# Patient Record
Sex: Female | Born: 1960 | Race: Black or African American | Hispanic: No | Marital: Married | Smoking: Current every day smoker
Health system: Southern US, Community
[De-identification: ages and names within clinical notes are randomized; demographics above are authoritative.]

## PROBLEM LIST (undated history)

## (undated) DIAGNOSIS — Z8489 Family history of other specified conditions: Secondary | ICD-10-CM

## (undated) DIAGNOSIS — K760 Fatty (change of) liver, not elsewhere classified: Secondary | ICD-10-CM

## (undated) DIAGNOSIS — D649 Anemia, unspecified: Secondary | ICD-10-CM

## (undated) DIAGNOSIS — K219 Gastro-esophageal reflux disease without esophagitis: Secondary | ICD-10-CM

## (undated) DIAGNOSIS — G4730 Sleep apnea, unspecified: Secondary | ICD-10-CM

## (undated) DIAGNOSIS — M1990 Unspecified osteoarthritis, unspecified site: Secondary | ICD-10-CM

## (undated) DIAGNOSIS — I10 Essential (primary) hypertension: Secondary | ICD-10-CM

## (undated) DIAGNOSIS — T8859XA Other complications of anesthesia, initial encounter: Secondary | ICD-10-CM

---

## 2005-10-30 ENCOUNTER — Ambulatory Visit: Payer: Self-pay | Admitting: Family Medicine

## 2011-03-03 ENCOUNTER — Emergency Department: Payer: Self-pay | Admitting: Emergency Medicine

## 2013-01-24 ENCOUNTER — Emergency Department: Payer: Self-pay | Admitting: Emergency Medicine

## 2014-07-07 ENCOUNTER — Emergency Department: Payer: Self-pay | Admitting: Emergency Medicine

## 2014-09-09 ENCOUNTER — Emergency Department: Payer: Self-pay | Admitting: Emergency Medicine

## 2014-09-09 LAB — URINALYSIS, COMPLETE
BLOOD: NEGATIVE
Bacteria: NONE SEEN
Bilirubin,UR: NEGATIVE
Glucose,UR: NEGATIVE mg/dL (ref 0–75)
Leukocyte Esterase: NEGATIVE
NITRITE: NEGATIVE
PH: 5 (ref 4.5–8.0)
PROTEIN: NEGATIVE
RBC,UR: 1 /HPF (ref 0–5)
Specific Gravity: 1.016 (ref 1.003–1.030)
Squamous Epithelial: 1

## 2014-09-09 LAB — COMPREHENSIVE METABOLIC PANEL
ALBUMIN: 3.8 g/dL (ref 3.4–5.0)
Alkaline Phosphatase: 101 U/L
Anion Gap: 6 — ABNORMAL LOW (ref 7–16)
BUN: 25 mg/dL — AB (ref 7–18)
Bilirubin,Total: 0.6 mg/dL (ref 0.2–1.0)
CALCIUM: 9.6 mg/dL (ref 8.5–10.1)
Chloride: 99 mmol/L (ref 98–107)
Co2: 30 mmol/L (ref 21–32)
Creatinine: 0.96 mg/dL (ref 0.60–1.30)
GLUCOSE: 141 mg/dL — AB (ref 65–99)
OSMOLALITY: 277 (ref 275–301)
POTASSIUM: 3.6 mmol/L (ref 3.5–5.1)
SGOT(AST): 40 U/L — ABNORMAL HIGH (ref 15–37)
SGPT (ALT): 28 U/L
Sodium: 135 mmol/L — ABNORMAL LOW (ref 136–145)
TOTAL PROTEIN: 8 g/dL (ref 6.4–8.2)

## 2014-09-09 LAB — CBC
HCT: 33.7 % — ABNORMAL LOW (ref 35.0–47.0)
HGB: 11.4 g/dL — AB (ref 12.0–16.0)
MCH: 36.7 pg — AB (ref 26.0–34.0)
MCHC: 33.9 g/dL (ref 32.0–36.0)
MCV: 108 fL — AB (ref 80–100)
Platelet: 213 10*3/uL (ref 150–440)
RBC: 3.11 10*6/uL — AB (ref 3.80–5.20)
RDW: 12.7 % (ref 11.5–14.5)
WBC: 7.6 10*3/uL (ref 3.6–11.0)

## 2014-09-09 LAB — HEMOGLOBIN: HGB: 11.2 g/dL — ABNORMAL LOW (ref 12.0–16.0)

## 2016-03-07 ENCOUNTER — Other Ambulatory Visit: Payer: Self-pay | Admitting: Family Medicine

## 2016-03-07 DIAGNOSIS — R319 Hematuria, unspecified: Secondary | ICD-10-CM

## 2016-03-19 ENCOUNTER — Ambulatory Visit: Admission: RE | Admit: 2016-03-19 | Payer: Federal, State, Local not specified - PPO | Source: Ambulatory Visit

## 2016-03-27 ENCOUNTER — Ambulatory Visit
Admission: RE | Admit: 2016-03-27 | Discharge: 2016-03-27 | Disposition: A | Discharge status: Home / Self Care | Payer: Federal, State, Local not specified - PPO | Source: Ambulatory Visit | Attending: Family Medicine | Admitting: Family Medicine

## 2016-03-27 DIAGNOSIS — R160 Hepatomegaly, not elsewhere classified: Secondary | ICD-10-CM | POA: Insufficient documentation

## 2016-03-27 DIAGNOSIS — I700 Atherosclerosis of aorta: Secondary | ICD-10-CM | POA: Diagnosis not present

## 2016-03-27 DIAGNOSIS — R319 Hematuria, unspecified: Secondary | ICD-10-CM | POA: Diagnosis not present

## 2016-03-27 DIAGNOSIS — I2510 Atherosclerotic heart disease of native coronary artery without angina pectoris: Secondary | ICD-10-CM | POA: Diagnosis not present

## 2016-03-27 DIAGNOSIS — K760 Fatty (change of) liver, not elsewhere classified: Secondary | ICD-10-CM | POA: Diagnosis not present

## 2016-03-27 DIAGNOSIS — K8020 Calculus of gallbladder without cholecystitis without obstruction: Secondary | ICD-10-CM | POA: Diagnosis not present

## 2016-03-27 DIAGNOSIS — D3502 Benign neoplasm of left adrenal gland: Secondary | ICD-10-CM | POA: Insufficient documentation

## 2016-03-27 HISTORY — DX: Essential (primary) hypertension: I10

## 2016-03-27 MED ORDER — IOPAMIDOL (ISOVUE-300) INJECTION 61%
125.0000 mL | Freq: Once | INTRAVENOUS | Status: AC | PRN
  Administered 2016-03-27: 125 mL via INTRAVENOUS

## 2020-06-27 ENCOUNTER — Emergency Department: Payer: Federal, State, Local not specified - PPO

## 2020-06-27 ENCOUNTER — Encounter: Payer: Self-pay | Admitting: Emergency Medicine

## 2020-06-27 ENCOUNTER — Inpatient Hospital Stay
Admission: EM | Admit: 2020-06-27 | Discharge: 2020-07-07 | DRG: 871 | Disposition: A | Discharge status: Home Health | Payer: Federal, State, Local not specified - PPO | Attending: Internal Medicine | Admitting: Internal Medicine

## 2020-06-27 ENCOUNTER — Other Ambulatory Visit: Payer: Self-pay

## 2020-06-27 DIAGNOSIS — J9600 Acute respiratory failure, unspecified whether with hypoxia or hypercapnia: Secondary | ICD-10-CM

## 2020-06-27 DIAGNOSIS — E876 Hypokalemia: Secondary | ICD-10-CM | POA: Diagnosis not present

## 2020-06-27 DIAGNOSIS — K831 Obstruction of bile duct: Secondary | ICD-10-CM

## 2020-06-27 DIAGNOSIS — A4151 Sepsis due to Escherichia coli [E. coli]: Secondary | ICD-10-CM | POA: Diagnosis present

## 2020-06-27 DIAGNOSIS — K819 Cholecystitis, unspecified: Secondary | ICD-10-CM

## 2020-06-27 DIAGNOSIS — D6959 Other secondary thrombocytopenia: Secondary | ICD-10-CM | POA: Diagnosis present

## 2020-06-27 DIAGNOSIS — I10 Essential (primary) hypertension: Secondary | ICD-10-CM | POA: Diagnosis present

## 2020-06-27 DIAGNOSIS — E669 Obesity, unspecified: Secondary | ICD-10-CM | POA: Diagnosis present

## 2020-06-27 DIAGNOSIS — Z6839 Body mass index (BMI) 39.0-39.9, adult: Secondary | ICD-10-CM

## 2020-06-27 DIAGNOSIS — N2581 Secondary hyperparathyroidism of renal origin: Secondary | ICD-10-CM | POA: Diagnosis present

## 2020-06-27 DIAGNOSIS — R5381 Other malaise: Secondary | ICD-10-CM | POA: Diagnosis present

## 2020-06-27 DIAGNOSIS — N170 Acute kidney failure with tubular necrosis: Secondary | ICD-10-CM

## 2020-06-27 DIAGNOSIS — K760 Fatty (change of) liver, not elsewhere classified: Secondary | ICD-10-CM | POA: Diagnosis present

## 2020-06-27 DIAGNOSIS — E871 Hypo-osmolality and hyponatremia: Secondary | ICD-10-CM | POA: Diagnosis not present

## 2020-06-27 DIAGNOSIS — E785 Hyperlipidemia, unspecified: Secondary | ICD-10-CM | POA: Diagnosis present

## 2020-06-27 DIAGNOSIS — E872 Acidosis: Secondary | ICD-10-CM | POA: Diagnosis present

## 2020-06-27 DIAGNOSIS — R34 Anuria and oliguria: Secondary | ICD-10-CM | POA: Diagnosis not present

## 2020-06-27 DIAGNOSIS — I361 Nonrheumatic tricuspid (valve) insufficiency: Secondary | ICD-10-CM | POA: Diagnosis not present

## 2020-06-27 DIAGNOSIS — R627 Adult failure to thrive: Secondary | ICD-10-CM | POA: Diagnosis present

## 2020-06-27 DIAGNOSIS — D6489 Other specified anemias: Secondary | ICD-10-CM | POA: Diagnosis not present

## 2020-06-27 DIAGNOSIS — A4159 Other Gram-negative sepsis: Secondary | ICD-10-CM | POA: Diagnosis present

## 2020-06-27 DIAGNOSIS — Z20822 Contact with and (suspected) exposure to covid-19: Secondary | ICD-10-CM | POA: Diagnosis present

## 2020-06-27 DIAGNOSIS — R6521 Severe sepsis with septic shock: Secondary | ICD-10-CM | POA: Diagnosis present

## 2020-06-27 DIAGNOSIS — E2740 Unspecified adrenocortical insufficiency: Secondary | ICD-10-CM | POA: Diagnosis present

## 2020-06-27 DIAGNOSIS — R739 Hyperglycemia, unspecified: Secondary | ICD-10-CM | POA: Diagnosis not present

## 2020-06-27 DIAGNOSIS — R17 Unspecified jaundice: Secondary | ICD-10-CM

## 2020-06-27 DIAGNOSIS — T380X5A Adverse effect of glucocorticoids and synthetic analogues, initial encounter: Secondary | ICD-10-CM | POA: Diagnosis not present

## 2020-06-27 DIAGNOSIS — I351 Nonrheumatic aortic (valve) insufficiency: Secondary | ICD-10-CM | POA: Diagnosis not present

## 2020-06-27 DIAGNOSIS — R6520 Severe sepsis without septic shock: Secondary | ICD-10-CM | POA: Diagnosis not present

## 2020-06-27 DIAGNOSIS — Z9884 Bariatric surgery status: Secondary | ICD-10-CM | POA: Diagnosis not present

## 2020-06-27 DIAGNOSIS — K8012 Calculus of gallbladder with acute and chronic cholecystitis without obstruction: Secondary | ICD-10-CM | POA: Diagnosis present

## 2020-06-27 DIAGNOSIS — I340 Nonrheumatic mitral (valve) insufficiency: Secondary | ICD-10-CM | POA: Diagnosis not present

## 2020-06-27 DIAGNOSIS — Z7189 Other specified counseling: Secondary | ICD-10-CM | POA: Diagnosis not present

## 2020-06-27 DIAGNOSIS — Z515 Encounter for palliative care: Secondary | ICD-10-CM | POA: Diagnosis not present

## 2020-06-27 DIAGNOSIS — R7881 Bacteremia: Secondary | ICD-10-CM | POA: Diagnosis not present

## 2020-06-27 DIAGNOSIS — N179 Acute kidney failure, unspecified: Secondary | ICD-10-CM

## 2020-06-27 DIAGNOSIS — Z23 Encounter for immunization: Secondary | ICD-10-CM | POA: Diagnosis not present

## 2020-06-27 DIAGNOSIS — A419 Sepsis, unspecified organism: Secondary | ICD-10-CM | POA: Diagnosis not present

## 2020-06-27 DIAGNOSIS — E782 Mixed hyperlipidemia: Secondary | ICD-10-CM | POA: Diagnosis not present

## 2020-06-27 DIAGNOSIS — K810 Acute cholecystitis: Secondary | ICD-10-CM

## 2020-06-27 LAB — APTT: aPTT: 23 seconds — ABNORMAL LOW (ref 24–36)

## 2020-06-27 LAB — COMPREHENSIVE METABOLIC PANEL
ALT: 731 U/L — ABNORMAL HIGH (ref 0–44)
AST: 6003 U/L — ABNORMAL HIGH (ref 15–41)
Albumin: 3.3 g/dL — ABNORMAL LOW (ref 3.5–5.0)
Alkaline Phosphatase: 329 U/L — ABNORMAL HIGH (ref 38–126)
Anion gap: 16 — ABNORMAL HIGH (ref 5–15)
BUN: 16 mg/dL (ref 8–23)
CO2: 19 mmol/L — ABNORMAL LOW (ref 22–32)
Calcium: 8.2 mg/dL — ABNORMAL LOW (ref 8.9–10.3)
Chloride: 103 mmol/L (ref 98–111)
Creatinine, Ser: 1.88 mg/dL — ABNORMAL HIGH (ref 0.44–1.00)
GFR, Estimated: 28 mL/min — ABNORMAL LOW (ref >60)
Glucose, Bld: 82 mg/dL (ref 70–99)
Potassium: 4.6 mmol/L (ref 3.5–5.1)
Sodium: 138 mmol/L (ref 135–145)
Total Bilirubin: 1.9 mg/dL — ABNORMAL HIGH (ref 0.3–1.2)
Total Protein: 6.5 g/dL (ref 6.5–8.1)

## 2020-06-27 LAB — FIBRIN DERIVATIVES D-DIMER (ARMC ONLY): Fibrin derivatives D-dimer (ARMC): 3979.26 ng/mL (FEU) — ABNORMAL HIGH (ref 0.00–499.00)

## 2020-06-27 LAB — LACTIC ACID, PLASMA
Lactic Acid, Venous: 5.2 mmol/L (ref 0.5–1.9)
Lactic Acid, Venous: 6.6 mmol/L (ref 0.5–1.9)

## 2020-06-27 LAB — DIFFERENTIAL
Abs Immature Granulocytes: 0.2 10*3/uL — ABNORMAL HIGH (ref 0.00–0.07)
Basophils Absolute: 0 10*3/uL (ref 0.0–0.1)
Basophils Relative: 1 %
Eosinophils Absolute: 0 10*3/uL (ref 0.0–0.5)
Eosinophils Relative: 0 %
Immature Granulocytes: 19 %
Lymphocytes Relative: 11 %
Lymphs Abs: 0.1 10*3/uL — ABNORMAL LOW (ref 0.7–4.0)
Monocytes Absolute: 0.1 10*3/uL (ref 0.1–1.0)
Monocytes Relative: 5 %
Neutro Abs: 0.7 10*3/uL — ABNORMAL LOW (ref 1.7–7.7)
Neutrophils Relative %: 64 %

## 2020-06-27 LAB — LIPASE, BLOOD: Lipase: 30 U/L (ref 11–51)

## 2020-06-27 LAB — RESPIRATORY PANEL BY RT PCR (FLU A&B, COVID)
Influenza A by PCR: NEGATIVE
Influenza B by PCR: NEGATIVE
SARS Coronavirus 2 by RT PCR: NEGATIVE

## 2020-06-27 LAB — CBC
HCT: 31.7 % — ABNORMAL LOW (ref 36.0–46.0)
Hemoglobin: 10.9 g/dL — ABNORMAL LOW (ref 12.0–15.0)
MCH: 36.3 pg — ABNORMAL HIGH (ref 26.0–34.0)
MCHC: 34.4 g/dL (ref 30.0–36.0)
MCV: 105.7 fL — ABNORMAL HIGH (ref 80.0–100.0)
Platelets: 191 10*3/uL (ref 150–400)
RBC: 3 MIL/uL — ABNORMAL LOW (ref 3.87–5.11)
RDW: 14.8 % (ref 11.5–15.5)
WBC: 1.1 10*3/uL — CL (ref 4.0–10.5)
nRBC: 1.9 % — ABNORMAL HIGH (ref 0.0–0.2)

## 2020-06-27 LAB — PROTIME-INR
INR: 1.1 (ref 0.8–1.2)
Prothrombin Time: 14.1 seconds (ref 11.4–15.2)

## 2020-06-27 MED ORDER — SODIUM CHLORIDE 0.9 % IV SOLN
250.0000 mL | INTRAVENOUS | Status: DC
  Administered 2020-06-27 – 2020-07-05 (×4): 250 mL via INTRAVENOUS

## 2020-06-27 MED ORDER — POLYETHYLENE GLYCOL 3350 17 G PO PACK: 17.0000 g | PACK | Freq: Every day | ORAL | Status: DC | PRN

## 2020-06-27 MED ORDER — LACTATED RINGERS IV BOLUS
1000.0000 mL | Freq: Once | INTRAVENOUS | Status: AC
  Administered 2020-06-27: 1000 mL via INTRAVENOUS

## 2020-06-27 MED ORDER — ONDANSETRON HCL 4 MG/2ML IJ SOLN
4.0000 mg | Freq: Four times a day (QID) | INTRAMUSCULAR | Status: DC | PRN
  Administered 2020-06-28: 4 mg via INTRAVENOUS
  Filled 2020-06-27: qty 2

## 2020-06-27 MED ORDER — PIPERACILLIN-TAZOBACTAM 3.375 G IVPB
3.3750 g | Freq: Three times a day (TID) | INTRAVENOUS | Status: DC
  Administered 2020-06-28 – 2020-06-30 (×8): 3.375 g via INTRAVENOUS
  Filled 2020-06-27 (×8): qty 50

## 2020-06-27 MED ORDER — PANTOPRAZOLE SODIUM 40 MG IV SOLR
40.0000 mg | Freq: Every day | INTRAVENOUS | Status: DC
  Administered 2020-06-28 – 2020-07-03 (×7): 40 mg via INTRAVENOUS
  Filled 2020-06-27 (×7): qty 40

## 2020-06-27 MED ORDER — LACTATED RINGERS IV BOLUS
1500.0000 mL | Freq: Once | INTRAVENOUS | Status: AC
  Administered 2020-06-27: 1500 mL via INTRAVENOUS

## 2020-06-27 MED ORDER — ALBUMIN HUMAN 25 % IV SOLN
25.0000 g | Freq: Once | INTRAVENOUS | Status: AC
  Administered 2020-06-28: 25 g via INTRAVENOUS
  Filled 2020-06-27: qty 100

## 2020-06-27 MED ORDER — NOREPINEPHRINE 4 MG/250ML-% IV SOLN
2.0000 ug/min | INTRAVENOUS | Status: DC
  Administered 2020-06-27: 2 ug/min via INTRAVENOUS
  Filled 2020-06-27 (×2): qty 250

## 2020-06-27 MED ORDER — DOCUSATE SODIUM 100 MG PO CAPS: 100.0000 mg | ORAL_CAPSULE | Freq: Two times a day (BID) | ORAL | Status: DC | PRN

## 2020-06-27 MED ORDER — SODIUM CHLORIDE 0.9 % IV SOLN
2.0000 g | Freq: Once | INTRAVENOUS | Status: AC
  Administered 2020-06-27: 2 g via INTRAVENOUS
  Filled 2020-06-27: qty 20

## 2020-06-27 MED ORDER — METRONIDAZOLE IN NACL 5-0.79 MG/ML-% IV SOLN
500.0000 mg | Freq: Once | INTRAVENOUS | Status: AC
  Administered 2020-06-27: 500 mg via INTRAVENOUS
  Filled 2020-06-27: qty 100

## 2020-06-27 MED ORDER — LACTATED RINGERS IV SOLN: INTRAVENOUS | Status: DC

## 2020-06-27 MED ORDER — LACTATED RINGERS IV BOLUS
1000.0000 mL | Freq: Once | INTRAVENOUS | Status: AC
  Administered 2020-06-28: 1000 mL via INTRAVENOUS

## 2020-06-27 MED ORDER — IOHEXOL 300 MG/ML  SOLN
75.0000 mL | Freq: Once | INTRAMUSCULAR | Status: AC | PRN
  Administered 2020-06-27: 75 mL via INTRAVENOUS

## 2020-06-27 NOTE — ED Notes (Signed)
US at bedside

## 2020-06-27 NOTE — ED Provider Notes (Signed)
[facility] Emergency Department Provider Note  ____________________________________________  Time seen: Approximately 7:08 PM  I have reviewed the triage vital signs and the nursing notes.   HISTORY  Chief Complaint Dizziness    Level 5 Caveat: Portions of the History and Physical including HPI and review of systems are unable to be completely obtained due to patient being a poor historian   HPI Carrie Hardy is a 59 y.o. female with a history of hypertension who reports being in her usual state of health last night at bedtime and then waking up this morning feeling lightheaded.  Found to have a low blood pressure of about 70/50.  She denies any recent pain, denies vomiting or diarrhea, reports eating and drinking normally.  Lightheadedness has caused her to feel like she was going to pass out and fall, but denies any trauma.  This morning she was also having slurred speech which has now resolved.  Denies any vision changes headache paresthesias or lateralizing weakness.      Past Medical History:  Diagnosis Date  . Hypertension      There are no problems to display for this patient.    History reviewed. No pertinent surgical history.   Prior to Admission medications   Not on File     Allergies Tramadol   No family history on file.  Social History Social History   Tobacco Use  . Smoking status: Never Smoker  . Smokeless tobacco: Never Used  Substance Use Topics  . Alcohol use: Not Currently  . Drug use: Not Currently    Review of Systems Level 5 Caveat: Portions of the History and Physical including HPI and review of systems are unable to be completely obtained due to patient being a poor historian   Constitutional:   No known fever.  ENT:   No rhinorrhea. Cardiovascular:   No chest pain or syncope. Respiratory:   No dyspnea or cough. Gastrointestinal:   Negative for abdominal pain, vomiting and diarrhea.   Musculoskeletal:   Negative for focal pain or swelling ____________________________________________   PHYSICAL EXAM:  VITAL SIGNS: ED Triage Vitals  Enc Vitals Group     BP 06/27/20 1752 (!) 71/47     Pulse Rate 06/27/20 1752 (!) 120     Resp 06/27/20 1752 16     Temp 06/27/20 1752 98.4 F (36.9 C)     Temp Source 06/27/20 1752 Oral     SpO2 06/27/20 1752 99 %     Weight 06/27/20 1733 180 lb (81.6 kg)     Height 06/27/20 1733 5\' 5"  (1.651 m)     Head Circumference --      Peak Flow --      Pain Score 06/27/20 1733 0     Pain Loc --      Pain Edu? --      Excl. in [place]? --     Vital signs reviewed, nursing assessments reviewed.   Constitutional:   Alert and oriented. Non-toxic appearance. Eyes:   Conjunctivae are normal. EOMI. PERRL. ENT      Head:   Normocephalic and atraumatic.      Nose:   No congestion/rhinnorhea.       Mouth/Throat:   Dry mucous membranes, no pharyngeal erythema. No peritonsillar mass.       Neck:   No meningismus. Full ROM. Hematological/Lymphatic/Immunilogical:   No cervical lymphadenopathy. Cardiovascular:   Tachycardia rate 120. Symmetric bilateral radial and DP pulses.  No murmurs. Cap refill  less than 2 seconds. Respiratory: Tachypnea, respiratory rate of 24.  No wheezes or crackles, symmetric air movement.. Gastrointestinal:   Soft and nontender. Non distended. There is no CVA tenderness.  No rebound, rigidity, or guarding.  Musculoskeletal:   Normal range of motion in all extremities. No joint effusions.  No lower extremity tenderness.  No edema. Neurologic:   Normal speech and language.  Motor grossly intact. No acute focal neurologic deficits are appreciated.  Skin:    Skin is warm, dry and intact. No rash noted.  No petechiae, purpura, or bullae.  ____________________________________________    LABS (pertinent positives/negatives) (all labs ordered are listed, but only abnormal results are displayed) Labs Reviewed  CBC - Abnormal;  Notable for the following components:      Result Value   WBC 1.1 (*)    RBC 3.00 (*)    Hemoglobin 10.9 (*)    HCT 31.7 (*)    MCV 105.7 (*)    MCH 36.3 (*)    nRBC 1.9 (*)    All other components within normal limits  DIFFERENTIAL - Abnormal; Notable for the following components:   Neutro Abs 0.7 (*)    Lymphs Abs 0.1 (*)    Abs Immature Granulocytes 0.20 (*)    All other components within normal limits  COMPREHENSIVE METABOLIC PANEL - Abnormal; Notable for the following components:   CO2 19 (*)    Creatinine, Ser 1.88 (*)    Calcium 8.2 (*)    Albumin 3.3 (*)    AST 6,003 (*)    ALT 731 (*)    Alkaline Phosphatase 329 (*)    Total Bilirubin 1.9 (*)    GFR, Estimated 28 (*)    Anion gap 16 (*)    All other components within normal limits  APTT - Abnormal; Notable for the following components:   aPTT 23 (*)    All other components within normal limits  LACTIC ACID, PLASMA - Abnormal; Notable for the following components:   Lactic Acid, Venous 5.2 (*)    All other components within normal limits  LACTIC ACID, PLASMA - Abnormal; Notable for the following components:   Lactic Acid, Venous 6.6 (*)    All other components within normal limits  FIBRIN DERIVATIVES D-DIMER ([facility] ONLY) - Abnormal; Notable for the following components:   Fibrin derivatives D-dimer [facility]) [phone] (*)    All other components within normal limits  RESPIRATORY PANEL BY RT PCR (FLU A&B, COVID)  CULTURE, BLOOD (SINGLE)  URINE CULTURE  PROTIME-INR  LIPASE, BLOOD  URINALYSIS, COMPLETE (UACMP) WITH MICROSCOPIC  PATHOLOGIST SMEAR REVIEW  HIV ANTIBODY (ROUTINE TESTING W REFLEX)  ACETAMINOPHEN LEVEL  TECHNOLOGIST SMEAR REVIEW  CBG MONITORING, ED   ____________________________________________   EKG  Interpreted by me Sinus tachycardia rate 146.  Normal axis and intervals.  Normal QRS ST segments and T waves.  ____________________________________________    RADIOLOGY  CT HEAD WO  CONTRAST  Result Date: 06/27/2020 CLINICAL DATA:  Lightheaded. EXAM: CT HEAD WITHOUT CONTRAST TECHNIQUE: Contiguous axial images were obtained from the base of the skull through the vertex without intravenous contrast. COMPARISON:  None. FINDINGS: Brain: There is mild cerebral atrophy with widening of the extra-axial spaces and ventricular dilatation. There are areas of decreased attenuation within the white matter tracts of the supratentorial brain, consistent with microvascular disease changes. Vascular: No hyperdense vessel or unexpected calcification. Skull: Normal. Negative for fracture or focal lesion. Sinuses/Orbits: No acute finding. Other: None. IMPRESSION: No acute intracranial pathology. Electronically  Signed   By: Virgina Norfolk M.D.   On: 06/27/2020 18:10   DG Chest Port 1 View  Result Date: 06/27/2020 CLINICAL DATA:  Possible sepsis EXAM: PORTABLE CHEST 1 VIEW COMPARISON:  None. FINDINGS: Minimal atelectasis left base. No consolidation or effusion. Normal heart size. Aortic atherosclerosis. No pneumothorax. IMPRESSION: No active disease. Electronically Signed   By: Donavan Foil M.D.   On: 06/27/2020 19:00    ____________________________________________   PROCEDURES .Critical Care Performed by: Carrie Mew, MD Authorized by: Carrie Mew, MD   Critical care provider statement:    Critical care time (minutes):  50   Critical care time was exclusive of:  Separately billable procedures and treating other patients   Critical care was necessary to treat or prevent imminent or life-threatening deterioration of the following conditions:  Sepsis and shock   Critical care was time spent personally by me on the following activities:  Development of treatment plan with patient or surrogate, discussions with consultants, evaluation of patient's response to treatment, examination of patient, obtaining history from patient or surrogate, ordering and performing treatments and  interventions, ordering and review of laboratory studies, ordering and review of radiographic studies, pulse oximetry, re-evaluation of patient's condition and review of old charts    ____________________________________________  DIFFERENTIAL DIAGNOSIS   Dehydration, Covid, pneumonia, UTI, sepsis, AKI, electrolyte abnormality  CLINICAL IMPRESSION / ASSESSMENT AND PLAN / ED COURSE  Medications ordered in the ED: Medications  0.9 %  sodium chloride infusion (250 mLs Intravenous New Bag/Given 06/27/20 2111)  norepinephrine (LEVOPHED) 4mg  in 277mL premix infusion (8 mcg/min Intravenous Rate/Dose Change 06/27/20 2243)  iohexol (OMNIPAQUE) 300 MG/ML solution 75 mL (has no administration in time range)  lactated ringers bolus 1,500 mL (0 mLs Intravenous Stopped 06/27/20 2053)  cefTRIAXone (ROCEPHIN) 2 g in sodium chloride 0.9 % 100 mL IVPB (0 g Intravenous Stopped 06/27/20 2053)  metroNIDAZOLE (FLAGYL) IVPB 500 mg (0 mg Intravenous Stopped 06/27/20 2225)  lactated ringers bolus 1,000 mL (0 mLs Intravenous Stopped 06/27/20 2225)    Pertinent labs & imaging results that were available during my care of the patient were reviewed by me and considered in my medical decision making (see chart for details).   Carrie Hardy was evaluated in Emergency Department on 06/27/2020 for the symptoms described in the history of present illness. She was evaluated in the context of the global COVID-19 pandemic, which necessitated consideration that the patient might be at risk for infection with the SARS-CoV-2 virus that causes COVID-19. Institutional protocols and algorithms that pertain to the evaluation of patients at risk for COVID-19 are in a state of rapid change based on information released by regulatory bodies including the CDC and federal and state organizations. These policies and algorithms were followed during the patient's care in the ED.   Patient presents with tachycardia tachypnea hypotension  with only symptom of lightheadedness and some facial droop earlier today which resolved.  Concerning for shock, will start 30 cc/kg fluid bolus.  Patient already received 1 L IV fluids by EMS, will give additional 1500 mL. obtain labs chest x-ray urinalysis Covid test.  ----------------------------------------- 7:25 PM on 06/27/2020 -----------------------------------------  Blood pressure improved with IV fluids.  Still persistently tachycardic.  Awaiting remainder of work-up.  Clinical Course as of Jun 28 2247  Tue Jun 27, 2020  1925 marked leukopenia.  Will order empiric ceftriaxone.  WBC(!!): 1.1 [PS]  2032 Still awaiting lab results for chemistry, lactate, d dimer   [PS]  2055 Sepsis  reassessment completed. Still hypotensive despite 26ml/kg IVF bolus (2.5L). will start levophed and give additional 1 L LR bolus.    [PS]  2056 Lactate 5.2. LFTs show pronounced transaminitis. Will obtain US ruq   [PS]  2227 Ultrasound images viewed by me contemporaneously while being obtained, shows clear cholecystitis with gallstones and emphysematous gallbladder wall with pericholecystic fluid.  CBD is not dilated.  Lipase is normal.  No evidence of choledocholithiasis, will page surgery.   [PS]  2244 Case discussed with Dr. Dahlia Byes who recommends contrast-enhanced CT scan to help plan surgical intervention versus percutaneous drain.  Discussed with radiology Dr. Gerilyn Nestle and nephrology Dr. Candiss Norse who both agree that risk-benefit analysis favors performing the scan despite the risk of contrast-induced nephropathy due to patient's critical illness and need for expeditious intervention.   [PS]    Clinical Course User Index [PS] Carrie Mew, MD     ____________________________________________   FINAL CLINICAL IMPRESSION(S) / ED DIAGNOSES    Final diagnoses:  Acute cholecystitis  Septic shock [facility])     ED Discharge Orders    None      Portions of this note were generated with dragon  dictation software. Dictation errors may occur despite best attempts at proofreading.   Carrie Mew, MD 06/27/20 2248

## 2020-06-27 NOTE — H&P (Signed)
NAME:  Carrie Hardy, MRN:  [id number], DOB:  October 21, 1960, LOS: 0 ADMISSION DATE:  06/27/2020, CONSULTATION DATE:  06/27/2020 REFERRING MD:  Dr. Joni Fears, CHIEF COMPLAINT: Dizziness  Brief History   59 yo female presenting to the ED with severe sepsis due to cholecystitis requiring vasopressors.  History of present illness   59 yo female presenting to the ED after waking up on 06/27/20 and feeling lightheaded with some slurred speech and one episode of nausea.  She reports being in her normal state of health up to this morning, denies fever/ chills/ vomiting/ diarrhea/ shortness of breath/ fatigue/weight loss or gain.  Upon arrival to the ED patient was hypotensive: 70/50, tachycardic, maintaining SpO2 on room air. ED completed sepsis protocol and workup included labs significant for: serum CO2 19, Cr. 1.88, AG 16, Alk Phos 329, AST 6,003, ALT 731, Total bili 1.9, WBC 1.1, Lactic 5.2 ~ 6.6.  Abdominal US showed cholelithiasis and concerns for cholecystitis. Dr. Joni Fears discussed case with Dr. Dahlia Byes in general surgery with recommendations for CT abdomen, results showing gallbladder distention with multiple gallstones.  CT results relayed to Dr. Dahlia Byes with further recommendations for MRCP and GI involvement with biliary drain placement.  Patient requiring low dose levophed drip.  PCCM consulted for further management, admitted to ICU.  Past Medical History  HTN HLD NAFLD  Significant Hospital Events   10/19 Admit to ICU  Consults:  GI   Procedures:  10/20 CVC 3L >>  Significant Diagnostic Tests:  10/19 US abdomen > Cholelithiasis with gallbladder wall thickening/edema and a small amount of pericholecystic fluid. Diffuse echogenic liver with steatosis/hepatocellular disease and a small amount of RUQ ascites.  10/19 CT abdomen > Dilated gallbladder with wall thickening and pericholecystic inflammatory change in cholelithiasis with multiple dependent gallstones, no biliary ductal  dilatation seen.  Micro Data:  10/19 COVID-19 >> negative 10/19 Influenza A/B >> negative 10/19 UA >> negative 10/19 BC >> 10/19 UC >>  Antimicrobials:  10/19 Ceftriaxone x 1 10/19 Metronidazole x 1 10/19 Zosyn >>  Interim history/subjective:  Patient states she is tired of all the tests, and being a "[place] pig".  We discussed the importance of being thorough so we can treat her infection and get her better. Husband bedside  Labs/ Imaging personally reviewed Na+/ K+: 138/ 4.6 BUN/Cr.: 16/ 1.88 Hgb: 10.9 WBC/ TMAX: 1.1/ 36.9 Serum CO2: 19 Lactic: 5.2 ~ 6.6 AlkPhos: 329 AST: 6,003 ALT: 731 T. Bili: 1.9  VBG: 7.41/ 29/ 40/ 18.4, acid base deficit 5.2 CXR 10/19: No active disease, minimal left basilar atelectasis  Objective   Blood pressure (!) 84/64, pulse (!) 124, temperature 98.4 F (36.9 C), temperature source Oral, resp. rate (!) 26, height 5' 5"  (1.651 m), weight 81.6 kg, SpO2 97 %.       No intake or output data in the 24 hours ending 06/27/20 2256 Filed Weights   06/27/20 1733  Weight: 81.6 kg    Examination: General: Adult female, critically ill, lying in bed, NAD HEENT: MM pink/moist, mildly icteric, atraumatic, neck supple Neuro: alert & oriented, follows commands, PERRL +3, MAE, slight slurring of words (pt c/o dry mouth), facial symmetry intact CV: s1s2 RRR, ST on monitor, no r/m/g Pulm: Regular, non labored on room air, breath sounds clear throughout GI: soft, rounded, mild tenderness with palpation in RUQ, bs x 4 GU: deferred Skin: scattered ecchymosis, no rashes/lesions noted Extremities: warm/dry, pulses + 2 R/P, no edema noted  Resolved Hospital Problem list  Assessment & Plan:  Severe Sepsis Suspected Cholecystitis Lactic Acidosis Patient's main complaint was lightheadedness and fatigue with minimal RUQ tenderness to palpation.  Patient hypotensive, tachycardic with initial lactate 5.2 requiring pressors.  Abd. CT showing cholecystitis,  general surgery recommended biliary drain & MRCP with GI f/u. - Aggressive hydration with additional LR bolus ordered (3.5 L total) - continuous IVF started - Albumin ordered by general surgery - continue Zosyn per pharmacy for intraabdominal coverage - trend lactic, PCT, monitor WBC/fever curve - continue levophed to maintain MAP > 65, consider central line placement PRN - continuous cardiac monitoring - GI consulted appreciate input  Anion Gap Metabolic Acidosis Acute Renal Failure- Cr 1.88 On admission serum CO2 19, AG 16, bicarb on VBG 18.4 - sodium bicarbonate amp ordered, consider drip PRN - Strict I/O's: alert provider if UOP < 0.5 mL/kg/hr - Daily BMP, replace electrolytes PRN  Transaminitis secondary to suspected cholecystitis complicated by chronic hepatic steatosis On admission AST 6003, ALT 731, Alk Phos 329. Abdominal CT showed gallbladder with multiple gallstones and fatty liver infiltration noted. - trend hepatic function - IR consult for biliary drain placement  HTN HLD home medications: amlodipine and metoprolol succinate on hold in the setting of hypotension Will restart home lipitor once patient is tolerating PO meds  Best practice:  Diet: NPO Pain/Anxiety/Delirium protocol (if indicated): N/A VAP protocol (if indicated): N/A DVT prophylaxis: SCD's GI prophylaxis: protonix Glucose control: monitor, Q 4 CBG, target range 140-180 Mobility: bedrest while on pressors, mobilize as tolerated Code Status: FULL Family Communication: patient & husband updated bedside- 10/19 Disposition: ICU  Labs   CBC: Recent Labs  Lab 06/27/20 1735  WBC 1.1*  NEUTROABS 0.7*  HGB 10.9*  HCT 31.7*  MCV 105.7*  PLT 263    Basic Metabolic Panel: Recent Labs  Lab 06/27/20 1735  NA 138  K 4.6  CL 103  CO2 19*  GLUCOSE 82  BUN 16  CREATININE 1.88*  CALCIUM 8.2*   GFR: Estimated Creatinine Clearance: 33.1 mL/min (A) (by C-G formula based on SCr of 1.88 mg/dL  (H)). Recent Labs  Lab 06/27/20 1735 06/27/20 2000 06/27/20 2107  WBC 1.1*  --   --   LATICACIDVEN  --  5.2* 6.6*    Liver Function Tests: Recent Labs  Lab 06/27/20 1735  AST 6,003*  ALT 731*  ALKPHOS 329*  BILITOT 1.9*  PROT 6.5  ALBUMIN 3.3*   Recent Labs  Lab 06/27/20 1753  LIPASE 30   No results for input(s): AMMONIA in the last 168 hours.  ABG No results found for: PHART, PCO2ART, PO2ART, HCO3, TCO2, ACIDBASEDEF, O2SAT   Coagulation Profile: Recent Labs  Lab 06/27/20 1811  INR 1.1    Cardiac Enzymes: No results for input(s): CKTOTAL, CKMB, CKMBINDEX, TROPONINI in the last 168 hours.  HbA1C: No results found for: HGBA1C  CBG: No results for input(s): GLUCAP in the last 168 hours.  Review of Systems: positives in BOLD  Gen: Denies fever, chills, weight change, fatigue, night sweats, lightheadness HEENT: Denies blurred vision, double vision, hearing loss, tinnitus, sinus congestion, rhinorrhea, sore throat, neck stiffness, dysphagia PULM: Denies shortness of breath, cough, sputum production, hemoptysis, wheezing CV: Denies chest pain, edema, orthopnea, paroxysmal nocturnal dyspnea, palpitations GI: Denies abdominal pain, nausea, vomiting, diarrhea, hematochezia, melena, constipation, change in bowel habits GU: Denies dysuria, hematuria, polyuria, oliguria, urethral discharge Endocrine: Denies hot or cold intolerance, polyuria, polyphagia or appetite change Derm: Denies rash, dry skin, scaling or peeling skin change Heme: Denies  easy bruising, bleeding, bleeding gums Neuro: Denies headache, numbness, weakness, slurred speech, loss of memory or consciousness  Past Medical History  She,  has a past medical history of Hypertension.   Surgical History   History reviewed. No pertinent surgical history.   Social History   reports that she has never smoked. She has never used smokeless tobacco. She reports previous alcohol use. She reports previous drug  use.   Family History   Her family history is not on file.   Allergies Allergies  Allergen Reactions  . Tramadol Itching     Home Medications  Prior to Admission medications   Not on File     Critical care time: 45 minutes     [name] [place], AGACNP-BC [facility]    Please see Amion for pager details.

## 2020-06-27 NOTE — Progress Notes (Signed)
Pharmacy Antibiotic Note  Carrie Hardy is a 59 y.o. female admitted on 06/27/2020 with gangrenous cholecystitis.  Pharmacy has been consulted for Zosyn  dosing.  Plan: Zosyn 3.375g IV q8h (4 hour infusion).  Height: 5\' 5"  (165.1 cm) Weight: 81.6 kg (180 lb) IBW/kg (Calculated) : 57  Temp (24hrs), Avg:98.4 F (36.9 C), Min:98.4 F (36.9 C), Max:98.4 F (36.9 C)  Recent Labs  Lab 06/27/20 1735 06/27/20 2000 06/27/20 2107  WBC 1.1*  --   --   CREATININE 1.88*  --   --   LATICACIDVEN  --  5.2* 6.6*    Estimated Creatinine Clearance: 33.1 mL/min (A) (by C-G formula based on SCr of 1.88 mg/dL (H)).    Allergies  Allergen Reactions  . Tramadol Itching    Antimicrobials this admission:   >>    >>   Dose adjustments this admission:   Microbiology results:  BCx:   UCx:    Sputum:    MRSA PCR:   Thank you for allowing pharmacy to be a part of this patient's care.  Zhamir Pirro D 06/27/2020 11:13 PM

## 2020-06-27 NOTE — ED Triage Notes (Signed)
Pt states awoke this morning feeling light headed, states awoke with symptoms, states last time she felt normal was last night at approx midnight. Pt also c/o slurred speech that she noticed this morning upon awakening. Pt states felt fine when she went to bed last night. Pt with mild R sided facial droop noted on assessment, pt with some slurred speech, pt's son endorses slurred speech. Pt with equal grip strengths bilaterally, no drift noted at this time, denies any numbness. Pt A&O x4.    While attempting to obtain VS, pt noted to be hypotensive, initial BP 62 systolic, repeat 32 systolic, EKG HR 379, pt also noted to be diaphoretic and clammy to the touch. Pt taken back to room 10 by this RN.

## 2020-06-27 NOTE — ED Notes (Signed)
Patient transported to and from CT.

## 2020-06-28 ENCOUNTER — Inpatient Hospital Stay: Payer: Federal, State, Local not specified - PPO

## 2020-06-28 DIAGNOSIS — A419 Sepsis, unspecified organism: Secondary | ICD-10-CM | POA: Insufficient documentation

## 2020-06-28 LAB — CBC
HCT: 26.7 % — ABNORMAL LOW (ref 36.0–46.0)
Hemoglobin: 9.3 g/dL — ABNORMAL LOW (ref 12.0–15.0)
MCH: 37.5 pg — ABNORMAL HIGH (ref 26.0–34.0)
MCHC: 34.8 g/dL (ref 30.0–36.0)
MCV: 107.7 fL — ABNORMAL HIGH (ref 80.0–100.0)
Platelets: 142 10*3/uL — ABNORMAL LOW (ref 150–400)
RBC: 2.48 MIL/uL — ABNORMAL LOW (ref 3.87–5.11)
RDW: 15 % (ref 11.5–15.5)
WBC: 6 10*3/uL (ref 4.0–10.5)
nRBC: 0.8 % — ABNORMAL HIGH (ref 0.0–0.2)

## 2020-06-28 LAB — BLOOD GAS, VENOUS
Acid-base deficit: 5.2 mmol/L — ABNORMAL HIGH (ref 0.0–2.0)
Bicarbonate: 18.4 mmol/L — ABNORMAL LOW (ref 20.0–28.0)
O2 Saturation: 75.5 %
Patient temperature: 37
pCO2, Ven: 29 mmHg — ABNORMAL LOW (ref 44.0–60.0)
pH, Ven: 7.41 (ref 7.250–7.430)
pO2, Ven: 40 mmHg (ref 32.0–45.0)

## 2020-06-28 LAB — BLOOD CULTURE ID PANEL (REFLEXED) - BCID2

## 2020-06-28 LAB — COMPREHENSIVE METABOLIC PANEL
ALT: 536 U/L — ABNORMAL HIGH (ref 0–44)
AST: 3414 U/L — ABNORMAL HIGH (ref 15–41)
Albumin: 2.8 g/dL — ABNORMAL LOW (ref 3.5–5.0)
Alkaline Phosphatase: 218 U/L — ABNORMAL HIGH (ref 38–126)
Anion gap: 10 (ref 5–15)
BUN: 20 mg/dL (ref 8–23)
CO2: 23 mmol/L (ref 22–32)
Calcium: 7.4 mg/dL — ABNORMAL LOW (ref 8.9–10.3)
Chloride: 104 mmol/L (ref 98–111)
Creatinine, Ser: 2.44 mg/dL — ABNORMAL HIGH (ref 0.44–1.00)
GFR, Estimated: 21 mL/min — ABNORMAL LOW (ref >60)
Glucose, Bld: 91 mg/dL (ref 70–99)
Potassium: 3.6 mmol/L (ref 3.5–5.1)
Sodium: 137 mmol/L (ref 135–145)
Total Bilirubin: 2 mg/dL — ABNORMAL HIGH (ref 0.3–1.2)
Total Protein: 5.9 g/dL — ABNORMAL LOW (ref 6.5–8.1)

## 2020-06-28 LAB — TECHNOLOGIST SMEAR REVIEW: Plt Morphology: NORMAL

## 2020-06-28 LAB — ACETAMINOPHEN LEVEL: Acetaminophen (Tylenol), Serum: <10 ug/mL — ABNORMAL LOW (ref 10–30)

## 2020-06-28 LAB — LACTIC ACID, PLASMA: Lactic Acid, Venous: 3.9 mmol/L (ref 0.5–1.9)

## 2020-06-28 LAB — MAGNESIUM: Magnesium: 1.2 mg/dL — ABNORMAL LOW (ref 1.7–2.4)

## 2020-06-28 LAB — PROCALCITONIN: Procalcitonin: 55.6 ng/mL

## 2020-06-28 LAB — GLUCOSE, CAPILLARY
Glucose-Capillary: 103 mg/dL — ABNORMAL HIGH (ref 70–99)
Glucose-Capillary: 111 mg/dL — ABNORMAL HIGH (ref 70–99)
Glucose-Capillary: 146 mg/dL — ABNORMAL HIGH (ref 70–99)
Glucose-Capillary: 64 mg/dL — ABNORMAL LOW (ref 70–99)
Glucose-Capillary: 73 mg/dL (ref 70–99)
Glucose-Capillary: 77 mg/dL (ref 70–99)
Glucose-Capillary: 90 mg/dL (ref 70–99)

## 2020-06-28 LAB — MRSA PCR SCREENING: MRSA by PCR: NEGATIVE

## 2020-06-28 LAB — PATHOLOGIST SMEAR REVIEW

## 2020-06-28 LAB — HIV ANTIBODY (ROUTINE TESTING W REFLEX): HIV Screen 4th Generation wRfx: NONREACTIVE

## 2020-06-28 LAB — PHOSPHORUS: Phosphorus: 3.3 mg/dL (ref 2.5–4.6)

## 2020-06-28 MED ORDER — MAGNESIUM SULFATE 2 GM/50ML IV SOLN
2.0000 g | Freq: Once | INTRAVENOUS | Status: AC
  Administered 2020-06-28: 2 g via INTRAVENOUS
  Filled 2020-06-28: qty 50

## 2020-06-28 MED ORDER — GADOBUTROL 1 MMOL/ML IV SOLN
8.0000 mL | Freq: Once | INTRAVENOUS | Status: AC | PRN
  Administered 2020-06-28: 8 mL via INTRAVENOUS

## 2020-06-28 MED ORDER — MIDAZOLAM HCL 5 MG/5ML IJ SOLN
INTRAMUSCULAR | Status: AC | PRN
  Administered 2020-06-28: 0.5 mg via INTRAVENOUS

## 2020-06-28 MED ORDER — CHLORHEXIDINE GLUCONATE CLOTH 2 % EX PADS
6.0000 | MEDICATED_PAD | Freq: Every day | CUTANEOUS | Status: DC
  Administered 2020-06-28 – 2020-07-02 (×3): 6 via TOPICAL

## 2020-06-28 MED ORDER — SODIUM BICARBONATE 8.4 % IV SOLN
50.0000 meq | Freq: Once | INTRAVENOUS | Status: AC
  Administered 2020-06-28: 50 meq via INTRAVENOUS
  Filled 2020-06-28: qty 50

## 2020-06-28 MED ORDER — HYDROCORTISONE NA SUCCINATE PF 100 MG IJ SOLR
50.0000 mg | Freq: Four times a day (QID) | INTRAMUSCULAR | Status: DC
  Administered 2020-06-28 – 2020-07-04 (×24): 50 mg via INTRAVENOUS
  Filled 2020-06-28 (×6): qty 2
  Filled 2020-06-28: qty 1
  Filled 2020-06-28 (×2): qty 2
  Filled 2020-06-28: qty 1
  Filled 2020-06-28 (×10): qty 2
  Filled 2020-06-28: qty 1
  Filled 2020-06-28 (×4): qty 2

## 2020-06-28 MED ORDER — DEXTROSE 5 % IV SOLN
INTRAVENOUS | Status: DC
  Administered 2020-06-28: 1000 mL via INTRAVENOUS

## 2020-06-28 MED ORDER — VASOPRESSIN 20 UNITS/100 ML INFUSION FOR SHOCK
0.0000 [IU]/min | INTRAVENOUS | Status: DC
  Administered 2020-06-28 (×3): 0.03 [IU]/min via INTRAVENOUS
  Administered 2020-06-29: 0.04 [IU]/min via INTRAVENOUS
  Administered 2020-06-30 (×2): 0.03 [IU]/min via INTRAVENOUS
  Administered 2020-07-01: 0.04 [IU]/min via INTRAVENOUS
  Filled 2020-06-28 (×8): qty 100

## 2020-06-28 MED ORDER — CALCIUM GLUCONATE-NACL 1-0.675 GM/50ML-% IV SOLN
1.0000 g | Freq: Once | INTRAVENOUS | Status: AC
  Administered 2020-06-28: 1000 mg via INTRAVENOUS
  Filled 2020-06-28: qty 50

## 2020-06-28 MED ORDER — MIDAZOLAM HCL 5 MG/5ML IJ SOLN
INTRAMUSCULAR | Status: AC
  Filled 2020-06-28: qty 5

## 2020-06-28 MED ORDER — LACTATED RINGERS IV BOLUS
1000.0000 mL | Freq: Once | INTRAVENOUS | Status: AC
  Administered 2020-06-28: 1000 mL via INTRAVENOUS

## 2020-06-28 MED ORDER — GADOBUTROL 1 MMOL/ML IV SOLN: 10.0000 mL | Freq: Once | INTRAVENOUS | Status: DC | PRN

## 2020-06-28 MED ORDER — DEXTROSE 50 % IV SOLN
12.5000 g | Freq: Once | INTRAVENOUS | Status: AC
  Administered 2020-06-28: 12.5 g via INTRAVENOUS
  Filled 2020-06-28: qty 50

## 2020-06-28 MED ORDER — FENTANYL CITRATE (PF) 100 MCG/2ML IJ SOLN
INTRAMUSCULAR | Status: AC | PRN
Start: 2020-06-28 — End: 2020-06-28
  Administered 2020-06-28: 25 ug via INTRAVENOUS

## 2020-06-28 MED ORDER — MORPHINE SULFATE (PF) 2 MG/ML IV SOLN
1.0000 mg | INTRAVENOUS | Status: DC | PRN
  Administered 2020-06-28 (×2): 2 mg via INTRAVENOUS
  Administered 2020-06-30 – 2020-07-02 (×4): 1 mg via INTRAVENOUS
  Filled 2020-06-28 (×6): qty 1

## 2020-06-28 MED ORDER — DEXTROSE 50 % IV SOLN
12.5000 g | INTRAVENOUS | Status: AC
  Administered 2020-06-28: 12.5 g via INTRAVENOUS
  Filled 2020-06-28: qty 50

## 2020-06-28 MED ORDER — LACTATED RINGERS IV BOLUS
2000.0000 mL | Freq: Once | INTRAVENOUS | Status: AC
  Administered 2020-06-28: 2000 mL via INTRAVENOUS

## 2020-06-28 MED ORDER — FENTANYL CITRATE (PF) 100 MCG/2ML IJ SOLN
INTRAMUSCULAR | Status: AC
  Filled 2020-06-28: qty 2

## 2020-06-28 MED ORDER — NOREPINEPHRINE 16 MG/250ML-% IV SOLN
0.0000 ug/min | INTRAVENOUS | Status: DC
  Administered 2020-06-28 (×3): 24 ug/min via INTRAVENOUS
  Filled 2020-06-28 (×7): qty 250

## 2020-06-28 NOTE — Progress Notes (Signed)
[place] visited pt. while rounding on ICU; pt. sitting up in bed appearing initially to be asleep but greeting [place] after he endtered the room to talk w/husband at bedside.  Pt. says she had a procedure yesterday that has prevented medical team from allowing her to have anything to eat and drink --> pt. is very thirsty and complaining of sore throat; [place] inquired w/RN if ice chips would be possible; RN suggested MD be consulted; [place] left secure chat and will await answer.  [place] remains available.

## 2020-06-28 NOTE — Progress Notes (Signed)
PHARMACY - PHYSICIAN COMMUNICATION CRITICAL VALUE ALERT - BLOOD CULTURE IDENTIFICATION (BCID)  Carrie Hardy is an 59 y.o. female who presented to [facility] on 06/27/2020 with a chief complaint of fatigue, RUQ tenderness  Assessment:  In ED, patient hypotensive, tachycardic, and elevated lactic acid.  She is on norepinephrine gtt.  Blood culture with GNR, BCID = E. Coli.  Source is likely cholecystitis.   Name of physician (or Provider) Contacted: Dr Mortimer Fries  Current antibiotics: piperacillin/tazobactam  Changes to prescribed antibiotics recommended:  Patient is on recommended antibiotics - No changes needed  Results for orders placed or performed during the hospital encounter of 06/27/20  Blood Culture ID Panel (Reflexed) (Collected: 06/27/2020  7:51 PM)  Result Value Ref Range   Enterococcus faecalis NOT DETECTED NOT DETECTED   Enterococcus Faecium NOT DETECTED NOT DETECTED   Listeria monocytogenes NOT DETECTED NOT DETECTED   Staphylococcus species NOT DETECTED NOT DETECTED   Staphylococcus aureus (BCID) NOT DETECTED NOT DETECTED   Staphylococcus epidermidis NOT DETECTED NOT DETECTED   Staphylococcus lugdunensis NOT DETECTED NOT DETECTED   Streptococcus species NOT DETECTED NOT DETECTED   Streptococcus agalactiae NOT DETECTED NOT DETECTED   Streptococcus pneumoniae NOT DETECTED NOT DETECTED   Streptococcus pyogenes NOT DETECTED NOT DETECTED   A.calcoaceticus-baumannii NOT DETECTED NOT DETECTED   Bacteroides fragilis NOT DETECTED NOT DETECTED   Enterobacterales DETECTED (A) NOT DETECTED   Enterobacter cloacae complex NOT DETECTED NOT DETECTED   Escherichia coli DETECTED (A) NOT DETECTED   Klebsiella aerogenes NOT DETECTED NOT DETECTED   Klebsiella oxytoca NOT DETECTED NOT DETECTED   Klebsiella pneumoniae NOT DETECTED NOT DETECTED   Proteus species NOT DETECTED NOT DETECTED   Salmonella species NOT DETECTED NOT DETECTED   Serratia marcescens NOT DETECTED NOT DETECTED    Haemophilus influenzae NOT DETECTED NOT DETECTED   Neisseria meningitidis NOT DETECTED NOT DETECTED   Pseudomonas aeruginosa NOT DETECTED NOT DETECTED   Stenotrophomonas maltophilia NOT DETECTED NOT DETECTED   Candida albicans NOT DETECTED NOT DETECTED   Candida auris NOT DETECTED NOT DETECTED   Candida glabrata NOT DETECTED NOT DETECTED   Candida krusei NOT DETECTED NOT DETECTED   Candida parapsilosis NOT DETECTED NOT DETECTED   Candida tropicalis NOT DETECTED NOT DETECTED   Cryptococcus neoformans/gattii NOT DETECTED NOT DETECTED   CTX-M ESBL NOT DETECTED NOT DETECTED   Carbapenem resistance IMP NOT DETECTED NOT DETECTED   Carbapenem resistance KPC NOT DETECTED NOT DETECTED   Carbapenem resistance NDM NOT DETECTED NOT DETECTED   Carbapenem resist OXA 48 LIKE NOT DETECTED NOT DETECTED   Carbapenem resistance VIM NOT DETECTED NOT DETECTED   Doreene Eland, PharmD, BCPS.   Work Cell: [phone] 06/28/2020 9:11 AM

## 2020-06-28 NOTE — Procedures (Signed)
Pre procedural Dx: Acute Cholelithiasis, poor operative candidate. Post procedural Dx: Same  Technically successful [place] and CT guided placed of a 10 Fr drainage catheter placement into the gallbladder for acute cholecystitis. Chole tube connected to gravity bag.   Sample of aspirated bile sent to lab for analysis.   EBL: Minimal  Complications: None immediate  Ronny Bacon, MD Pager #: [phone]

## 2020-06-28 NOTE — Procedures (Signed)
Central Venous Catheter Insertion Procedure Note  Carrie Hardy  [id number]  05-23-1961  Date:06/28/20  Time:3:41 AM   Provider Performing:Johnpaul Gillentine L Rust-Chester   Procedure: Insertion of Non-tunneled Central Venous [phone]) with US guidance ([id number])   Indication(s) Medication administration  Consent Risks of the procedure as well as the alternatives and risks of each were explained to the patient and/or caregiver.  Consent for the procedure was obtained and is signed in the bedside chart  Anesthesia Topical only with 1% lidocaine   Timeout Verified patient identification, verified procedure, site/side was marked, verified correct patient position, special equipment/implants available, medications/allergies/relevant history reviewed, required imaging and test results available.  Sterile Technique Maximal sterile technique including full sterile barrier drape, hand hygiene, sterile gown, sterile gloves, mask, hair covering, sterile ultrasound probe cover (if used).  Procedure Description Area of catheter insertion was cleaned with chlorhexidine and draped in sterile fashion.  With real-time ultrasound guidance a central venous catheter was placed into the right femoral vein. Nonpulsatile blood flow and easy flushing noted in all ports.  The catheter was sutured in place and sterile dressing applied.  Complications/Tolerance None; patient tolerated the procedure well. Chest X-ray is ordered to verify placement for internal jugular or subclavian cannulation.   Chest x-ray is not ordered for femoral cannulation.  EBL Minimal  Specimen(s) None  [name] Rust-Chester, AGACNP-BC [facility]    Please see Amion for pager details.

## 2020-06-28 NOTE — ED Notes (Signed)
Admitting @ bedside

## 2020-06-28 NOTE — Progress Notes (Signed)
PHARMACY CONSULT NOTE - FOLLOW UP  Pharmacy Consult for Electrolyte Monitoring and Replacement   Recent Labs: Potassium (mmol/L)  Date Value  06/27/2020 4.6  09/09/2014 3.6   Calcium (mg/dL)  Date Value  06/27/2020 8.2 (L)   Calcium, Total (mg/dL)  Date Value  09/09/2014 9.6   Albumin (g/dL)  Date Value  06/27/2020 3.3 (L)  09/09/2014 3.8   Sodium (mmol/L)  Date Value  06/27/2020 138  09/09/2014 135 (L)     Assessment: 10/19:  Ca = 8.2, Alb = 3.3, Corrected Ca = 8.76  Goal of Therapy:  Electrolytes WNL   Plan:  Calcium gluconate 1 gm IV X 1 ordered. Will recheck electrolytes on 10/20 with AM labs.   Orene Desanctis ,PharmD Clinical Pharmacist 06/28/2020 12:19 AM

## 2020-06-28 NOTE — Consult Note (Signed)
Chief Complaint: Acute cholecysitis  Referring Physician(s): Pabon (Surgery)  Patient Status: [facility] - In-pt  History of Present Illness: Carrie Hardy is a 59 y.o. female with past medical history significant for hypertension who is admitted to the emergency department and is currently in the ICU with sepsis secondary to acute cholecystitis.  Patient has been evaluated by the surgical service and deemed a poor operative candidate given current instability.  As such, request made for image guided placement of a cholecystostomy tube for infection source control purposes.     Past Medical History:  Diagnosis Date  . Hypertension     History reviewed. No pertinent surgical history.  Allergies: Tramadol  Medications: Prior to Admission medications   Medication Sig Start Date End Date Taking? Authorizing Provider  amLODipine (NORVASC) 10 MG tablet Take 10 mg by mouth daily. 05/09/20  Yes [provider]  atorvastatin (LIPITOR) 40 MG tablet Take 40 mg by mouth at bedtime. 05/09/20  Yes [provider]  metoprolol succinate (TOPROL-XL) 50 MG 24 hr tablet Take 50 mg by mouth daily. 05/09/20  Yes [provider]     No family history on file.  Social History   Socioeconomic History  . Marital status: Married    Spouse name: Not on file  . Number of children: Not on file  . Years of education: Not on file  . Highest education level: Not on file  Occupational History  . Not on file  Tobacco Use  . Smoking status: Never Smoker  . Smokeless tobacco: Never Used  Substance and Sexual Activity  . Alcohol use: Not Currently  . Drug use: Not Currently  . Sexual activity: Not on file  Other Topics Concern  . Not on file  Social History Narrative  . Not on file   Social Determinants of Health   Financial Resource Strain:   . Difficulty of Paying Living Expenses: Not on file  Food Insecurity:   . Worried About Charity fundraiser in the Last  Year: Not on file  . Ran Out of Food in the Last Year: Not on file  Transportation Needs:   . Lack of Transportation (Medical): Not on file  . Lack of Transportation (Non-Medical): Not on file  Physical Activity:   . Days of Exercise per Week: Not on file  . Minutes of Exercise per Session: Not on file  Stress:   . Feeling of Stress : Not on file  Social Connections:   . Frequency of Communication with Friends and Family: Not on file  . Frequency of Social Gatherings with Friends and Family: Not on file  . Attends Religious Services: Not on file  . Active Member of Clubs or Organizations: Not on file  . Attends Archivist Meetings: Not on file  . Marital Status: Not on file    ECOG Status: 2 - Symptomatic, <50% confined to bed  Review of Systems: A 12 point ROS discussed and pertinent positives are indicated in the HPI above.  All other systems are negative.  Review of Systems  Vital Signs: BP 102/81 (BP Location: Left Arm)   Pulse (!) 104   Temp 99.1 F (37.3 C) (Oral)   Resp 18   Ht 5\' 5"  (1.651 m)   Wt 80.7 kg   SpO2 94%   BMI 29.61 kg/m   Physical Exam  Imaging: CT HEAD WO CONTRAST  Result Date: 06/27/2020 CLINICAL DATA:  Lightheaded. EXAM: CT HEAD WITHOUT CONTRAST TECHNIQUE: Contiguous  axial images were obtained from the base of the skull through the vertex without intravenous contrast. COMPARISON:  None. FINDINGS: Brain: There is mild cerebral atrophy with widening of the extra-axial spaces and ventricular dilatation. There are areas of decreased attenuation within the white matter tracts of the supratentorial brain, consistent with microvascular disease changes. Vascular: No hyperdense vessel or unexpected calcification. Skull: Normal. Negative for fracture or focal lesion. Sinuses/Orbits: No acute finding. Other: None. IMPRESSION: No acute intracranial pathology. Electronically Signed   By: Virgina Norfolk M.D.   On: 06/27/2020 18:10   [place]  Intraoperative  Result Date: 06/28/2020 CLINICAL DATA:  Ultrasound was provided for use by the ordering physician.  No provider Interpretation or professional fees incurred.    CT ABDOMEN PELVIS W CONTRAST  Result Date: 06/27/2020 CLINICAL DATA:  Elevated LFTs EXAM: CT ABDOMEN AND PELVIS WITH CONTRAST TECHNIQUE: Multidetector CT imaging of the abdomen and pelvis was performed using the standard protocol following bolus administration of intravenous contrast. CONTRAST:  94mL OMNIPAQUE IOHEXOL 300 MG/ML  SOLN COMPARISON:  Ultrasound from earlier in the same day, CT from 03/27/2016. FINDINGS: Lower chest: No acute abnormality. Hepatobiliary: Fatty infiltration of the liver is noted. The gallbladder is well distended with multiple dependent gallstones. Mild pericholecystic inflammatory changes are noted similar to that seen on prior ultrasound. No biliary ductal dilatation is seen. Pancreas: Unremarkable. No pancreatic ductal dilatation or surrounding inflammatory changes. Spleen: Normal in size without focal abnormality. Adrenals/Urinary Tract: Adrenal glands are within normal limits. Kidneys demonstrate a normal enhancement pattern bilaterally. No renal calculi or obstructive changes are seen. Delayed images demonstrate no significant excretion although this is expected given the patient's poor renal function. Bladder is partially distended. Stomach/Bowel: No obstructive or inflammatory changes of the colon are seen. The appendix is within normal limits. Small bowel shows some postsurgical changes. No obstructive changes are noted. Postsurgical changes are noted in normal stomach as well consistent with prior bariatric surgery. Vascular/Lymphatic: Aortic atherosclerosis. No enlarged abdominal or pelvic lymph nodes. Reproductive: Uterus is well visualized and demonstrates a central hypodense mass lesion. This likely represents a degenerating fibroid given the more diffuse enhancement on prior CT examination.  Other: No abdominal wall hernia or abnormality. No abdominopelvic ascites. Musculoskeletal: Degenerative changes of lumbar spine are noted. IMPRESSION: Dilated gallbladder with wall thickening and pericholecystic inflammatory change in cholelithiasis similar to that seen on prior ultrasound examination. Again HIDA scan may be helpful to assess for gallbladder function/cystic duct patency. Hypodense lesion within the uterus which measures approximately 3.3 cm. This likely represents a degenerating fibroid given the findings on prior CT. Chronic changes as described above. Electronically Signed   By: Inez Catalina M.D.   On: 06/27/2020 23:22   DG Chest Port 1 View  Result Date: 06/28/2020 CLINICAL DATA:  Respiratory failure. EXAM: PORTABLE CHEST 1 VIEW COMPARISON:  Chest x-ray 06/27/2020. FINDINGS: Heart size stable. Low lung volumes. Very mild bilateral interstitial prominence noted. Mild pneumonitis can not be excluded. Persistent mild bibasilar subsegmental atelectasis. No pleural effusion or pneumothorax. IMPRESSION: Low lung volumes. Very mild bilateral interstitial prominence noted. Mild pneumonitis can not be excluded. Persistent mild bibasilar subsegmental atelectasis. Electronically Signed   By: Marcello Moores  Register   On: 06/28/2020 07:56   DG Chest Port 1 View  Result Date: 06/27/2020 CLINICAL DATA:  Possible sepsis EXAM: PORTABLE CHEST 1 VIEW COMPARISON:  None. FINDINGS: Minimal atelectasis left base. No consolidation or effusion. Normal heart size. Aortic atherosclerosis. No pneumothorax. IMPRESSION: No active disease. Electronically Signed  By: Donavan Foil M.D.   On: 06/27/2020 19:00   US ABDOMEN LIMITED RUQ (LIVER/GB)  Result Date: 06/27/2020 CLINICAL DATA:  Elevated LFT EXAM: ULTRASOUND ABDOMEN LIMITED RIGHT UPPER QUADRANT COMPARISON:  CT 03/27/2016 FINDINGS: Gallbladder: Multiple shadowing stones in the gallbladder. Increased gallbladder wall thickness measuring 7.3 mm. Negative  sonographic Murphy. Edematous appearing gallbladder wall with small amount of pericholecystic fluid. Common bile duct: Diameter: 3.6 mm Liver: Liver is echogenic. Previously described liver mass is not seen sonographically. Portal vein is patent on color Doppler imaging with normal direction of blood flow towards the liver. Other: Small amount of free fluid in the right upper quadrant IMPRESSION: 1. Cholelithiasis with gallbladder wall thickening/edema and small amount of pericholecystic fluid but negative sonographic Percell Miller, concern is raised for cholecystitis despite negative sonographic Murphy. Correlation with nuclear medicine hepatobiliary imaging could be obtained for further evaluation 2. Diffusely echogenic liver consistent with steatosis and or hepatocellular disease 3. Small amount of right upper quadrant ascites Electronically Signed   By: Donavan Foil M.D.   On: 06/27/2020 22:51    Labs:  CBC: Recent Labs    06/27/20 1735 06/28/20 0336  WBC 1.1* 6.0  HGB 10.9* 9.3*  HCT 31.7* 26.7*  PLT 191 142*    COAGS: Recent Labs    06/27/20 1811  INR 1.1  APTT 23*    BMP: Recent Labs    06/27/20 1735 06/28/20 0336  NA 138 137  K 4.6 3.6  CL 103 104  CO2 19* 23  GLUCOSE 82 91  BUN 16 20  CALCIUM 8.2* 7.4*  CREATININE 1.88* 2.44*  GFRNONAA 28* 21*    LIVER FUNCTION TESTS: Recent Labs    06/27/20 1735 06/28/20 0336  BILITOT 1.9* 2.0*  AST 6,003* 3,414*  ALT 731* 536*  ALKPHOS 329* 218*  PROT 6.5 5.9*  ALBUMIN 3.3* 2.8*    TUMOR MARKERS: No results for input(s): AFPTM, CEA, CA199, CHROMGRNA in the last 8760 hours.  Assessment and Plan:  Carrie Hardy is a 59 y.o. female with past medical history significant for hypertension who is admitted to the emergency department and is currently in the ICU with sepsis secondary to acute cholecystitis.  Patient has been evaluated by the surgical service and deemed a poor operative candidate given current instability.   As such, request made for image guided placement of a cholecystostomy tube for infection source control purposes.  Risks and benefits of image guided cholecystostomy tube placement was discussed with the patient including, but not limited to bleeding, infection, gallbladder perforation, bile leak, sepsis or even death.  All of the patient's questions were answered, patient is agreeable to proceed. Consent signed and in chart.    Thank you for this interesting consult.  I greatly enjoyed meeting KAILEEN BRONKEMA and look forward to participating in their care.  A copy of this report was sent to the requesting provider on this date.  Electronically Signed: Sandi Mariscal, MD 06/28/2020, 10:53 AM   I spent a total of 20 Minutes in face to face in clinical consultation, greater than 50% of which was counseling/coordinating care for image guided cholecystostomy tube placement.

## 2020-06-28 NOTE — ED Notes (Signed)
Pt BP decreased to 74/54 on 80mcg levo. DMelene Muller made aware, new orders placed.

## 2020-06-28 NOTE — Progress Notes (Signed)
PHARMACY CONSULT NOTE - FOLLOW UP  Pharmacy Consult for Electrolyte Monitoring and Replacement   Recent Labs: Potassium (mmol/L)  Date Value  06/28/2020 3.6  09/09/2014 3.6   Magnesium (mg/dL)  Date Value  06/28/2020 1.2 (L)   Calcium (mg/dL)  Date Value  06/28/2020 7.4 (L)   Calcium, Total (mg/dL)  Date Value  09/09/2014 9.6   Albumin (g/dL)  Date Value  06/28/2020 2.8 (L)  09/09/2014 3.8   Phosphorus (mg/dL)  Date Value  06/28/2020 3.3   Sodium (mmol/L)  Date Value  06/28/2020 137  09/09/2014 135 (L)     Assessment: 59 year old female with shock from acute cholecystitis. Elevation of LFTs. Patient s/p chole tube with culture sent. Patient requiring vasopressors and started on stress dose steroids. Blood culture with E.coli. Currently on Zosyn, pending de-escalation based on culture from drainage from chole tube and clinical improvement.  MIVF: LR at 75 ml/hr  Goal of Therapy:  Electrolytes WNL   Plan:  Magnesium 2 g IV given. Will follow all electrolytes with morning labs.   Tawnya Crook ,PharmD Clinical Pharmacist 06/28/2020 11:14 AM

## 2020-06-28 NOTE — ED Notes (Signed)
D. Blakeney notified of pt's continued decrease in BP despite increase in levo.

## 2020-06-28 NOTE — Consult Note (Signed)
Patient ID: Carrie Hardy, female   DOB: 1961/09/08, 59 y.o.   MRN: [id number]  HPI Carrie Hardy is a 59 y.o. female seen in consultation at the request of Dr. Joni Fears.  Case discussed with him in detail.  Came in with failure to thrive and some near syncope.  She did not have any complaints of abdominal pain fevers or chills.  The patient is not a great historian.  On arrival to the emergency room she was found hypotensive and responded partially to aggressive fluid resuscitation.  She also had some slurred speech resolved.  Of the work-up including LFTs as well as a CMP evidence of acute kidney injury with a creatinine of 1.8 and significant elevation of the AST as well as a total bilirubin.  Of note looking at her records it seemed that she had a history of cholecystitis at some point in time and also had a history of cirrhosis.  Surgical history significant for laparoscopic gastric bypass several years ago. Also had an ultrasound and CT scan that have personally reviewed showing evidence of distended gallbladder and thickening of the wall consistent with cholecystitis.  There is no evidence of common bile duct dilation.  She continues to remain hypotensive and currently is on pressors.  Her mentation is much improved.  HPI  Past Medical History:  Diagnosis Date  . Hypertension     History reviewed. No pertinent surgical history.  No pertinent fam hx  Social History Social History   Tobacco Use  . Smoking status: Never Smoker  . Smokeless tobacco: Never Used  Substance Use Topics  . Alcohol use: Not Currently  . Drug use: Not Currently    Allergies  Allergen Reactions  . Tramadol Itching    Current Facility-Administered Medications  Medication Dose Route Frequency Provider Last Rate Last Admin  . 0.9 %  sodium chloride infusion  250 mL Intravenous Continuous Carrie Mew, MD 10 mL/hr at 06/27/20 2111 250 mL at 06/27/20 2111  . albumin human 25 % solution 25 g  25 g  Intravenous Once Hussam Muniz, [place] F, MD      . docusate sodium (COLACE) capsule 100 mg  100 mg Oral BID PRN Rust-Chester, Toribio Harbour L, NP      . lactated ringers infusion   Intravenous Continuous Rust-Chester, Huel Cote, NP 75 mL/hr at 06/28/20 0140 New Bag at 06/28/20 0140  . norepinephrine (LEVOPHED) 4mg  in 260mL premix infusion  2-15 mcg/min Intravenous Titrated Awilda Bill, NP 67.5 mL/hr at 06/28/20 0144 18 mcg/min at 06/28/20 0144  . ondansetron (ZOFRAN) injection 4 mg  4 mg Intravenous Q6H PRN Rust-Chester, Britton L, NP      . pantoprazole (PROTONIX) injection 40 mg  40 mg Intravenous QHS Rust-Chester, Britton L, NP   40 mg at 06/28/20 0136  . piperacillin-tazobactam (ZOSYN) IVPB 3.375 g  3.375 g Intravenous Vida Roller, MD 12.5 mL/hr at 06/28/20 0009 3.375 g at 06/28/20 0009  . polyethylene glycol (MIRALAX / GLYCOLAX) packet 17 g  17 g Oral Daily PRN Rust-Chester, Britton L, NP      . vasopressin (PITRESSIN) 20 Units in sodium chloride 0.9 % 100 mL infusion-*FOR SHOCK*  0-0.04 Units/min Intravenous Continuous Awilda Bill, NP       Current Outpatient Medications  Medication Sig Dispense Refill  . amLODipine (NORVASC) 10 MG tablet Take 10 mg by mouth daily.    Marland Kitchen atorvastatin (LIPITOR) 40 MG tablet Take 40 mg by mouth at bedtime.    Marland Kitchen  metoprolol succinate (TOPROL-XL) 50 MG 24 hr tablet Take 50 mg by mouth daily.       Review of Systems Full ROS  was asked and was negative except for the information on the HPI  Physical Exam Blood pressure (!) 78/56, pulse (!) 117, temperature 98.4 F (36.9 C), temperature source Oral, resp. rate 20, height 5\' 5"  (1.651 m), weight 81.6 kg, SpO2 98 %. CONSTITUTIONAL: She is laying comfortable in bed EYES: Pupils are equal, round, Sclera are non-icteric. EARS, NOSE, MOUTH AND THROAT: She is wearing a mask hearing is intact to voice. LYMPH NODES:  Lymph nodes in the neck are normal. RESPIRATORY:  Lungs are clear. There is normal respiratory  effort, with equal breath sounds bilaterally, and without pathologic use of accessory muscles. CARDIOVASCULAR: Heart is regular without murmurs, gallops, or rubs. GI: The abdomen is soft, there is palpation to the right upper quadrant with positive Murphy sign without peritonitis there is no hepatosplenomegaly. There are normal bowel sounds in all quadrants. GU: Rectal deferred.   MUSCULOSKELETAL: Normal muscle strength and tone. No cyanosis or edema.   SKIN: Turgor is good and there are no pathologic skin lesions or ulcers. NEUROLOGIC: Motor and sensation is grossly normal. Cranial nerves are grossly intact. PSYCH:  Oriented to person, place and time. Affect is normal.  Data Reviewed  I have personally reviewed the patient's imaging, laboratory findings and medical records.    Assessment/Plan 59 year old female with sepsis and septic shock because likely from severe acute cholecystitis.  There is significant elevation of the LFTs and given this concern I do think that we need to make sure that she does not have a common bile duct stone.  First order of business is to continue aggressive fluid resuscitation and antibiotic management.  Given her hemodynamic state I do not think that he will be prudent to perform a laparoscopic procedure.  He will be in her best interest to perform a cholecystostomy tube and get her optimized from medical perspective.  Obviously if she does not respond to cholecystostomy tube may entertain cholecystectomy.  Ideally cholecystectomy will be done in a couple months once her medical status improves and inflammatory reaction subsides.  Extensive discussion with the patient and with the critical care team.  No need for emergent surgical intervention at this time We will continue to follow her    Caroleen Hamman, MD Cutter Surgeon 06/28/2020, 3:12 AM

## 2020-06-28 NOTE — Sedation Documentation (Signed)
Total conscious sedation: Versed 0.5 mg IV. Fentanyl 25 mcg IV. Pt. Tolerated procedure well. VSS. 65 ml dark brown drainage removed by MD  Drainage bag attached. Report given to ICU RN. ICU RN's x2 to transport pt. Stable for transfer.

## 2020-06-29 ENCOUNTER — Encounter
Admission: EM | Disposition: A | Discharge status: Home Health | Payer: Self-pay | Source: Home / Self Care | Attending: Internal Medicine

## 2020-06-29 DIAGNOSIS — R6521 Severe sepsis with septic shock: Secondary | ICD-10-CM | POA: Diagnosis not present

## 2020-06-29 DIAGNOSIS — A419 Sepsis, unspecified organism: Secondary | ICD-10-CM

## 2020-06-29 LAB — GLUCOSE, CAPILLARY
Glucose-Capillary: 119 mg/dL — ABNORMAL HIGH (ref 70–99)
Glucose-Capillary: 119 mg/dL — ABNORMAL HIGH (ref 70–99)
Glucose-Capillary: 126 mg/dL — ABNORMAL HIGH (ref 70–99)
Glucose-Capillary: 89 mg/dL (ref 70–99)
Glucose-Capillary: 96 mg/dL (ref 70–99)
Glucose-Capillary: 99 mg/dL (ref 70–99)

## 2020-06-29 LAB — CBC WITH DIFFERENTIAL/PLATELET
Abs Immature Granulocytes: 0.07 10*3/uL (ref 0.00–0.07)
Basophils Absolute: 0.1 10*3/uL (ref 0.0–0.1)
Basophils Relative: 1 %
Eosinophils Absolute: 0 10*3/uL (ref 0.0–0.5)
Eosinophils Relative: 0 %
HCT: 29.5 % — ABNORMAL LOW (ref 36.0–46.0)
Hemoglobin: 10.1 g/dL — ABNORMAL LOW (ref 12.0–15.0)
Immature Granulocytes: 1 %
Lymphocytes Relative: 3 %
Lymphs Abs: 0.4 10*3/uL — ABNORMAL LOW (ref 0.7–4.0)
MCH: 36.6 pg — ABNORMAL HIGH (ref 26.0–34.0)
MCHC: 34.2 g/dL (ref 30.0–36.0)
MCV: 106.9 fL — ABNORMAL HIGH (ref 80.0–100.0)
Monocytes Absolute: 0.2 10*3/uL (ref 0.1–1.0)
Monocytes Relative: 1 %
Neutro Abs: 13.9 10*3/uL — ABNORMAL HIGH (ref 1.7–7.7)
Neutrophils Relative %: 94 %
Platelets: 119 10*3/uL — ABNORMAL LOW (ref 150–400)
RBC: 2.76 MIL/uL — ABNORMAL LOW (ref 3.87–5.11)
RDW: 14.8 % (ref 11.5–15.5)
Smear Review: NORMAL
WBC: 14.7 10*3/uL — ABNORMAL HIGH (ref 4.0–10.5)
nRBC: 0.5 % — ABNORMAL HIGH (ref 0.0–0.2)

## 2020-06-29 LAB — BASIC METABOLIC PANEL
Anion gap: 18 — ABNORMAL HIGH (ref 5–15)
BUN: 29 mg/dL — ABNORMAL HIGH (ref 8–23)
CO2: 17 mmol/L — ABNORMAL LOW (ref 22–32)
Calcium: 6.9 mg/dL — ABNORMAL LOW (ref 8.9–10.3)
Chloride: 101 mmol/L (ref 98–111)
Creatinine, Ser: 2.65 mg/dL — ABNORMAL HIGH (ref 0.44–1.00)
GFR, Estimated: 19 mL/min — ABNORMAL LOW (ref >60)
Glucose, Bld: 117 mg/dL — ABNORMAL HIGH (ref 70–99)
Potassium: 4 mmol/L (ref 3.5–5.1)
Sodium: 136 mmol/L (ref 135–145)

## 2020-06-29 LAB — LACTIC ACID, PLASMA
Lactic Acid, Venous: 5.1 mmol/L (ref 0.5–1.9)
Lactic Acid, Venous: 5.3 mmol/L (ref 0.5–1.9)
Lactic Acid, Venous: 4 mmol/L (ref 0.5–1.9)

## 2020-06-29 LAB — HEPATIC FUNCTION PANEL
ALT: 315 U/L — ABNORMAL HIGH (ref 0–44)
AST: 902 U/L — ABNORMAL HIGH (ref 15–41)
Albumin: 2.8 g/dL — ABNORMAL LOW (ref 3.5–5.0)
Alkaline Phosphatase: 134 U/L — ABNORMAL HIGH (ref 38–126)
Bilirubin, Direct: 1.9 mg/dL — ABNORMAL HIGH (ref 0.0–0.2)
Indirect Bilirubin: 0.9 mg/dL (ref 0.3–0.9)
Total Bilirubin: 2.8 mg/dL — ABNORMAL HIGH (ref 0.3–1.2)
Total Protein: 5.8 g/dL — ABNORMAL LOW (ref 6.5–8.1)

## 2020-06-29 LAB — PROCALCITONIN: Procalcitonin: 77.77 ng/mL

## 2020-06-29 LAB — MAGNESIUM: Magnesium: 2.1 mg/dL (ref 1.7–2.4)

## 2020-06-29 LAB — PHOSPHORUS: Phosphorus: 6.6 mg/dL — ABNORMAL HIGH (ref 2.5–4.6)

## 2020-06-29 SURGERY — CHOLECYSTECTOMY, ROBOT-ASSISTED, LAPAROSCOPIC
Anesthesia: General

## 2020-06-29 MED ORDER — HEPARIN SODIUM (PORCINE) 5000 UNIT/ML IJ SOLN
5000.0000 [IU] | Freq: Three times a day (TID) | INTRAMUSCULAR | Status: DC
  Administered 2020-06-29 – 2020-07-01 (×6): 5000 [IU] via SUBCUTANEOUS
  Filled 2020-06-29 (×6): qty 1

## 2020-06-29 MED ORDER — SODIUM CHLORIDE 0.9 % IV BOLUS
1000.0000 mL | Freq: Once | INTRAVENOUS | Status: AC
  Administered 2020-06-29: 1000 mL via INTRAVENOUS

## 2020-06-29 MED ORDER — LOPERAMIDE HCL 2 MG PO CAPS
2.0000 mg | ORAL_CAPSULE | ORAL | Status: DC | PRN
  Administered 2020-06-29 – 2020-07-07 (×17): 2 mg via ORAL
  Filled 2020-06-29 (×17): qty 1

## 2020-06-29 MED ORDER — LACTATED RINGERS IV BOLUS
1000.0000 mL | Freq: Once | INTRAVENOUS | Status: AC
  Administered 2020-06-29: 1000 mL via INTRAVENOUS

## 2020-06-29 MED ORDER — ALBUMIN HUMAN 25 % IV SOLN
25.0000 g | Freq: Once | INTRAVENOUS | Status: AC
  Administered 2020-06-29: 25 g via INTRAVENOUS
  Filled 2020-06-29: qty 100

## 2020-06-29 MED ORDER — DEXTROSE-NACL 5-0.9 % IV SOLN: INTRAVENOUS | Status: DC

## 2020-06-29 NOTE — Progress Notes (Signed)
Dr. Mortimer Fries Notified patient has had no urine output. Per Dr Mortimer Fries will order another fluid bolus. No additional orders at this time.

## 2020-06-29 NOTE — Progress Notes (Signed)
PHARMACY CONSULT NOTE - FOLLOW UP  Pharmacy Consult for Electrolyte Monitoring and Replacement   Recent Labs: Potassium (mmol/L)  Date Value  06/29/2020 4.0  09/09/2014 3.6   Magnesium (mg/dL)  Date Value  06/29/2020 2.1   Calcium (mg/dL)  Date Value  06/29/2020 6.9 (L)   Calcium, Total (mg/dL)  Date Value  09/09/2014 9.6   Albumin (g/dL)  Date Value  06/28/2020 2.8 (L)  09/09/2014 3.8   Phosphorus (mg/dL)  Date Value  06/29/2020 6.6 (H)   Sodium (mmol/L)  Date Value  06/29/2020 136  09/09/2014 135 (L)     Assessment: 59 year old female with shock from acute cholecystitis. Elevation of LFTs. Patient s/p chole tube with culture sent. Patient requiring vasopressors and started on stress dose steroids. Blood culture with E.coli. Currently on Zosyn, pending de-escalation based on culture from drainage from chole tube and clinical improvement.  MIVF: D5 at 75 ml/hr  Goal of Therapy:  Electrolytes WNL   Plan:  Electrolytes stable. Creatinine trending up. Will follow all electrolytes with morning labs.   Tawnya Crook ,PharmD Clinical Pharmacist 06/29/2020 8:28 AM

## 2020-06-29 NOTE — Progress Notes (Signed)
Lactic critical at 5.1, increased from 3.9 on 10/20.  UOP marginal, BP still soft on vasopressors.  Additional LR bolus ordered, will continue IVF. Trend lactic at 8 am.  [name] [place], AGACNP-BC [facility]    Please see Amion for pager details.

## 2020-06-29 NOTE — Progress Notes (Signed)
CRITICAL CARE NOTE   10/19 admitted septic shock 10/20 GB drain placed on pressors, E COLI bacteramia   CC  follow up septic shock  SUBJECTIVE Patient remains critically ill Prognosis is guarded On pressors Alert and awake   BP 101/83   Pulse 94   Temp 97.8 F (36.6 C)   Resp (!) 27   Ht 5\' 5"  (1.651 m)   Wt 86.2 kg   SpO2 94%   BMI 31.62 kg/m    I/O last 3 completed shifts: In: 4594.5 [I.V.:3356; IV Piggyback:1238.5] Out: 80 [Urine:50; Drains:30] No intake/output data recorded.  SpO2: 94 % O2 Flow Rate (L/min): 2 L/min  Estimated body mass index is 31.62 kg/m as calculated from the following:   Height as of this encounter: 5\' 5"  (1.651 m).   Weight as of this encounter: 86.2 kg.   Review of Systems:  Gen:  Denies  fever, sweats, chills weight loss  HEENT: Denies blurred vision, double vision, ear pain, eye pain, hearing loss, nose bleeds, sore throat Cardiac:  No dizziness, chest pain or heaviness, chest tightness,edema, No JVD Resp:   No cough, -sputum production, -shortness of breath,-wheezing, -hemoptysis,  Gi: Denies swallowing difficulty, stomach pain, nausea or vomiting, diarrhea, constipation, bowel incontinence Other:  All other systems negative        PHYSICAL EXAMINATION:  GENERAL:critically ill appearing,  HEAD: Normocephalic, atraumatic.  EYES: Pupils equal, round, reactive to light.  No scleral icterus.  MOUTH: Moist mucosal membrane. NECK: Supple.  PULMONARY: +rhonchi,  CARDIOVASCULAR: S1 and S2. Regular rate and rhythm. No murmurs, rubs, or gallops.  GASTROINTESTINAL: Soft, nontender, -distended.  Positive bowel sounds.   MUSCULOSKELETAL: No swelling, clubbing, or edema.  NEUROLOGIC: alert and awake SKIN:intact,warm,dry  MEDICATIONS: I have reviewed all medications and confirmed regimen as documented   CULTURE RESULTS   Recent Results (from the past 240 hour(s))  Blood culture (routine single)     Status: Abnormal  (Preliminary result)   Collection Time: 06/27/20  7:51 PM   Specimen: BLOOD  Result Value Ref Range Status   Specimen Description   Final    BLOOD LEFT ANTECUBITAL Performed at [facility], [address], [place], [place] [address]    Special Requests   Final    BOTTLES DRAWN AEROBIC AND ANAEROBIC Blood Culture adequate volume Performed at [facility], [address] [place], [place] [address]    Culture  Setup Time   Final    Organism ID to follow [place] AEROBIC AND ANAEROBIC BOTTLES CRITICAL RESULT CALLED TO, READ BACK BY AND VERIFIED WITHLloyd Huger AT [id number] 06/28/20 SDR Performed at [facility], [address], [place], [place] [address]    Culture (A)  Final    ESCHERICHIA COLI SUSCEPTIBILITIES TO FOLLOW Performed at [facility], [place] [address] [place], [place] [address]    Report Status PENDING  Incomplete  Respiratory Panel by RT PCR (Flu A&B, Covid) - Nasopharyngeal Swab     Status: None   Collection Time: 06/27/20  7:51 PM   Specimen: Nasopharyngeal Swab  Result Value Ref Range Status   SARS Coronavirus 2 by RT PCR NEGATIVE NEGATIVE Final    Comment: (NOTE) SARS-CoV-2 target nucleic acids are NOT DETECTED.  The SARS-CoV-2 RNA is generally detectable in upper respiratoy specimens during the acute phase of infection. The lowest concentration of SARS-CoV-2 viral copies this assay can detect is 131 copies/mL. A negative result does not preclude SARS-Cov-2 infection and  should not be used as the sole basis for treatment or other patient management decisions. A negative result may occur with  improper specimen collection/handling, submission of specimen other than nasopharyngeal swab, presence of viral mutation(s) within the areas targeted by this assay, and inadequate number of viral copies (<131 copies/mL). A negative result must be combined with clinical observations, patient history, and  epidemiological information. The expected result is Negative.  Fact Sheet for Patients:  PinkCheek.be  Fact Sheet for Healthcare Providers:  GravelBags.it  This test is no t yet approved or cleared by the [place] FDA and  has been authorized for detection and/or diagnosis of SARS-CoV-2 by FDA under an Emergency Use Authorization (EUA). This EUA will remain  in effect (meaning this test can be used) for the duration of the COVID-19 declaration under Section 564(b)(1) of the Act, 21 U.S.C. section 360bbb-3(b)(1), unless the authorization is terminated or revoked sooner.     Influenza A by PCR NEGATIVE NEGATIVE Final   Influenza B by PCR NEGATIVE NEGATIVE Final    Comment: (NOTE) The Xpert Xpress SARS-CoV-2/FLU/RSV assay is intended as an aid in  the diagnosis of influenza from Nasopharyngeal swab specimens and  should not be used as a sole basis for treatment. Nasal washings and  aspirates are unacceptable for Xpert Xpress SARS-CoV-2/FLU/RSV  testing.  Fact Sheet for Patients: PinkCheek.be  Fact Sheet for Healthcare Providers: GravelBags.it  This test is not yet approved or cleared by the [place] FDA and  has been authorized for detection and/or diagnosis of SARS-CoV-2 by  FDA under an Emergency Use Authorization (EUA). This EUA will remain  in effect (meaning this test can be used) for the duration of the  Covid-19 declaration under Section 564(b)(1) of the Act, 21  U.S.C. section 360bbb-3(b)(1), unless the authorization is  terminated or revoked. Performed at [facility], [address] [place], [place] [address]   Blood Culture ID Panel (Reflexed)     Status: Abnormal   Collection Time: 06/27/20  7:51 PM  Result Value Ref Range Status   Enterococcus faecalis NOT DETECTED NOT DETECTED Final   Enterococcus Faecium NOT DETECTED NOT  DETECTED Final   Listeria monocytogenes NOT DETECTED NOT DETECTED Final   Staphylococcus species NOT DETECTED NOT DETECTED Final   Staphylococcus aureus (BCID) NOT DETECTED NOT DETECTED Final   Staphylococcus epidermidis NOT DETECTED NOT DETECTED Final   Staphylococcus lugdunensis NOT DETECTED NOT DETECTED Final   Streptococcus species NOT DETECTED NOT DETECTED Final   Streptococcus agalactiae NOT DETECTED NOT DETECTED Final   Streptococcus pneumoniae NOT DETECTED NOT DETECTED Final   Streptococcus pyogenes NOT DETECTED NOT DETECTED Final   A.calcoaceticus-baumannii NOT DETECTED NOT DETECTED Final   Bacteroides fragilis NOT DETECTED NOT DETECTED Final   Enterobacterales DETECTED (A) NOT DETECTED Final    Comment: Enterobacterales represent a large order of gram negative bacteria, not a single organism. CRITICAL RESULT CALLED TO, READ BACK BY AND VERIFIED WITH:  NATHAN BELUE AT 4765 06/28/20 SDR    Enterobacter cloacae complex NOT DETECTED NOT DETECTED Final   Escherichia coli DETECTED (A) NOT DETECTED Final    Comment: CRITICAL RESULT CALLED TO, READ BACK BY AND VERIFIED WITH:  NATHAN BELUE AT 4650 06/28/20 SDR    Klebsiella aerogenes NOT DETECTED NOT DETECTED Final   Klebsiella oxytoca NOT DETECTED NOT DETECTED Final   Klebsiella pneumoniae NOT DETECTED NOT DETECTED Final   Proteus species NOT DETECTED NOT DETECTED Final   Salmonella species NOT DETECTED NOT DETECTED  Final   Serratia marcescens NOT DETECTED NOT DETECTED Final   Haemophilus influenzae NOT DETECTED NOT DETECTED Final   Neisseria meningitidis NOT DETECTED NOT DETECTED Final   Pseudomonas aeruginosa NOT DETECTED NOT DETECTED Final   Stenotrophomonas maltophilia NOT DETECTED NOT DETECTED Final   Candida albicans NOT DETECTED NOT DETECTED Final   Candida auris NOT DETECTED NOT DETECTED Final   Candida glabrata NOT DETECTED NOT DETECTED Final   Candida krusei NOT DETECTED NOT DETECTED Final   Candida parapsilosis NOT  DETECTED NOT DETECTED Final   Candida tropicalis NOT DETECTED NOT DETECTED Final   Cryptococcus neoformans/gattii NOT DETECTED NOT DETECTED Final   CTX-M ESBL NOT DETECTED NOT DETECTED Final   Carbapenem resistance IMP NOT DETECTED NOT DETECTED Final   Carbapenem resistance KPC NOT DETECTED NOT DETECTED Final   Carbapenem resistance NDM NOT DETECTED NOT DETECTED Final   Carbapenem resist OXA 48 LIKE NOT DETECTED NOT DETECTED Final   Carbapenem resistance VIM NOT DETECTED NOT DETECTED Final    Comment: Performed at [facility], [address] [place], [place] [address]  MRSA PCR Screening     Status: None   Collection Time: 06/28/20  5:21 AM   Specimen: Nasopharyngeal  Result Value Ref Range Status   MRSA by PCR NEGATIVE NEGATIVE Final    Comment:        The GeneXpert MRSA Assay (FDA approved for NASAL specimens only), is one component of a comprehensive MRSA colonization surveillance program. It is not intended to diagnose MRSA infection nor to guide or monitor treatment for MRSA infections. Performed at [facility], [address] [place], [place] [address]   Aerobic/Anaerobic Culture (surgical/deep wound)     Status: None (Preliminary result)   Collection Time: 06/28/20 11:02 AM   Specimen: Fluid; Abscess  Result Value Ref Range Status   Specimen Description   Final    FLUID Performed at [facility], [address], [place], [place] [address]    Special Requests   Final    gallbladder fluid Performed at [facility], [place], [place] [address]    Gram Stain   Final    FEW WBC PRESENT,BOTH PMN AND MONONUCLEAR MODERATE GRAM POSITIVE RODS Performed at [facility], [place] [address], [place],  [address]    Culture PENDING  Incomplete   Report Status PENDING  Incomplete          IMAGING    [place] Intraoperative  Result Date: 06/28/2020 CLINICAL DATA:  Ultrasound was provided for use by the  ordering physician.  No provider Interpretation or professional fees incurred.    MR 3D Recon At Scanner  Result Date: 06/29/2020 CLINICAL DATA:  59 year old female with history of jaundice. Cholelithiasis. EXAM: MRI ABDOMEN WITHOUT AND WITH CONTRAST (INCLUDING MRCP) TECHNIQUE: Multiplanar multisequence MR imaging of the abdomen was performed both before and after the administration of intravenous contrast. Heavily T2-weighted images of the biliary and pancreatic ducts were obtained, and three-dimensional MRCP images were rendered by post processing. CONTRAST:  11mL GADAVIST GADOBUTROL 1 MMOL/ML IV SOLN COMPARISON:  No prior abdominal MRI. CT the abdomen and pelvis 06/27/2020. FINDINGS: Lower chest: Small right pleural effusion lying dependently with areas of probable passive atelectasis in the right lower lobe. Mild cardiomegaly. Hepatobiliary: In the periphery of segment 8 of the liver (axial image 25 of series 28) there is a 1.4 x 0.8 cm hypovascular region which is mildly T1 hypointense, mildly T2 hyperintense, and associated with  small filling defects in the proximal aspect of the middle hepatic vein (axial image 21 of series 28). No other hepatic lesions are noted. No intra or extrahepatic biliary ductal dilatation noted on MRCP images. However, there is mild diffuse periportal edema. Interval placement of percutaneous cholecystostomy tube which resides within a decompressed gallbladder. Gallbladder wall appears mildly thickened but severely edematous. Common bile duct measures 3 mm in the porta hepatis. No filling defect in the common bile duct to suggest choledocholithiasis. Pancreas: No pancreatic mass. No pancreatic ductal dilatation. No pancreatic or peripancreatic fluid collections. Trace amount of peripancreatic fluid. Spleen:  Unremarkable. Adrenals/Urinary Tract: Subcentimeter T1 hypointense, T2 hyperintense, nonenhancing lesions in both kidneys, compatible with simple cysts. Small amount of  perinephric fluid bilaterally. No hydroureteronephrosis in the visualized portions of the abdomen. Bilateral adrenal glands are normal in appearance. Stomach/Bowel: Unremarkable. Vascular/Lymphatic: Aortic atherosclerosis, without evidence of aneurysm in the abdominal vasculature. No lymphadenopathy noted in the abdomen. Other: Small volume of ascites, most evident in the right upper quadrant, particularly adjacent to the gallbladder. Perinephric fluid bilaterally, as well as a small amount of peripancreatic fluid in the retroperitoneum. Musculoskeletal: No aggressive appearing osseous lesions are noted in the visualized portions of the skeleton. IMPRESSION: 1. Interval percutaneous cholecystostomy with decompression of the gallbladder. Previously noted gallstones are not confidently identified on today's examination. Gallbladder wall appears thickened and severely edematous with persistent pericholecystic fluid. 2. No choledocholithiasis or signs of biliary tract obstruction. 3. Periportal edema in the liver. 4. Edema in the retroperitoneum including both peripancreatic and perinephric fluid bilaterally. 5. Small right pleural effusion lying dependently with some associated passive subsegmental atelectasis in the right lower lobe. Electronically Signed   By: Vinnie Langton M.D.   On: 06/29/2020 06:15   MR ABDOMEN MRCP W WO CONTAST  Result Date: 06/29/2020 CLINICAL DATA:  59 year old female with history of jaundice. Cholelithiasis. EXAM: MRI ABDOMEN WITHOUT AND WITH CONTRAST (INCLUDING MRCP) TECHNIQUE: Multiplanar multisequence MR imaging of the abdomen was performed both before and after the administration of intravenous contrast. Heavily T2-weighted images of the biliary and pancreatic ducts were obtained, and three-dimensional MRCP images were rendered by post processing. CONTRAST:  25mL GADAVIST GADOBUTROL 1 MMOL/ML IV SOLN COMPARISON:  No prior abdominal MRI. CT the abdomen and pelvis 06/27/2020.  FINDINGS: Lower chest: Small right pleural effusion lying dependently with areas of probable passive atelectasis in the right lower lobe. Mild cardiomegaly. Hepatobiliary: In the periphery of segment 8 of the liver (axial image 25 of series 28) there is a 1.4 x 0.8 cm hypovascular region which is mildly T1 hypointense, mildly T2 hyperintense, and associated with small filling defects in the proximal aspect of the middle hepatic vein (axial image 21 of series 28). No other hepatic lesions are noted. No intra or extrahepatic biliary ductal dilatation noted on MRCP images. However, there is mild diffuse periportal edema. Interval placement of percutaneous cholecystostomy tube which resides within a decompressed gallbladder. Gallbladder wall appears mildly thickened but severely edematous. Common bile duct measures 3 mm in the porta hepatis. No filling defect in the common bile duct to suggest choledocholithiasis. Pancreas: No pancreatic mass. No pancreatic ductal dilatation. No pancreatic or peripancreatic fluid collections. Trace amount of peripancreatic fluid. Spleen:  Unremarkable. Adrenals/Urinary Tract: Subcentimeter T1 hypointense, T2 hyperintense, nonenhancing lesions in both kidneys, compatible with simple cysts. Small amount of perinephric fluid bilaterally. No hydroureteronephrosis in the visualized portions of the abdomen. Bilateral adrenal glands are normal in appearance. Stomach/Bowel: Unremarkable. Vascular/Lymphatic: Aortic atherosclerosis, without evidence  of aneurysm in the abdominal vasculature. No lymphadenopathy noted in the abdomen. Other: Small volume of ascites, most evident in the right upper quadrant, particularly adjacent to the gallbladder. Perinephric fluid bilaterally, as well as a small amount of peripancreatic fluid in the retroperitoneum. Musculoskeletal: No aggressive appearing osseous lesions are noted in the visualized portions of the skeleton. IMPRESSION: 1. Interval percutaneous  cholecystostomy with decompression of the gallbladder. Previously noted gallstones are not confidently identified on today's examination. Gallbladder wall appears thickened and severely edematous with persistent pericholecystic fluid. 2. No choledocholithiasis or signs of biliary tract obstruction. 3. Periportal edema in the liver. 4. Edema in the retroperitoneum including both peripancreatic and perinephric fluid bilaterally. 5. Small right pleural effusion lying dependently with some associated passive subsegmental atelectasis in the right lower lobe. Electronically Signed   By: Vinnie Langton M.D.   On: 06/29/2020 06:15   CT IMAGE GUIDED DRAINAGE BY PERCUTANEOUS CATHETER  Result Date: 06/28/2020 INDICATION: Acute cholecystitis. Poor operative candidate. Please perform image guided cholecystostomy tube placement for infection source control purposes. EXAM: ULTRASOUND AND FLUOROSCOPIC-GUIDED CHOLECYSTOSTOMY TUBE PLACEMENT COMPARISON:  CT abdomen and pelvis-06/27/2020 MEDICATIONS: The patient is currently admitted to the hospital and on intravenous antibiotics. Antibiotics were administered within an appropriate time frame prior to skin puncture. ANESTHESIA/SEDATION: Moderate (conscious) sedation was employed during this procedure. A total of Versed 0.5 mg and Fentanyl 25 mcg was administered intravenously. Moderate Sedation Time: 15 minutes. The patient's level of consciousness and vital signs were monitored continuously by radiology nursing throughout the procedure under my direct supervision. CONTRAST:  None FLUOROSCOPY TIME:  Not provided COMPLICATIONS: None immediate. PROCEDURE: Informed written consent was obtained from the patient after a discussion of the risks, benefits and alternatives to treatment. Questions regarding the procedure were encouraged and answered. A timeout was performed prior to the initiation of the procedure. Patient was placed supine on the CT gantry with CT imaging demonstrating  distension of the gallbladder with rather extensive amount pericholecystic fluid and layering echogenic gallstones or biliary sludge. The table position was marked and the gallbladder was identified sonographically. The right upper abdominal quadrant was prepped and draped in the usual sterile fashion, and a sterile drape was applied covering the operative field. Maximum barrier sterile technique with sterile gowns and gloves were used for the procedure. A timeout was performed prior to the initiation of the procedure. Local anesthesia was provided with 1% lidocaine with epinephrine. Ultrasound scanning of the right upper quadrant demonstrates a markedly dilated gallbladder with significant amount pericholecystic fluid. Of note, the patient reported pain with ultrasound imaging over the gallbladder. Utilizing a transhepatic approach, an 18 gauge needle was advanced into the gallbladder under direct ultrasound guidance. An ultrasound image was saved for documentation purposes. A short Amplatz wire was coiled within the gallbladder lumen. Appropriate positioning was confirmed with CT imaging. The track was dilated allowing placement of a 10 French percutaneous drainage catheter with end coiled and locked within the gallbladder lumen. At this point, approximately 80 cc of bile was aspirated from the gallbladder. A small amount of aspirated bile was capped sent to laboratory for analysis. The cholecystostomy tube was flushed with a small amount of saline and connected to a gravity bag. Obscured place within interrupted suture and a Stat Lock device. A dressing was applied. The patient tolerated the procedure well without immediate postprocedural complication. IMPRESSION: Successful ultrasound and CT guided placement of a 10.2 French cholecystostomy tube. A small amount of aspirated bile was sent to the laboratory  for analysis. Electronically Signed   By: Sandi Mariscal M.D.   On: 06/28/2020 11:41     Nutrition  Status:        CMP Latest Ref Rng & Units 06/29/2020 06/28/2020 06/27/2020  Glucose 70 - 99 mg/dL 117(H) 91 82  BUN 8 - 23 mg/dL 29(H) 20 16  Creatinine 0.44 - 1.00 mg/dL 2.65(H) 2.44(H) 1.88(H)  Sodium 135 - 145 mmol/L 136 137 138  Potassium 3.5 - 5.1 mmol/L 4.0 3.6 4.6  Chloride 98 - 111 mmol/L 101 104 103  CO2 22 - 32 mmol/L 17(L) 23 19(L)  Calcium 8.9 - 10.3 mg/dL 6.9(L) 7.4(L) 8.2(L)  Total Protein 6.5 - 8.1 g/dL 5.8(L) 5.9(L) 6.5  Total Bilirubin 0.3 - 1.2 mg/dL 2.8(H) 2.0(H) 1.9(H)  Alkaline Phos 38 - 126 U/L 134(H) 218(H) 329(H)  AST 15 - 41 U/L 902(H) 3,414(H) 6,003(H)  ALT 0 - 44 U/L 315(H) 536(H) 731(H)      Indwelling Urinary Catheter continued, requirement due to   Reason to continue Indwelling Urinary Catheter strict Intake/Output monitoring for hemodynamic instability   Central Line/ continued, requirement due to  Reason to continue Optima of central venous pressure or other hemodynamic parameters and poor IV access      ASSESSMENT AND PLAN SYNOPSIS ACUTE AND SEVERE SEPTIC SHOCK DUE TO ACUTE CHOLECYSTITIS  Septic shock -use vasopressors to keep MAP>65 -follow ABG and LA -follow up cultures -stress dose steroids -aggressive IV fluid Resuscitation   ACUTE KIDNEY INJURY/Renal Failure -continue Foley Catheter-assess need -Avoid nephrotoxic agents -Follow urine output, BMP -Ensure adequate renal perfusion, optimize oxygenation -Renal dose medications  CARDIAC ICU monitoring  ID E coli Bacteremia -continue IV abx as prescibed -follow up cultures  DIET-->TF's as tolerated and as per surgery Constipation protocol as indicated  ENDO - will use ICU hypoglycemic\Hyperglycemia protocol if indicated     ELECTROLYTES -follow labs as needed -replace as needed -pharmacy consultation and following   DVT/GI PRX ordered and assessed TRANSFUSIONS AS NEEDED MONITOR FSBS I Assessed the need for Labs I Assessed the need for Foley I  Assessed the need for Central Venous Line Family Discussion when available I Assessed the need for Mobilization I made an Assessment of medications to be adjusted accordingly Safety Risk assessment completed   CASE DISCUSSED IN MULTIDISCIPLINARY ROUNDS WITH ICU TEAM  Critical Care Time devoted to patient care services described in this note is 35 minutes.   Overall, patient is critically ill, prognosis is guarded.     Corrin Parker, M.D.  [facility]  Medical Director [place] Director [facility] Cardio-Pulmonary Department

## 2020-06-29 NOTE — Progress Notes (Signed)
Attempted to contact pts husband via his home phone number and cellular phone, however both went to voicemail.  Therefore, I left a voicemail message on his home phone number instructing him to return my phone call.   Marda Stalker, [place] Pager [phone] (please enter 7 digits) PCCM Consult Pager [phone] (please enter 7 digits)

## 2020-06-29 NOTE — Progress Notes (Signed)
Dr. Mortimer Fries notified patient has not made any urine and refuses foley catheter. Per MD orders give bolus. Continue to assess.

## 2020-06-29 NOTE — Progress Notes (Signed)
Septic shock from cholecystitis. Had a cholecystostomy tube drain yesterday.  Clinically with significant improvements however on paper she seems to have worsening acidosis.  Her pressor requirements have decreased. MRCP personally reviewed showing no evidence of choledocholithiasis.  Evidence of severe cholecystitis. Case discussed with Dr. Mortimer Fries in detail  PE NAD Abd: soft, chole drain in place, no peritonitis.  Mildly tender to palpation right upper quadrant.  A/P shock from acute cholecystitis Responded to cholecystostomy tube and antibiotics. 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 I have switched to D5W for D5 normal saline Albumin this morning He will eventually need cholecystectomy.  We will try not to do cholecystectomy during this hospitalization given her medical condition as well as the severe inflammatory response about the gallbladder. We will hold off on cholecystectomy at this time. Is note that I spent at least 40 minutes in this encounter with greater than 50% spent in coordination and counseling of his care Appreciate critical care assistance.

## 2020-06-29 NOTE — Progress Notes (Signed)
Per Dr Mortimer Fries orders for foley catheter. Patient explained the importance of keeping accurate count of her output due to her kidney function but refused.

## 2020-06-29 NOTE — Progress Notes (Signed)
Lactic acid 5.3. Dr Mortimer Fries ordered fluid bolus. Md made aware last night output 50 ml. At this time patient had bowel movement with some urine but unable to measure. Continue to assess. Purewick in place, patient refused foley catheter.

## 2020-06-29 NOTE — Progress Notes (Signed)
Report given to Dillion RN at 14:30 as this RN had to take and stay with another patient for a procedure.  

## 2020-06-30 DIAGNOSIS — A419 Sepsis, unspecified organism: Secondary | ICD-10-CM | POA: Diagnosis not present

## 2020-06-30 DIAGNOSIS — R6521 Severe sepsis with septic shock: Secondary | ICD-10-CM | POA: Diagnosis not present

## 2020-06-30 LAB — CBC WITH DIFFERENTIAL/PLATELET
Abs Immature Granulocytes: 0.09 10*3/uL — ABNORMAL HIGH (ref 0.00–0.07)
Basophils Absolute: 0.1 10*3/uL (ref 0.0–0.1)
Basophils Relative: 0 %
Eosinophils Absolute: 0 10*3/uL (ref 0.0–0.5)
Eosinophils Relative: 0 %
HCT: 27.6 % — ABNORMAL LOW (ref 36.0–46.0)
Hemoglobin: 9.4 g/dL — ABNORMAL LOW (ref 12.0–15.0)
Immature Granulocytes: 1 %
Lymphocytes Relative: 2 %
Lymphs Abs: 0.2 10*3/uL — ABNORMAL LOW (ref 0.7–4.0)
MCH: 35.6 pg — ABNORMAL HIGH (ref 26.0–34.0)
MCHC: 34.1 g/dL (ref 30.0–36.0)
MCV: 104.5 fL — ABNORMAL HIGH (ref 80.0–100.0)
Monocytes Absolute: 0.6 10*3/uL (ref 0.1–1.0)
Monocytes Relative: 4 %
Neutro Abs: 14.4 10*3/uL — ABNORMAL HIGH (ref 1.7–7.7)
Neutrophils Relative %: 93 %
Platelets: 88 10*3/uL — ABNORMAL LOW (ref 150–400)
RBC: 2.64 MIL/uL — ABNORMAL LOW (ref 3.87–5.11)
RDW: 15 % (ref 11.5–15.5)
Smear Review: NORMAL
WBC: 15.4 10*3/uL — ABNORMAL HIGH (ref 4.0–10.5)
nRBC: 0.8 % — ABNORMAL HIGH (ref 0.0–0.2)

## 2020-06-30 LAB — GLUCOSE, CAPILLARY
Glucose-Capillary: 111 mg/dL — ABNORMAL HIGH (ref 70–99)
Glucose-Capillary: 129 mg/dL — ABNORMAL HIGH (ref 70–99)
Glucose-Capillary: 132 mg/dL — ABNORMAL HIGH (ref 70–99)
Glucose-Capillary: 140 mg/dL — ABNORMAL HIGH (ref 70–99)
Glucose-Capillary: 141 mg/dL — ABNORMAL HIGH (ref 70–99)
Glucose-Capillary: 147 mg/dL — ABNORMAL HIGH (ref 70–99)

## 2020-06-30 LAB — COMPREHENSIVE METABOLIC PANEL
ALT: 215 U/L — ABNORMAL HIGH (ref 0–44)
AST: 394 U/L — ABNORMAL HIGH (ref 15–41)
Albumin: 2.8 g/dL — ABNORMAL LOW (ref 3.5–5.0)
Alkaline Phosphatase: 119 U/L (ref 38–126)
Anion gap: 16 — ABNORMAL HIGH (ref 5–15)
BUN: 36 mg/dL — ABNORMAL HIGH (ref 8–23)
CO2: 15 mmol/L — ABNORMAL LOW (ref 22–32)
Calcium: 6.6 mg/dL — ABNORMAL LOW (ref 8.9–10.3)
Chloride: 104 mmol/L (ref 98–111)
Creatinine, Ser: 2.75 mg/dL — ABNORMAL HIGH (ref 0.44–1.00)
GFR, Estimated: 19 mL/min — ABNORMAL LOW (ref >60)
Glucose, Bld: 139 mg/dL — ABNORMAL HIGH (ref 70–99)
Potassium: 3.6 mmol/L (ref 3.5–5.1)
Sodium: 135 mmol/L (ref 135–145)
Total Bilirubin: 2.8 mg/dL — ABNORMAL HIGH (ref 0.3–1.2)
Total Protein: 5.9 g/dL — ABNORMAL LOW (ref 6.5–8.1)

## 2020-06-30 LAB — CULTURE, BLOOD (SINGLE): Special Requests: ADEQUATE

## 2020-06-30 LAB — PROCALCITONIN: Procalcitonin: 48.92 ng/mL

## 2020-06-30 LAB — MAGNESIUM: Magnesium: 1.7 mg/dL (ref 1.7–2.4)

## 2020-06-30 LAB — PHOSPHORUS: Phosphorus: 6.6 mg/dL — ABNORMAL HIGH (ref 2.5–4.6)

## 2020-06-30 MED ORDER — INFLUENZA VAC SPLIT QUAD 0.5 ML IM SUSY
0.5000 mL | PREFILLED_SYRINGE | INTRAMUSCULAR | Status: AC
  Administered 2020-07-01: 0.5 mL via INTRAMUSCULAR
  Filled 2020-06-30 (×2): qty 0.5

## 2020-06-30 MED ORDER — LACTATED RINGERS IV BOLUS
1000.0000 mL | Freq: Once | INTRAVENOUS | Status: AC
  Administered 2020-06-30: 1000 mL via INTRAVENOUS

## 2020-06-30 MED ORDER — SODIUM CHLORIDE 0.9 % IV SOLN
2.0000 g | INTRAVENOUS | Status: DC
  Administered 2020-06-30 – 2020-07-06 (×7): 2 g via INTRAVENOUS
  Filled 2020-06-30 (×3): qty 2
  Filled 2020-06-30: qty 20
  Filled 2020-06-30: qty 2
  Filled 2020-06-30: qty 20
  Filled 2020-06-30 (×2): qty 2

## 2020-06-30 NOTE — Progress Notes (Signed)
PHARMACY CONSULT NOTE - FOLLOW UP  Pharmacy Consult for Electrolyte Monitoring and Replacement   Recent Labs: Potassium (mmol/L)  Date Value  06/30/2020 3.6  09/09/2014 3.6   Magnesium (mg/dL)  Date Value  06/30/2020 1.7   Calcium (mg/dL)  Date Value  06/30/2020 6.6 (L)   Calcium, Total (mg/dL)  Date Value  09/09/2014 9.6   Albumin (g/dL)  Date Value  06/30/2020 2.8 (L)  09/09/2014 3.8   Phosphorus (mg/dL)  Date Value  06/30/2020 6.6 (H)   Sodium (mmol/L)  Date Value  06/30/2020 135  09/09/2014 135 (L)     Assessment: 59 year old female with shock from acute cholecystitis. Elevation of LFTs. Patient s/p chole tube with culture sent. Patient requiring vasopressors and started on stress dose steroids. Blood culture with E.coli. Currently on Zosyn, pending de-escalation based on culture from drainage from chole tube and clinical improvement. Culture with E.coli and K.oxytoca.  MIVF: D5NS at 125 ml/hr  Goal of Therapy:  Electrolytes WNL   Plan:  Electrolytes stable, phos elevated. Creatinine continues to trend up. No interventions at this time. Will follow along.   Tawnya Crook ,PharmD Clinical Pharmacist 06/30/2020 12:54 PM

## 2020-06-30 NOTE — Progress Notes (Signed)
[facility] SURGICAL PROGRESS NOTE (cpt [phone])  Hospital Day(s): 3.   Interval History:  Patient seen and examined no acute events or new complaints overnight.  Patient reports she is feeling a little better She denies abdominal pain, nausea, emesis. No fever, chills.   She is weaning down on vasopressors, 5 mch/min Levophed and 0.03 units vasopressin  Leukocytosis remains slightly worse at 15.4k (from 14.7k yesterday).  Renal function worsening, sCr - 2.75, no UO in last 24 horus. She continues to refuse foley. She states "if you give me a diuretic I will pee" She is hyperphosphatemic, likely consistent with degree of renal failure.  Liver function remains elevated, T bili 2.8, AST/ALT improved some Percutaneous cholecystostomy tube with 95 ccs out in last 24 hours, bilious Cx from perc chole placement growing gram (+) rods, few gram (-) rods She is on CLD, tolerating well She has had multiple BMs in last 24 horus   Review of Systems:  Constitutional: denies fever, chills  HEENT: denies cough or congestion  Respiratory: denies any shortness of breath  Cardiovascular: denies chest pain or palpitations  Gastrointestinal: denies abdominal pain, N/V, or diarrhea/and bowel function as per interval history Genitourinary: denies burning with urination or urinary frequency  Vital signs in last 24 hours: [min-max] current  Temp:  [97.5 F (36.4 C)-98.6 F (37 C)] 97.6 F (36.4 C) (10/22 0400) Pulse Rate:  [83-97] 92 (10/22 0400) Resp:  [15-27] 23 (10/22 0400) BP: (80-109)/(54-87) 109/77 (10/22 0400) SpO2:  [88 %-96 %] 92 % (10/22 0400) Weight:  [94.4 kg] 94.4 kg (10/22 0419)     Height: 5\' 5"  (165.1 cm) Weight: 94.4 kg BMI (Calculated): 34.63   Intake/Output last 2 shifts:  10/21 0701 - 10/22 0700 In: 4828 [P.O.:120; I.V.:2983.8; IV Piggyback:1724.2] Out: 96 [Drains:95; Stool:1]   Physical Exam:  Constitutional: alert, cooperative and no distress  HENT:  normocephalic without obvious abnormality  Eyes: PERRL, EOM's grossly intact and symmetric  Respiratory: breathing non-labored at rest  Cardiovascular: regular rate and sinus rhythm  Gastrointestinal: Soft, no appreciable tenderness, non-distended, no rebound/guarding. Percutaneous cholecystostomy tube in RUQ with bilious output    Labs:  CBC Latest Ref Rng & Units 06/30/2020 06/29/2020 06/28/2020  WBC 4.0 - 10.5 K/uL 15.4(H) 14.7(H) 6.0  Hemoglobin 12.0 - 15.0 g/dL 9.4(L) 10.1(L) 9.3(L)  Hematocrit 36 - 46 % 27.6(L) 29.5(L) 26.7(L)  Platelets 150 - 400 K/uL 88(L) 119(L) 142(L)   CMP Latest Ref Rng & Units 06/30/2020 06/29/2020 06/28/2020  Glucose 70 - 99 mg/dL 139(H) 117(H) 91  BUN 8 - 23 mg/dL 36(H) 29(H) 20  Creatinine 0.44 - 1.00 mg/dL 2.75(H) 2.65(H) 2.44(H)  Sodium 135 - 145 mmol/L 135 136 137  Potassium 3.5 - 5.1 mmol/L 3.6 4.0 3.6  Chloride 98 - 111 mmol/L 104 101 104  CO2 22 - 32 mmol/L 15(L) 17(L) 23  Calcium 8.9 - 10.3 mg/dL 6.6(L) 6.9(L) 7.4(L)  Total Protein 6.5 - 8.1 g/dL 5.9(L) 5.8(L) 5.9(L)  Total Bilirubin 0.3 - 1.2 mg/dL 2.8(H) 2.8(H) 2.0(H)  Alkaline Phos 38 - 126 U/L 119 134(H) 218(H)  AST 15 - 41 U/L 394(H) 902(H) 3,414(H)  ALT 0 - 44 U/L 215(H) 315(H) 536(H)     Imaging studies: No new pertinent imaging studies   Assessment/Plan: (ICD-10's: K81.0) 59 y.o. female with septic shock and MSOF, requiring lesser degree of vasopressor support this morning, attributable to severe cholecystitis s/p percutaneous cholecystostomy tube placement on 10/20   - Appreciate PCCM assistance with this patient; wean  from vasopressors as toelrates  - Okay to continue CLD for now  - Continue IVF resuscitation  - Continue IV ABx (Zosyn); follow up Cx   - Monitor leukocytosis; lactic acidosis; renal function  - Continue percutaneous cholecystostomy tube; monitor and record output  - No emergent intervention; she understands that should she deteriorate or fail conservative  measures she would require cholecystectomy which would possibly require open procedure   - further management per primary; we will follow   All of the above findings and recommendations were discussed with the patient, and the medical team, and all of patient's questions were answered to her expressed satisfaction.  Face-to-face time spent with the patient and care providers was 45 minutes, with more than 50% of the time spent counseling, educating, and coordinating care of the patient  -- Edison Simon, PA-C  [facility] 06/30/2020, 7:39 AM [phone] M-F: 7am - 4pm

## 2020-06-30 NOTE — Progress Notes (Signed)
CRITICAL CARE NOTE   10/19 admitted septic shock 10/20 GB drain placed on pressors, E COLI bacteramia 10/22 on pressors, weaning down   CC  follow up septic shock  SUBJECTIVE critically ill On pressors Alert and awake   BP 109/77 (BP Location: Left Arm)    Pulse 92    Temp (!) 97.5 F (36.4 C) (Oral)    Resp (!) 23    Ht 5\' 5"  (1.651 m)    Wt 94.4 kg    SpO2 92%    BMI 34.63 kg/m    I/O last 3 completed shifts: In: 6779.7 [P.O.:120; I.V.:4834.8; IV Piggyback:1825] Out: 176 [Urine:50; Drains:125; Stool:1] No intake/output data recorded.  SpO2: 92 % O2 Flow Rate (L/min): 2 L/min  Estimated body mass index is 34.63 kg/m as calculated from the following:   Height as of this encounter: 5\' 5"  (1.651 m).   Weight as of this encounter: 94.4 kg.   Review of Systems:  Gen:  Denies  fever, sweats, chills weight loss  HEENT: Denies blurred vision, double vision, ear pain, eye pain, hearing loss, nose bleeds, sore throat Cardiac:  No dizziness, chest pain or heaviness, chest tightness,edema, No JVD Resp:   No cough, -sputum production, -shortness of breath,-wheezing, -hemoptysis,  Gi: Denies swallowing difficulty, stomach pain, nausea or vomiting, diarrhea, constipation, bowel incontinence Other:  All other systems negative        PHYSICAL EXAMINATION:  GENERAL: ill appearing,  PULMONARY: +rhonchi,  CARDIOVASCULAR: S1 and S2. Regular rate and rhythm. No murmurs, rubs, or gallops.  GASTROINTESTINAL: Soft, nontender, -distended.  Positive bowel sounds.  Drain inplace MUSCULOSKELETAL: No swelling, clubbing, or edema.  NEUROLOGIC: alert and awake SKIN:intact,warm,dry  MEDICATIONS: I have reviewed all medications and confirmed regimen as documented   CULTURE RESULTS   Recent Results (from the past 240 hour(s))  Blood culture (routine single)     Status: Abnormal   Collection Time: 06/27/20  7:51 PM   Specimen: BLOOD  Result Value Ref Range Status   Specimen  Description   Final    BLOOD LEFT ANTECUBITAL Performed at [facility], [address], [place], [place] [address]    Special Requests   Final    BOTTLES DRAWN AEROBIC AND ANAEROBIC Blood Culture adequate volume Performed at [facility], [address] [place], [place] [address]    Culture  Setup Time   Final    Organism ID to follow GRAM NEGATIVE RODS IN BOTH AEROBIC AND ANAEROBIC BOTTLES CRITICAL RESULT CALLED TO, READ BACK BY AND VERIFIED WITHLloyd Huger AT 5621 06/28/20 [place] Performed at [facility], [address] [place], [place] [address]    Culture ESCHERICHIA COLI (A)  Final   Report Status 06/30/2020 FINAL  Final   Organism ID, Bacteria ESCHERICHIA COLI  Final      Susceptibility   Escherichia coli - MIC*    AMPICILLIN >=32 RESISTANT Resistant     CEFAZOLIN <=4 SENSITIVE Sensitive     CEFEPIME <=0.12 SENSITIVE Sensitive     CEFTAZIDIME <=1 SENSITIVE Sensitive     CEFTRIAXONE <=0.25 SENSITIVE Sensitive     CIPROFLOXACIN <=0.25 SENSITIVE Sensitive     GENTAMICIN <=1 SENSITIVE Sensitive     IMIPENEM <=0.25 SENSITIVE Sensitive     TRIMETH/SULFA <=20 SENSITIVE Sensitive     AMPICILLIN/SULBACTAM >=32 RESISTANT Resistant     PIP/TAZO <=4 SENSITIVE Sensitive     * ESCHERICHIA COLI  Respiratory Panel by RT PCR (Flu A&B, Covid) - Nasopharyngeal Swab  Status: None   Collection Time: 06/27/20  7:51 PM   Specimen: Nasopharyngeal Swab  Result Value Ref Range Status   SARS Coronavirus 2 by RT PCR NEGATIVE NEGATIVE Final    Comment: (NOTE) SARS-CoV-2 target nucleic acids are NOT DETECTED.  The SARS-CoV-2 RNA is generally detectable in upper respiratoy specimens during the acute phase of infection. The lowest concentration of SARS-CoV-2 viral copies this assay can detect is 131 copies/mL. A negative result does not preclude SARS-Cov-2 infection and should not be used as the sole basis for treatment or other patient management decisions. A  negative result may occur with  improper specimen collection/handling, submission of specimen other than nasopharyngeal swab, presence of viral mutation(s) within the areas targeted by this assay, and inadequate number of viral copies (<131 copies/mL). A negative result must be combined with clinical observations, patient history, and epidemiological information. The expected result is Negative.  Fact Sheet for Patients:  PinkCheek.be  Fact Sheet for Healthcare Providers:  GravelBags.it  This test is no t yet approved or cleared by the [place] FDA and  has been authorized for detection and/or diagnosis of SARS-CoV-2 by FDA under an Emergency Use Authorization (EUA). This EUA will remain  in effect (meaning this test can be used) for the duration of the COVID-19 declaration under Section 564(b)(1) of the Act, 21 U.S.C. section 360bbb-3(b)(1), unless the authorization is terminated or revoked sooner.     Influenza A by PCR NEGATIVE NEGATIVE Final   Influenza B by PCR NEGATIVE NEGATIVE Final    Comment: (NOTE) The Xpert Xpress SARS-CoV-2/FLU/RSV assay is intended as an aid in  the diagnosis of influenza from Nasopharyngeal swab specimens and  should not be used as a sole basis for treatment. Nasal washings and  aspirates are unacceptable for Xpert Xpress SARS-CoV-2/FLU/RSV  testing.  Fact Sheet for Patients: PinkCheek.be  Fact Sheet for Healthcare Providers: GravelBags.it  This test is not yet approved or cleared by the [place] FDA and  has been authorized for detection and/or diagnosis of SARS-CoV-2 by  FDA under an Emergency Use Authorization (EUA). This EUA will remain  in effect (meaning this test can be used) for the duration of the  Covid-19 declaration under Section 564(b)(1) of the Act, 21  U.S.C. section 360bbb-3(b)(1), unless the authorization  is  terminated or revoked. Performed at [facility], [address] [place], [place] [address]   Blood Culture ID Panel (Reflexed)     Status: Abnormal   Collection Time: 06/27/20  7:51 PM  Result Value Ref Range Status   Enterococcus faecalis NOT DETECTED NOT DETECTED Final   Enterococcus Faecium NOT DETECTED NOT DETECTED Final   Listeria monocytogenes NOT DETECTED NOT DETECTED Final   Staphylococcus species NOT DETECTED NOT DETECTED Final   Staphylococcus aureus (BCID) NOT DETECTED NOT DETECTED Final   Staphylococcus epidermidis NOT DETECTED NOT DETECTED Final   Staphylococcus lugdunensis NOT DETECTED NOT DETECTED Final   Streptococcus species NOT DETECTED NOT DETECTED Final   Streptococcus agalactiae NOT DETECTED NOT DETECTED Final   Streptococcus pneumoniae NOT DETECTED NOT DETECTED Final   Streptococcus pyogenes NOT DETECTED NOT DETECTED Final   A.calcoaceticus-baumannii NOT DETECTED NOT DETECTED Final   Bacteroides fragilis NOT DETECTED NOT DETECTED Final   Enterobacterales DETECTED (A) NOT DETECTED Final    Comment: Enterobacterales represent a large order of gram negative bacteria, not a single organism. CRITICAL RESULT CALLED TO, READ BACK BY AND VERIFIED WITH:  NATHAN BELUE AT 4854 06/28/20 SDR  Enterobacter cloacae complex NOT DETECTED NOT DETECTED Final   Escherichia coli DETECTED (A) NOT DETECTED Final    Comment: CRITICAL RESULT CALLED TO, READ BACK BY AND VERIFIED WITH:  NATHAN BELUE AT 1497 06/28/20 SDR    Klebsiella aerogenes NOT DETECTED NOT DETECTED Final   Klebsiella oxytoca NOT DETECTED NOT DETECTED Final   Klebsiella pneumoniae NOT DETECTED NOT DETECTED Final   Proteus species NOT DETECTED NOT DETECTED Final   Salmonella species NOT DETECTED NOT DETECTED Final   Serratia marcescens NOT DETECTED NOT DETECTED Final   Haemophilus influenzae NOT DETECTED NOT DETECTED Final   Neisseria meningitidis NOT DETECTED NOT DETECTED Final   Pseudomonas  aeruginosa NOT DETECTED NOT DETECTED Final   Stenotrophomonas maltophilia NOT DETECTED NOT DETECTED Final   Candida albicans NOT DETECTED NOT DETECTED Final   Candida auris NOT DETECTED NOT DETECTED Final   Candida glabrata NOT DETECTED NOT DETECTED Final   Candida krusei NOT DETECTED NOT DETECTED Final   Candida parapsilosis NOT DETECTED NOT DETECTED Final   Candida tropicalis NOT DETECTED NOT DETECTED Final   Cryptococcus neoformans/gattii NOT DETECTED NOT DETECTED Final   CTX-M ESBL NOT DETECTED NOT DETECTED Final   Carbapenem resistance IMP NOT DETECTED NOT DETECTED Final   Carbapenem resistance KPC NOT DETECTED NOT DETECTED Final   Carbapenem resistance NDM NOT DETECTED NOT DETECTED Final   Carbapenem resist OXA 48 LIKE NOT DETECTED NOT DETECTED Final   Carbapenem resistance VIM NOT DETECTED NOT DETECTED Final    Comment: Performed at [facility], [address] [place], [place] [address]  MRSA PCR Screening     Status: None   Collection Time: 06/28/20  5:21 AM   Specimen: Nasopharyngeal  Result Value Ref Range Status   MRSA by PCR NEGATIVE NEGATIVE Final    Comment:        The GeneXpert MRSA Assay (FDA approved for NASAL specimens only), is one component of a comprehensive MRSA colonization surveillance program. It is not intended to diagnose MRSA infection nor to guide or monitor treatment for MRSA infections. Performed at [facility], [address] [place], [place] [address]   Aerobic/Anaerobic Culture (surgical/deep wound)     Status: None (Preliminary result)   Collection Time: 06/28/20 11:02 AM   Specimen: Fluid; Abscess  Result Value Ref Range Status   Specimen Description   Final    FLUID Performed at [facility], [address], [place], [place] [address]    Special Requests   Final    gallbladder fluid Performed at [facility], [place], [place] [address]    Gram Stain   Final    FEW WBC  PRESENT,BOTH PMN AND MONONUCLEAR MODERATE GRAM POSITIVE RODS    Culture   Final    FEW ESCHERICHIA COLI RARE KLEBSIELLA OXYTOCA CULTURE REINCUBATED FOR BETTER GROWTH Performed at [facility], [place] [address], [place], [place] [address]    Report Status PENDING  Incomplete          CMP Latest Ref Rng & Units 06/30/2020 06/29/2020 06/28/2020  Glucose 70 - 99 mg/dL 139(H) 117(H) 91  BUN 8 - 23 mg/dL 36(H) 29(H) 20  Creatinine 0.44 - 1.00 mg/dL 2.75(H) 2.65(H) 2.44(H)  Sodium 135 - 145 mmol/L 135 136 137  Potassium 3.5 - 5.1 mmol/L 3.6 4.0 3.6  Chloride 98 - 111 mmol/L 104 101 104  CO2 22 - 32 mmol/L 15(L) 17(L) 23  Calcium 8.9 - 10.3 mg/dL 6.6(L) 6.9(L) 7.4(L)  Total Protein 6.5 - 8.1 g/dL 5.9(L) 5.8(L) 5.9(L)  Total Bilirubin 0.3 - 1.2 mg/dL 2.8(H) 2.8(H) 2.0(H)  Alkaline Phos 38 - 126 U/L 119 134(H) 218(H)  AST 15 - 41 U/L 394(H) 902(H) 3,414(H)  ALT 0 - 44 U/L 215(H) 315(H) 536(H)      Indwelling Urinary Catheter continued, requirement due to   Reason to continue Indwelling Urinary Catheter strict Intake/Output monitoring for hemodynamic instability   Central Line/ continued, requirement due to  Reason to continue [place] of central venous pressure or other hemodynamic parameters and poor IV access      ASSESSMENT AND PLAN SYNOPSIS ACUTE AND SEVERE SEPTIC SHOCK DUE TO ACUTE CHOLECYSTITIS  Septic shock -use vasopressors to keep MAP>65   ACUTE KIDNEY INJURY/Renal Failure -continue Foley Catheter-assess need -Avoid nephrotoxic agents -Follow urine output, BMP -Ensure adequate renal perfusion, optimize oxygenation -Renal dose medications  ID E coli Bacteremia -continue IV abx as prescibed -follow up cultures Change to Rocephin   DIET--> as tolerated and as per surgery Constipation protocol as indicated  ELECTROLYTES -follow labs as needed -replace as needed -pharmacy consultation and following   DVT/GI PRX ordered and  assessed TRANSFUSIONS AS NEEDED MONITOR FSBS I Assessed the need for Labs I Assessed the need for Foley I Assessed the need for Central Venous Line Family Discussion when available I Assessed the need for Mobilization I made an Assessment of medications to be adjusted accordingly Safety Risk assessment completed  CASE DISCUSSED IN MULTIDISCIPLINARY ROUNDS WITH ICU TEAM    Saachi Zale Patricia Pesa, M.D.  [facility] Pulmonary & Critical Care Medicine  Medical Director [place] Director [place] Department

## 2020-07-01 DIAGNOSIS — R7881 Bacteremia: Secondary | ICD-10-CM | POA: Diagnosis not present

## 2020-07-01 DIAGNOSIS — K810 Acute cholecystitis: Secondary | ICD-10-CM | POA: Diagnosis not present

## 2020-07-01 DIAGNOSIS — R6521 Severe sepsis with septic shock: Secondary | ICD-10-CM | POA: Diagnosis not present

## 2020-07-01 DIAGNOSIS — A419 Sepsis, unspecified organism: Secondary | ICD-10-CM | POA: Diagnosis not present

## 2020-07-01 LAB — COMPREHENSIVE METABOLIC PANEL
ALT: 172 U/L — ABNORMAL HIGH (ref 0–44)
AST: 201 U/L — ABNORMAL HIGH (ref 15–41)
Albumin: 2.8 g/dL — ABNORMAL LOW (ref 3.5–5.0)
Alkaline Phosphatase: 198 U/L — ABNORMAL HIGH (ref 38–126)
Anion gap: 15 (ref 5–15)
BUN: 41 mg/dL — ABNORMAL HIGH (ref 8–23)
CO2: 16 mmol/L — ABNORMAL LOW (ref 22–32)
Calcium: 7 mg/dL — ABNORMAL LOW (ref 8.9–10.3)
Chloride: 102 mmol/L (ref 98–111)
Creatinine, Ser: 3.11 mg/dL — ABNORMAL HIGH (ref 0.44–1.00)
GFR, Estimated: 16 mL/min — ABNORMAL LOW (ref >60)
Glucose, Bld: 165 mg/dL — ABNORMAL HIGH (ref 70–99)
Potassium: 3.9 mmol/L (ref 3.5–5.1)
Sodium: 133 mmol/L — ABNORMAL LOW (ref 135–145)
Total Bilirubin: 2.3 mg/dL — ABNORMAL HIGH (ref 0.3–1.2)
Total Protein: 6.2 g/dL — ABNORMAL LOW (ref 6.5–8.1)

## 2020-07-01 LAB — CBC WITH DIFFERENTIAL/PLATELET
Abs Immature Granulocytes: 0.16 10*3/uL — ABNORMAL HIGH (ref 0.00–0.07)
Basophils Absolute: 0 10*3/uL (ref 0.0–0.1)
Basophils Relative: 0 %
Eosinophils Absolute: 0 10*3/uL (ref 0.0–0.5)
Eosinophils Relative: 0 %
HCT: 27.3 % — ABNORMAL LOW (ref 36.0–46.0)
Hemoglobin: 9.9 g/dL — ABNORMAL LOW (ref 12.0–15.0)
Immature Granulocytes: 2 %
Lymphocytes Relative: 4 %
Lymphs Abs: 0.4 10*3/uL — ABNORMAL LOW (ref 0.7–4.0)
MCH: 37.2 pg — ABNORMAL HIGH (ref 26.0–34.0)
MCHC: 36.3 g/dL — ABNORMAL HIGH (ref 30.0–36.0)
MCV: 102.6 fL — ABNORMAL HIGH (ref 80.0–100.0)
Monocytes Absolute: 0.7 10*3/uL (ref 0.1–1.0)
Monocytes Relative: 7 %
Neutro Abs: 8.5 10*3/uL — ABNORMAL HIGH (ref 1.7–7.7)
Neutrophils Relative %: 87 %
Platelets: 83 10*3/uL — ABNORMAL LOW (ref 150–400)
RBC: 2.66 MIL/uL — ABNORMAL LOW (ref 3.87–5.11)
RDW: 15.3 % (ref 11.5–15.5)
WBC: 9.8 10*3/uL (ref 4.0–10.5)
nRBC: 1.6 % — ABNORMAL HIGH (ref 0.0–0.2)

## 2020-07-01 LAB — GLUCOSE, CAPILLARY
Glucose-Capillary: 149 mg/dL — ABNORMAL HIGH (ref 70–99)
Glucose-Capillary: 152 mg/dL — ABNORMAL HIGH (ref 70–99)
Glucose-Capillary: 150 mg/dL — ABNORMAL HIGH (ref 70–99)
Glucose-Capillary: 142 mg/dL — ABNORMAL HIGH (ref 70–99)
Glucose-Capillary: 112 mg/dL — ABNORMAL HIGH (ref 70–99)
Glucose-Capillary: 122 mg/dL — ABNORMAL HIGH (ref 70–99)

## 2020-07-01 LAB — PROTIME-INR
INR: 1 (ref 0.8–1.2)
Prothrombin Time: 12.7 seconds (ref 11.4–15.2)

## 2020-07-01 MED ORDER — FUROSEMIDE 10 MG/ML IJ SOLN
40.0000 mg | Freq: Once | INTRAMUSCULAR | Status: AC
  Administered 2020-07-01: 40 mg via INTRAVENOUS
  Filled 2020-07-01: qty 4

## 2020-07-01 MED ORDER — SODIUM CHLORIDE 0.9% FLUSH
5.0000 mL | Freq: Three times a day (TID) | INTRAVENOUS | Status: DC
  Administered 2020-07-01 – 2020-07-07 (×14): 5 mL

## 2020-07-01 MED ORDER — ALBUMIN HUMAN 25 % IV SOLN
25.0000 g | Freq: Once | INTRAVENOUS | Status: AC
  Administered 2020-07-01: 25 g via INTRAVENOUS
  Filled 2020-07-01: qty 100

## 2020-07-01 NOTE — Progress Notes (Signed)
[facility] SURGICAL PROGRESS NOTE (cpt [phone])  Hospital Day(s): 4.   Interval History:  Patient seen and examined no acute events or new complaints overnight.  Patient reports she is feeling a little better, concerned about how much fluid she has been getting She denies abdominal pain, nausea, emesis. No fever, chills.   She is weaning down on vasopressors, 2 mcg/min Levophed and 0.03 units vasopressin  Leukocytosis has resolved .  Renal function continues to worsen, sCr - 3.1, only 50 cc urine recorded in the last 24 horus. She continues to refuse foley. She states "if you give me a diuretic I will pee".  Tbili still elevated at 2.3; transaminases continue to improve Percutaneous cholecystostomy tube with 100 ccs out in last 24 hours, bilious Cx from perc chole placement growing E. Coli and Klebsiella She is on CLD, tolerating well She has had multiple BMs in last 24 horus   Review of Systems:  Constitutional: denies fever, chills  HEENT: denies cough or congestion  Respiratory: denies any shortness of breath  Cardiovascular: denies chest pain or palpitations  Gastrointestinal: denies abdominal pain, N/V, or diarrhea/and bowel function as per interval history Genitourinary: denies burning with urination or urinary frequency  Vital signs in last 24 hours: [min-max] current  Temp:  [97.6 F (36.4 C)-97.9 F (36.6 C)] 97.8 F (36.6 C) (10/23 0800) Pulse Rate:  [61-142] 61 (10/23 0800) Resp:  [12-32] 24 (10/23 0800) BP: (67-118)/(59-88) 107/88 (10/23 0800) SpO2:  [92 %-100 %] 93 % (10/23 0800) Weight:  [97.6 kg] 97.6 kg (10/23 0500)     Height: 5\' 5"  (165.1 cm) Weight: 97.6 kg BMI (Calculated): 35.81   Intake/Output last 2 shifts:  10/22 0701 - 10/23 0700 In: 3569 [I.V.:3519; IV Piggyback:50] Out: 150 [Urine:50; Drains:100]   Physical Exam:  Constitutional: alert, cooperative and no distress  HENT: normocephalic without obvious abnormality  Eyes: PERRL,  EOM's grossly intact and symmetric  Respiratory: breathing non-labored at rest  Cardiovascular: regular rate and sinus rhythm  Gastrointestinal: Soft, no appreciable tenderness, non-distended, no rebound/guarding. Percutaneous cholecystostomy tube in RUQ with bilious output    Labs:  CBC Latest Ref Rng & Units 07/01/2020 06/30/2020 06/29/2020  WBC 4.0 - 10.5 K/uL 9.8 15.4(H) 14.7(H)  Hemoglobin 12.0 - 15.0 g/dL 9.9(L) 9.4(L) 10.1(L)  Hematocrit 36 - 46 % 27.3(L) 27.6(L) 29.5(L)  Platelets 150 - 400 K/uL 83(L) 88(L) 119(L)   CMP Latest Ref Rng & Units 07/01/2020 06/30/2020 06/29/2020  Glucose 70 - 99 mg/dL 165(H) 139(H) 117(H)  BUN 8 - 23 mg/dL 41(H) 36(H) 29(H)  Creatinine 0.44 - 1.00 mg/dL 3.11(H) 2.75(H) 2.65(H)  Sodium 135 - 145 mmol/L 133(L) 135 136  Potassium 3.5 - 5.1 mmol/L 3.9 3.6 4.0  Chloride 98 - 111 mmol/L 102 104 101  CO2 22 - 32 mmol/L 16(L) 15(L) 17(L)  Calcium 8.9 - 10.3 mg/dL 7.0(L) 6.6(L) 6.9(L)  Total Protein 6.5 - 8.1 g/dL 6.2(L) 5.9(L) 5.8(L)  Total Bilirubin 0.3 - 1.2 mg/dL 2.3(H) 2.8(H) 2.8(H)  Alkaline Phos 38 - 126 U/L 198(H) 119 134(H)  AST 15 - 41 U/L 201(H) 394(H) 902(H)  ALT 0 - 44 U/L 172(H) 215(H) 315(H)     Imaging studies: No new pertinent imaging studies   Assessment/Plan: (ICD-10's: K81.0) 59 y.o. female with septic shock and MSOF due to acute cholecystitis. Pressor requirements down today, however her renal function continues to worsen. She is s/p percutaneous cholecystostomy tube placement on 10/20, growing E coli and Klebsiella from bile cultures.   -  Appreciate PCCM assistance with this patient; wean from vasopressors as toelrates  - Okay to continue CLD for now  - Continue IVF resuscitation  - Continue IV ABx (Zosyn); follow up sensitivities  - Monitor leukocytosis; lactic acidosis; renal function  - Continue percutaneous cholecystostomy tube; monitor and record output  - No emergent intervention; she understands that should she  deteriorate or fail conservative measures she would require cholecystectomy which would possibly require open procedure   - further management per primary; we will follow

## 2020-07-01 NOTE — Progress Notes (Signed)
Follow up - Critical Care Medicine Note  Patient Details:    Carrie Hardy is an 59 y.o. female presented to [facility] on 10/19 with severe sepsis due to cholecystitis requiring vasopressors.  Admitted to ICU for further management.  Lines, Airways, Drains: CVC Triple Lumen 06/28/20 Right Femoral (Active)  Indication for Insertion or Continuance of Line Vasoactive infusions 07/01/20 0800  Site Assessment Clean;Dry;Intact 07/01/20 0800  Proximal Lumen Status Infusing 07/01/20 0800  Medial Lumen Status Infusing 07/01/20 0800  Distal Lumen Status Infusing 07/01/20 0800  Dressing Type Transparent;Occlusive 07/01/20 0800  Dressing Status Clean;Dry;Intact 07/01/20 0800  Antimicrobial disc in place? Yes 07/01/20 0800  Line Care Connections checked and tightened 07/01/20 0800  Dressing Intervention Dressing changed 06/30/20 0400  Dressing Change Due 07/07/20 07/01/20 0800     Biliary Tube RLQ (Active)  Site Description Unremarkable 07/01/20 1200  Dressing Status Clean;Dry;Intact 07/01/20 1200  Drainage Color Brown 07/01/20 1200  Tube Status Open to gravity drainage 07/01/20 1200  Output (mL) 90 mL 07/01/20 0630     External Urinary Catheter (Active)  Collection Container Dedicated Suction Canister 07/01/20 1200  Securement Method Securing device (Describe) 07/01/20 1200  Site Assessment Clean;Intact 07/01/20 1200  Intervention Equipment Changed 06/30/20 2015  Output (mL) 50 mL 07/01/20 0700    Anti-infectives:  Anti-infectives (From admission, onward)   Start     Dose/Rate Route Frequency Ordered Stop   06/30/20 1700  cefTRIAXone (ROCEPHIN) 2 g in sodium chloride 0.9 % 100 mL IVPB        2 g 200 mL/hr over 30 Minutes Intravenous Every 24 hours 06/30/20 1552     06/27/20 2315  piperacillin-tazobactam (ZOSYN) IVPB 3.375 g  Status:  Discontinued        3.375 g 12.5 mL/hr over 240 Minutes Intravenous Every 8 hours 06/27/20 2312 06/30/20 1552   06/27/20 2100  metroNIDAZOLE (FLAGYL) IVPB  500 mg        500 mg 100 mL/hr over 60 Minutes Intravenous  Once 06/27/20 2054 06/27/20 2225   06/27/20 1930  cefTRIAXone (ROCEPHIN) 2 g in sodium chloride 0.9 % 100 mL IVPB        2 g 200 mL/hr over 30 Minutes Intravenous  Once 06/27/20 1926 06/27/20 2053     Scheduled Meds: . Chlorhexidine Gluconate Cloth  6 each Topical Q0600  . hydrocortisone sod succinate (SOLU-CORTEF) inj  50 mg Intravenous Q6H  . influenza vac split quadrivalent PF  0.5 mL Intramuscular Tomorrow-1000  . pantoprazole (PROTONIX) IV  40 mg Intravenous QHS  . sodium chloride flush  5 mL Intracatheter Q8H   Continuous Infusions: . sodium chloride Stopped (06/28/20 0741)  . cefTRIAXone (ROCEPHIN)  IV Stopped (06/30/20 1900)  . dextrose 5 % and 0.9% NaCl 125 mL/hr at 07/01/20 1523  . norepinephrine (LEVOPHED) Adult infusion 3 mcg/min (07/01/20 0700)  . vasopressin 0.04 Units/min (07/01/20 1249)   PRN Meds:.docusate sodium, loperamide, morphine injection, ondansetron (ZOFRAN) IV, polyethylene glycol   Microbiology: Results for orders placed or performed during the hospital encounter of 06/27/20  Blood culture (routine single)     Status: Abnormal   Collection Time: 06/27/20  7:51 PM   Specimen: BLOOD  Result Value Ref Range Status   Specimen Description   Final    BLOOD LEFT ANTECUBITAL Performed at [facility], [address] [place], [place] [address]    Special Requests   Final    BOTTLES DRAWN AEROBIC AND ANAEROBIC Blood Culture adequate volume Performed at [facility],  [place], [place] [address]    Culture  Setup Time   Final    Organism ID to follow GRAM NEGATIVE RODS IN BOTH AEROBIC AND ANAEROBIC BOTTLES CRITICAL RESULT CALLED TO, READ BACK BY AND VERIFIED WITH: Lloyd Huger AT [id number] 06/28/20 [place] Performed at [facility], [address] [place], [place] [address]    Culture ESCHERICHIA COLI (A)  Final   Report Status 06/30/2020 FINAL  Final    Organism ID, Bacteria ESCHERICHIA COLI  Final      Susceptibility   Escherichia coli - MIC*    AMPICILLIN >=32 RESISTANT Resistant     CEFAZOLIN <=4 SENSITIVE Sensitive     CEFEPIME <=0.12 SENSITIVE Sensitive     CEFTAZIDIME <=1 SENSITIVE Sensitive     CEFTRIAXONE <=0.25 SENSITIVE Sensitive     CIPROFLOXACIN <=0.25 SENSITIVE Sensitive     GENTAMICIN <=1 SENSITIVE Sensitive     IMIPENEM <=0.25 SENSITIVE Sensitive     TRIMETH/SULFA <=20 SENSITIVE Sensitive     AMPICILLIN/SULBACTAM >=32 RESISTANT Resistant     PIP/TAZO <=4 SENSITIVE Sensitive     * ESCHERICHIA COLI  Respiratory Panel by RT PCR (Flu A&B, Covid) - Nasopharyngeal Swab     Status: None   Collection Time: 06/27/20  7:51 PM   Specimen: Nasopharyngeal Swab  Result Value Ref Range Status   SARS Coronavirus 2 by RT PCR NEGATIVE NEGATIVE Final    Comment: (NOTE) SARS-CoV-2 target nucleic acids are NOT DETECTED.  The SARS-CoV-2 RNA is generally detectable in upper respiratoy specimens during the acute phase of infection. The lowest concentration of SARS-CoV-2 viral copies this assay can detect is 131 copies/mL. A negative result does not preclude SARS-Cov-2 infection and should not be used as the sole basis for treatment or other patient management decisions. A negative result may occur with  improper specimen collection/handling, submission of specimen other than nasopharyngeal swab, presence of viral mutation(s) within the areas targeted by this assay, and inadequate number of viral copies (<131 copies/mL). A negative result must be combined with clinical observations, patient history, and epidemiological information. The expected result is Negative.  Fact Sheet for Patients:  PinkCheek.be  Fact Sheet for Healthcare Providers:  GravelBags.it  This test is no t yet approved or cleared by the [place] FDA and  has been authorized for detection and/or diagnosis  of SARS-CoV-2 by FDA under an Emergency Use Authorization (EUA). This EUA will remain  in effect (meaning this test can be used) for the duration of the COVID-19 declaration under Section 564(b)(1) of the Act, 21 U.S.C. section 360bbb-3(b)(1), unless the authorization is terminated or revoked sooner.     Influenza A by PCR NEGATIVE NEGATIVE Final   Influenza B by PCR NEGATIVE NEGATIVE Final    Comment: (NOTE) The Xpert Xpress SARS-CoV-2/FLU/RSV assay is intended as an aid in  the diagnosis of influenza from Nasopharyngeal swab specimens and  should not be used as a sole basis for treatment. Nasal washings and  aspirates are unacceptable for Xpert Xpress SARS-CoV-2/FLU/RSV  testing.  Fact Sheet for Patients: PinkCheek.be  Fact Sheet for Healthcare Providers: GravelBags.it  This test is not yet approved or cleared by the [place] FDA and  has been authorized for detection and/or diagnosis of SARS-CoV-2 by  FDA under an Emergency Use Authorization (EUA). This EUA will remain  in effect (meaning this test can be used) for the duration of the  Covid-19 declaration under Section 564(b)(1) of the Act, 21  U.S.C. section  360bbb-3(b)(1), unless the authorization is  terminated or revoked. Performed at [facility], [address] [place], [place] [address]   Blood Culture ID Panel (Reflexed)     Status: Abnormal   Collection Time: 06/27/20  7:51 PM  Result Value Ref Range Status   Enterococcus faecalis NOT DETECTED NOT DETECTED Final   Enterococcus Faecium NOT DETECTED NOT DETECTED Final   Listeria monocytogenes NOT DETECTED NOT DETECTED Final   Staphylococcus species NOT DETECTED NOT DETECTED Final   Staphylococcus aureus (BCID) NOT DETECTED NOT DETECTED Final   Staphylococcus epidermidis NOT DETECTED NOT DETECTED Final   Staphylococcus lugdunensis NOT DETECTED NOT DETECTED Final   Streptococcus species NOT  DETECTED NOT DETECTED Final   Streptococcus agalactiae NOT DETECTED NOT DETECTED Final   Streptococcus pneumoniae NOT DETECTED NOT DETECTED Final   Streptococcus pyogenes NOT DETECTED NOT DETECTED Final   A.calcoaceticus-baumannii NOT DETECTED NOT DETECTED Final   Bacteroides fragilis NOT DETECTED NOT DETECTED Final   Enterobacterales DETECTED (A) NOT DETECTED Final    Comment: Enterobacterales represent a large order of gram negative bacteria, not a single organism. CRITICAL RESULT CALLED TO, READ BACK BY AND VERIFIED WITH:  NATHAN BELUE AT 9030 06/28/20 SDR    Enterobacter cloacae complex NOT DETECTED NOT DETECTED Final   Escherichia coli DETECTED (A) NOT DETECTED Final    Comment: CRITICAL RESULT CALLED TO, READ BACK BY AND VERIFIED WITH:  NATHAN BELUE AT 0923 06/28/20 SDR    Klebsiella aerogenes NOT DETECTED NOT DETECTED Final   Klebsiella oxytoca NOT DETECTED NOT DETECTED Final   Klebsiella pneumoniae NOT DETECTED NOT DETECTED Final   Proteus species NOT DETECTED NOT DETECTED Final   Salmonella species NOT DETECTED NOT DETECTED Final   Serratia marcescens NOT DETECTED NOT DETECTED Final   Haemophilus influenzae NOT DETECTED NOT DETECTED Final   Neisseria meningitidis NOT DETECTED NOT DETECTED Final   Pseudomonas aeruginosa NOT DETECTED NOT DETECTED Final   Stenotrophomonas maltophilia NOT DETECTED NOT DETECTED Final   Candida albicans NOT DETECTED NOT DETECTED Final   Candida auris NOT DETECTED NOT DETECTED Final   Candida glabrata NOT DETECTED NOT DETECTED Final   Candida krusei NOT DETECTED NOT DETECTED Final   Candida parapsilosis NOT DETECTED NOT DETECTED Final   Candida tropicalis NOT DETECTED NOT DETECTED Final   Cryptococcus neoformans/gattii NOT DETECTED NOT DETECTED Final   CTX-M ESBL NOT DETECTED NOT DETECTED Final   Carbapenem resistance IMP NOT DETECTED NOT DETECTED Final   Carbapenem resistance KPC NOT DETECTED NOT DETECTED Final   Carbapenem resistance NDM NOT  DETECTED NOT DETECTED Final   Carbapenem resist OXA 48 LIKE NOT DETECTED NOT DETECTED Final   Carbapenem resistance VIM NOT DETECTED NOT DETECTED Final    Comment: Performed at [facility], [address] [place], [place] [address]  MRSA PCR Screening     Status: None   Collection Time: 06/28/20  5:21 AM   Specimen: Nasopharyngeal  Result Value Ref Range Status   MRSA by PCR NEGATIVE NEGATIVE Final    Comment:        The GeneXpert MRSA Assay (FDA approved for NASAL specimens only), is one component of a comprehensive MRSA colonization surveillance program. It is not intended to diagnose MRSA infection nor to guide or monitor treatment for MRSA infections. Performed at [facility], [address] [place], [place] [address]   Aerobic/Anaerobic Culture (surgical/deep wound)     Status: None (Preliminary result)   Collection Time: 06/28/20 11:02 AM  Specimen: Fluid; Abscess  Result Value Ref Range Status   Specimen Description   Final    FLUID Performed at [facility], [address], [place], [place] [address]    Special Requests   Final    gallbladder fluid Performed at [facility], [place], [place] [address]    Gram Stain   Final    FEW WBC PRESENT,BOTH PMN AND MONONUCLEAR MODERATE GRAM POSITIVE RODS Performed at [facility], [place] [address] [place], [place] [address]    Culture   Final    FEW ESCHERICHIA COLI RARE KLEBSIELLA OXYTOCA SUSCEPTIBILITIES TO FOLLOW NO ANAEROBES ISOLATED; CULTURE IN PROGRESS FOR 5 DAYS    Report Status PENDING  Incomplete   Results for orders placed or performed during the hospital encounter of 06/27/20 (from the past 24 hour(s))  Glucose, capillary     Status: Abnormal   Collection Time: 06/30/20  7:19 PM  Result Value Ref Range   Glucose-Capillary 147 (H) 70 - 99 mg/dL  Glucose, capillary     Status: Abnormal   Collection Time: 06/30/20 11:36 PM  Result Value Ref  Range   Glucose-Capillary 141 (H) 70 - 99 mg/dL  Glucose, capillary     Status: Abnormal   Collection Time: 07/01/20  3:11 AM  Result Value Ref Range   Glucose-Capillary 149 (H) 70 - 99 mg/dL  CBC with Differential/Platelet     Status: Abnormal   Collection Time: 07/01/20  6:25 AM  Result Value Ref Range   WBC 9.8 4.0 - 10.5 K/uL   RBC 2.66 (L) 3.87 - 5.11 MIL/uL   Hemoglobin 9.9 (L) 12.0 - 15.0 g/dL   HCT 27.3 (L) 36 - 46 %   MCV 102.6 (H) 80.0 - 100.0 fL   MCH 37.2 (H) 26.0 - 34.0 pg   MCHC 36.3 (H) 30.0 - 36.0 g/dL   RDW 15.3 11.5 - 15.5 %   Platelets 83 (L) 150 - 400 K/uL   nRBC 1.6 (H) 0.0 - 0.2 %   Neutrophils Relative % 87 %   Neutro Abs 8.5 (H) 1.7 - 7.7 K/uL   Lymphocytes Relative 4 %   Lymphs Abs 0.4 (L) 0.7 - 4.0 K/uL   Monocytes Relative 7 %   Monocytes Absolute 0.7 0.1 - 1.0 K/uL   Eosinophils Relative 0 %   Eosinophils Absolute 0.0 0.0 - 0.5 K/uL   Basophils Relative 0 %   Basophils Absolute 0.0 0.0 - 0.1 K/uL   Immature Granulocytes 2 %   Abs Immature Granulocytes 0.16 (H) 0.00 - 0.07 K/uL  Protime-INR     Status: None   Collection Time: 07/01/20  6:25 AM  Result Value Ref Range   Prothrombin Time 12.7 11.4 - 15.2 seconds   INR 1.0 0.8 - 1.2  Comprehensive metabolic panel     Status: Abnormal   Collection Time: 07/01/20  6:25 AM  Result Value Ref Range   Sodium 133 (L) 135 - 145 mmol/L   Potassium 3.9 3.5 - 5.1 mmol/L   Chloride 102 98 - 111 mmol/L   CO2 16 (L) 22 - 32 mmol/L   Glucose, Bld 165 (H) 70 - 99 mg/dL   BUN 41 (H) 8 - 23 mg/dL   Creatinine, Ser 3.11 (H) 0.44 - 1.00 mg/dL   Calcium 7.0 (L) 8.9 - 10.3 mg/dL   Total Protein 6.2 (L) 6.5 - 8.1 g/dL   Albumin 2.8 (L) 3.5 - 5.0 g/dL   AST 201 (  H) 15 - 41 U/L   ALT 172 (H) 0 - 44 U/L   Alkaline Phosphatase 198 (H) 38 - 126 U/L   Total Bilirubin 2.3 (H) 0.3 - 1.2 mg/dL   GFR, Estimated 16 (L) >60 mL/min   Anion gap 15 5 - 15  Glucose, capillary     Status: Abnormal   Collection Time: 07/01/20   7:31 AM  Result Value Ref Range   Glucose-Capillary 152 (H) 70 - 99 mg/dL  Glucose, capillary     Status: Abnormal   Collection Time: 07/01/20 11:14 AM  Result Value Ref Range   Glucose-Capillary 150 (H) 70 - 99 mg/dL  Glucose, capillary     Status: Abnormal   Collection Time: 07/01/20  4:15 PM  Result Value Ref Range   Glucose-Capillary 142 (H) 70 - 99 mg/dL     Best Practice/Protocols:  VTE Prophylaxis: Mechanical GI Prophylaxis: Proton Pump Inhibitor Hyperglycemia (ICU) Heparin SQ held due to developing thrombocytopenia   Events: 10/19 Admit to ICU 10/20 GB drain placed on pressors, E COLI bacteramia 10/22 on pressors, weaning down 10/23 renal function worsening   Studies: CT HEAD WO CONTRAST  Result Date: 06/27/2020 CLINICAL DATA:  Lightheaded. EXAM: CT HEAD WITHOUT CONTRAST TECHNIQUE: Contiguous axial images were obtained from the base of the skull through the vertex without intravenous contrast. COMPARISON:  None. FINDINGS: Brain: There is mild cerebral atrophy with widening of the extra-axial spaces and ventricular dilatation. There are areas of decreased attenuation within the white matter tracts of the supratentorial brain, consistent with microvascular disease changes. Vascular: No hyperdense vessel or unexpected calcification. Skull: Normal. Negative for fracture or focal lesion. Sinuses/Orbits: No acute finding. Other: None. IMPRESSION: No acute intracranial pathology. Electronically Signed   By: Virgina Norfolk M.D.   On: 06/27/2020 18:10   [place] Intraoperative  Result Date: 06/28/2020 INDICATION: Acute cholecystitis. Poor operative candidate. Please perform image guided cholecystostomy tube placement for infection source control purposes. EXAM: ULTRASOUND AND FLUOROSCOPIC-GUIDED CHOLECYSTOSTOMY TUBE PLACEMENT COMPARISON:  CT abdomen and pelvis-06/27/2020 MEDICATIONS: The patient is currently admitted to the hospital and on intravenous antibiotics. Antibiotics were  administered within an appropriate time frame prior to skin puncture. ANESTHESIA/SEDATION: Moderate (conscious) sedation was employed during this procedure. A total of Versed 0.5 mg and Fentanyl 25 mcg was administered intravenously. Moderate Sedation Time: 15 minutes. The patient's level of consciousness and vital signs were monitored continuously by radiology nursing throughout the procedure under my direct supervision. CONTRAST:  None FLUOROSCOPY TIME:  Not provided COMPLICATIONS: None immediate. PROCEDURE: Informed written consent was obtained from the patient after a discussion of the risks, benefits and alternatives to treatment. Questions regarding the procedure were encouraged and answered. A timeout was performed prior to the initiation of the procedure. Patient was placed supine on the CT gantry with CT imaging demonstrating distension of the gallbladder with rather extensive amount pericholecystic fluid and layering echogenic gallstones or biliary sludge. The table position was marked and the gallbladder was identified sonographically. The right upper abdominal quadrant was prepped and draped in the usual sterile fashion, and a sterile drape was applied covering the operative field. Maximum barrier sterile technique with sterile gowns and gloves were used for the procedure. A timeout was performed prior to the initiation of the procedure. Local anesthesia was provided with 1% lidocaine with epinephrine. Ultrasound scanning of the right upper quadrant demonstrates a markedly dilated gallbladder with significant amount pericholecystic fluid. Of note, the patient reported pain with ultrasound imaging over the gallbladder. Utilizing  a transhepatic approach, an 18 gauge needle was advanced into the gallbladder under direct ultrasound guidance. An ultrasound image was saved for documentation purposes. A short Amplatz wire was coiled within the gallbladder lumen. Appropriate positioning was confirmed with CT  imaging. The track was dilated allowing placement of a 10 French percutaneous drainage catheter with end coiled and locked within the gallbladder lumen. At this point, approximately 80 cc of bile was aspirated from the gallbladder. A small amount of aspirated bile was capped sent to laboratory for analysis. The cholecystostomy tube was flushed with a small amount of saline and connected to a gravity bag. Obscured place within interrupted suture and a Stat Lock device. A dressing was applied. The patient tolerated the procedure well without immediate postprocedural complication. IMPRESSION: Successful ultrasound and CT guided placement of a 10.2 French cholecystostomy tube. A small amount of aspirated bile was sent to the laboratory for analysis. Electronically Signed   By: Sandi Mariscal M.D.   On: 06/28/2020 11:41   CT ABDOMEN PELVIS W CONTRAST  Result Date: 06/27/2020 CLINICAL DATA:  Elevated LFTs EXAM: CT ABDOMEN AND PELVIS WITH CONTRAST TECHNIQUE: Multidetector CT imaging of the abdomen and pelvis was performed using the standard protocol following bolus administration of intravenous contrast. CONTRAST:  15mL OMNIPAQUE IOHEXOL 300 MG/ML  SOLN COMPARISON:  Ultrasound from earlier in the same day, CT from 03/27/2016. FINDINGS: Lower chest: No acute abnormality. Hepatobiliary: Fatty infiltration of the liver is noted. The gallbladder is well distended with multiple dependent gallstones. Mild pericholecystic inflammatory changes are noted similar to that seen on prior ultrasound. No biliary ductal dilatation is seen. Pancreas: Unremarkable. No pancreatic ductal dilatation or surrounding inflammatory changes. Spleen: Normal in size without focal abnormality. Adrenals/Urinary Tract: Adrenal glands are within normal limits. Kidneys demonstrate a normal enhancement pattern bilaterally. No renal calculi or obstructive changes are seen. Delayed images demonstrate no significant excretion although this is expected given  the patient's poor renal function. Bladder is partially distended. Stomach/Bowel: No obstructive or inflammatory changes of the colon are seen. The appendix is within normal limits. Small bowel shows some postsurgical changes. No obstructive changes are noted. Postsurgical changes are noted in normal stomach as well consistent with prior bariatric surgery. Vascular/Lymphatic: Aortic atherosclerosis. No enlarged abdominal or pelvic lymph nodes. Reproductive: Uterus is well visualized and demonstrates a central hypodense mass lesion. This likely represents a degenerating fibroid given the more diffuse enhancement on prior CT examination. Other: No abdominal wall hernia or abnormality. No abdominopelvic ascites. Musculoskeletal: Degenerative changes of lumbar spine are noted. IMPRESSION: Dilated gallbladder with wall thickening and pericholecystic inflammatory change in cholelithiasis similar to that seen on prior ultrasound examination. Again HIDA scan may be helpful to assess for gallbladder function/cystic duct patency. Hypodense lesion within the uterus which measures approximately 3.3 cm. This likely represents a degenerating fibroid given the findings on prior CT. Chronic changes as described above. Electronically Signed   By: Inez Catalina M.D.   On: 06/27/2020 23:22   MR 3D Recon At Scanner  Result Date: 06/29/2020 CLINICAL DATA:  59 year old female with history of jaundice. Cholelithiasis. EXAM: MRI ABDOMEN WITHOUT AND WITH CONTRAST (INCLUDING MRCP) TECHNIQUE: Multiplanar multisequence MR imaging of the abdomen was performed both before and after the administration of intravenous contrast. Heavily T2-weighted images of the biliary and pancreatic ducts were obtained, and three-dimensional MRCP images were rendered by post processing. CONTRAST:  16mL GADAVIST GADOBUTROL 1 MMOL/ML IV SOLN COMPARISON:  No prior abdominal MRI. CT the abdomen and pelvis 06/27/2020.  FINDINGS: Lower chest: Small right pleural  effusion lying dependently with areas of probable passive atelectasis in the right lower lobe. Mild cardiomegaly. Hepatobiliary: In the periphery of segment 8 of the liver (axial image 25 of series 28) there is a 1.4 x 0.8 cm hypovascular region which is mildly T1 hypointense, mildly T2 hyperintense, and associated with small filling defects in the proximal aspect of the middle hepatic vein (axial image 21 of series 28). No other hepatic lesions are noted. No intra or extrahepatic biliary ductal dilatation noted on MRCP images. However, there is mild diffuse periportal edema. Interval placement of percutaneous cholecystostomy tube which resides within a decompressed gallbladder. Gallbladder wall appears mildly thickened but severely edematous. Common bile duct measures 3 mm in the porta hepatis. No filling defect in the common bile duct to suggest choledocholithiasis. Pancreas: No pancreatic mass. No pancreatic ductal dilatation. No pancreatic or peripancreatic fluid collections. Trace amount of peripancreatic fluid. Spleen:  Unremarkable. Adrenals/Urinary Tract: Subcentimeter T1 hypointense, T2 hyperintense, nonenhancing lesions in both kidneys, compatible with simple cysts. Small amount of perinephric fluid bilaterally. No hydroureteronephrosis in the visualized portions of the abdomen. Bilateral adrenal glands are normal in appearance. Stomach/Bowel: Unremarkable. Vascular/Lymphatic: Aortic atherosclerosis, without evidence of aneurysm in the abdominal vasculature. No lymphadenopathy noted in the abdomen. Other: Small volume of ascites, most evident in the right upper quadrant, particularly adjacent to the gallbladder. Perinephric fluid bilaterally, as well as a small amount of peripancreatic fluid in the retroperitoneum. Musculoskeletal: No aggressive appearing osseous lesions are noted in the visualized portions of the skeleton. IMPRESSION: 1. Interval percutaneous cholecystostomy with decompression of the  gallbladder. Previously noted gallstones are not confidently identified on today's examination. Gallbladder wall appears thickened and severely edematous with persistent pericholecystic fluid. 2. No choledocholithiasis or signs of biliary tract obstruction. 3. Periportal edema in the liver. 4. Edema in the retroperitoneum including both peripancreatic and perinephric fluid bilaterally. 5. Small right pleural effusion lying dependently with some associated passive subsegmental atelectasis in the right lower lobe. Electronically Signed   By: Vinnie Langton M.D.   On: 06/29/2020 06:15   DG Chest Port 1 View  Result Date: 06/28/2020 CLINICAL DATA:  Respiratory failure. EXAM: PORTABLE CHEST 1 VIEW COMPARISON:  Chest x-ray 06/27/2020. FINDINGS: Heart size stable. Low lung volumes. Very mild bilateral interstitial prominence noted. Mild pneumonitis can not be excluded. Persistent mild bibasilar subsegmental atelectasis. No pleural effusion or pneumothorax. IMPRESSION: Low lung volumes. Very mild bilateral interstitial prominence noted. Mild pneumonitis can not be excluded. Persistent mild bibasilar subsegmental atelectasis. Electronically Signed   By: Marcello Moores  Register   On: 06/28/2020 07:56   DG Chest Port 1 View  Result Date: 06/27/2020 CLINICAL DATA:  Possible sepsis EXAM: PORTABLE CHEST 1 VIEW COMPARISON:  None. FINDINGS: Minimal atelectasis left base. No consolidation or effusion. Normal heart size. Aortic atherosclerosis. No pneumothorax. IMPRESSION: No active disease. Electronically Signed   By: Donavan Foil M.D.   On: 06/27/2020 19:00   MR ABDOMEN MRCP W WO CONTAST  Result Date: 06/29/2020 CLINICAL DATA:  59 year old female with history of jaundice. Cholelithiasis. EXAM: MRI ABDOMEN WITHOUT AND WITH CONTRAST (INCLUDING MRCP) TECHNIQUE: Multiplanar multisequence MR imaging of the abdomen was performed both before and after the administration of intravenous contrast. Heavily T2-weighted images of the  biliary and pancreatic ducts were obtained, and three-dimensional MRCP images were rendered by post processing. CONTRAST:  58mL GADAVIST GADOBUTROL 1 MMOL/ML IV SOLN COMPARISON:  No prior abdominal MRI. CT the abdomen and pelvis 06/27/2020. FINDINGS: Lower  chest: Small right pleural effusion lying dependently with areas of probable passive atelectasis in the right lower lobe. Mild cardiomegaly. Hepatobiliary: In the periphery of segment 8 of the liver (axial image 25 of series 28) there is a 1.4 x 0.8 cm hypovascular region which is mildly T1 hypointense, mildly T2 hyperintense, and associated with small filling defects in the proximal aspect of the middle hepatic vein (axial image 21 of series 28). No other hepatic lesions are noted. No intra or extrahepatic biliary ductal dilatation noted on MRCP images. However, there is mild diffuse periportal edema. Interval placement of percutaneous cholecystostomy tube which resides within a decompressed gallbladder. Gallbladder wall appears mildly thickened but severely edematous. Common bile duct measures 3 mm in the porta hepatis. No filling defect in the common bile duct to suggest choledocholithiasis. Pancreas: No pancreatic mass. No pancreatic ductal dilatation. No pancreatic or peripancreatic fluid collections. Trace amount of peripancreatic fluid. Spleen:  Unremarkable. Adrenals/Urinary Tract: Subcentimeter T1 hypointense, T2 hyperintense, nonenhancing lesions in both kidneys, compatible with simple cysts. Small amount of perinephric fluid bilaterally. No hydroureteronephrosis in the visualized portions of the abdomen. Bilateral adrenal glands are normal in appearance. Stomach/Bowel: Unremarkable. Vascular/Lymphatic: Aortic atherosclerosis, without evidence of aneurysm in the abdominal vasculature. No lymphadenopathy noted in the abdomen. Other: Small volume of ascites, most evident in the right upper quadrant, particularly adjacent to the gallbladder. Perinephric  fluid bilaterally, as well as a small amount of peripancreatic fluid in the retroperitoneum. Musculoskeletal: No aggressive appearing osseous lesions are noted in the visualized portions of the skeleton. IMPRESSION: 1. Interval percutaneous cholecystostomy with decompression of the gallbladder. Previously noted gallstones are not confidently identified on today's examination. Gallbladder wall appears thickened and severely edematous with persistent pericholecystic fluid. 2. No choledocholithiasis or signs of biliary tract obstruction. 3. Periportal edema in the liver. 4. Edema in the retroperitoneum including both peripancreatic and perinephric fluid bilaterally. 5. Small right pleural effusion lying dependently with some associated passive subsegmental atelectasis in the right lower lobe. Electronically Signed   By: Vinnie Langton M.D.   On: 06/29/2020 06:15   CT IMAGE GUIDED DRAINAGE BY PERCUTANEOUS CATHETER  Result Date: 06/28/2020 INDICATION: Acute cholecystitis. Poor operative candidate. Please perform image guided cholecystostomy tube placement for infection source control purposes. EXAM: ULTRASOUND AND FLUOROSCOPIC-GUIDED CHOLECYSTOSTOMY TUBE PLACEMENT COMPARISON:  CT abdomen and pelvis-06/27/2020 MEDICATIONS: The patient is currently admitted to the hospital and on intravenous antibiotics. Antibiotics were administered within an appropriate time frame prior to skin puncture. ANESTHESIA/SEDATION: Moderate (conscious) sedation was employed during this procedure. A total of Versed 0.5 mg and Fentanyl 25 mcg was administered intravenously. Moderate Sedation Time: 15 minutes. The patient's level of consciousness and vital signs were monitored continuously by radiology nursing throughout the procedure under my direct supervision. CONTRAST:  None FLUOROSCOPY TIME:  Not provided COMPLICATIONS: None immediate. PROCEDURE: Informed written consent was obtained from the patient after a discussion of the risks,  benefits and alternatives to treatment. Questions regarding the procedure were encouraged and answered. A timeout was performed prior to the initiation of the procedure. Patient was placed supine on the CT gantry with CT imaging demonstrating distension of the gallbladder with rather extensive amount pericholecystic fluid and layering echogenic gallstones or biliary sludge. The table position was marked and the gallbladder was identified sonographically. The right upper abdominal quadrant was prepped and draped in the usual sterile fashion, and a sterile drape was applied covering the operative field. Maximum barrier sterile technique with sterile gowns and gloves were used for  the procedure. A timeout was performed prior to the initiation of the procedure. Local anesthesia was provided with 1% lidocaine with epinephrine. Ultrasound scanning of the right upper quadrant demonstrates a markedly dilated gallbladder with significant amount pericholecystic fluid. Of note, the patient reported pain with ultrasound imaging over the gallbladder. Utilizing a transhepatic approach, an 18 gauge needle was advanced into the gallbladder under direct ultrasound guidance. An ultrasound image was saved for documentation purposes. A short Amplatz wire was coiled within the gallbladder lumen. Appropriate positioning was confirmed with CT imaging. The track was dilated allowing placement of a 10 French percutaneous drainage catheter with end coiled and locked within the gallbladder lumen. At this point, approximately 80 cc of bile was aspirated from the gallbladder. A small amount of aspirated bile was capped sent to laboratory for analysis. The cholecystostomy tube was flushed with a small amount of saline and connected to a gravity bag. Obscured place within interrupted suture and a Stat Lock device. A dressing was applied. The patient tolerated the procedure well without immediate postprocedural complication. IMPRESSION: Successful  ultrasound and CT guided placement of a 10.2 French cholecystostomy tube. A small amount of aspirated bile was sent to the laboratory for analysis. Electronically Signed   By: Sandi Mariscal M.D.   On: 06/28/2020 11:41   US ABDOMEN LIMITED RUQ (LIVER/GB)  Result Date: 06/27/2020 CLINICAL DATA:  Elevated LFT EXAM: ULTRASOUND ABDOMEN LIMITED RIGHT UPPER QUADRANT COMPARISON:  CT 03/27/2016 FINDINGS: Gallbladder: Multiple shadowing stones in the gallbladder. Increased gallbladder wall thickness measuring 7.3 mm. Negative sonographic Murphy. Edematous appearing gallbladder wall with small amount of pericholecystic fluid. Common bile duct: Diameter: 3.6 mm Liver: Liver is echogenic. Previously described liver mass is not seen sonographically. Portal vein is patent on color Doppler imaging with normal direction of blood flow towards the liver. Other: Small amount of free fluid in the right upper quadrant IMPRESSION: 1. Cholelithiasis with gallbladder wall thickening/edema and small amount of pericholecystic fluid but negative sonographic Percell Miller, concern is raised for cholecystitis despite negative sonographic Murphy. Correlation with nuclear medicine hepatobiliary imaging could be obtained for further evaluation 2. Diffusely echogenic liver consistent with steatosis and or hepatocellular disease 3. Small amount of right upper quadrant ascites Electronically Signed   By: Donavan Foil M.D.   On: 06/27/2020 22:51    Consults: Treatment Team:  Jules Husbands, MD   Subjective:    Overnight Issues: Only complaint is of swelling all over.  Pressors are being weaned.  Renal function worsening.  Declines Foley catheter.  No evidence of urinary retention by bladder scan.  ROS: A 10 point review of systems was performed and it is as noted above otherwise negative.    Objective:  Vital signs for last 24 hours: Temp:  [97.5 F (36.4 C)-97.9 F (36.6 C)] 97.5 F (36.4 C) (10/23 1256) Pulse Rate:  [61-142] 84  (10/23 1600) Resp:  [12-32] 20 (10/23 1700) BP: (67-118)/(44-88) 94/81 (10/23 1600) SpO2:  [92 %-100 %] 96 % (10/23 1600) Weight:  [97.6 kg] 97.6 kg (10/23 0500)  Hemodynamic parameters for last 24 hours:    Intake/Output from previous day: 10/22 0701 - 10/23 0700 In: 3569 [I.V.:3519; IV Piggyback:50] Out: 150 [Urine:50; Drains:100]  Intake/Output this shift: Total I/O In: 1110.6 [I.V.:1110.6] Out: -   Vent settings for last 24 hours:    Physical Exam:  GENERAL: Obese woman, no acute respiratory distress.  Awake, alert. HEAD: Normocephalic, atraumatic.  EYES: Pupils equal, round, reactive to light.  No scleral  icterus.  MOUTH: Oral mucosa moist.  No thrush. NECK: Supple. No thyromegaly. Trachea midline. No JVD.  No adenopathy. PULMONARY: Good air entry bilaterally.  No adventitious sounds. CARDIOVASCULAR: S1 and S2. Regular rate and rhythm.  ABDOMEN: Obese, soft, no tenderness.  Percutaneous cholecystostomy tube right upper quadrant with bilious output (approximately 100 cc overnight) MUSCULOSKELETAL: No joint deformity, no clubbing, anasarca 2+  NEUROLOGIC: Awake, alert, no overt focal deficit.  Fluent speech. SKIN: Intact,warm,dry. PSYCH: Mood and behavior appropriate.  Assessment/Plan:   Severe Sepsis Due to cholecystitis Lactic Acidosis, resolved Continue supportive care Right upper quadrant drain placed Continue antibiotics Wean pressors as tolerated On stress dose steroids Trend lactic acid/procalcitonin General surgery following  Anion Gap Metabolic Acidosis Acute Renal Failure Trend lactic acid Decrease IV fluids Continues to decline Foley Difficult to assess I's and O's Trial of albumin/Lasix Renal consultation if no response  Transaminitis due to cholecystitis  Complicated by chronic hepatic steatosis Transaminases on downward trend Biliary drain for cholecystitis in place Continue supportive care  ID E. coli and Klebsiella  bacteremia Source is gallbladder Percutaneous biliary drain Ceftriaxone  HTN HLD home medications: amlodipine and metoprolol succinate on hold in the setting of hypotension Will restart home lipitor once patient is tolerating PO meds     LOS: 4 days   Additional comments: Husband at bedside, apprised.  Critical Care Total Time*: Follow-up level 3  C. Derrill Kay, MD [facility] 07/01/2020  *Care during the described time interval was provided by me and/or other providers on the critical care team.  I have reviewed this patient's available data, including medical history, events of note, physical examination and test results as part of my evaluation.  **This note was dictated using voice recognition software/Dragon.  Despite best efforts to proofread, errors can occur which can change the meaning.  Any change was purely unintentional.

## 2020-07-01 NOTE — Progress Notes (Signed)
PHARMACY CONSULT NOTE - FOLLOW UP  Pharmacy Consult for Electrolyte Monitoring and Replacement   Recent Labs: Potassium (mmol/L)  Date Value  07/01/2020 3.9  09/09/2014 3.6   Magnesium (mg/dL)  Date Value  06/30/2020 1.7   Calcium (mg/dL)  Date Value  07/01/2020 7.0 (L)   Calcium, Total (mg/dL)  Date Value  09/09/2014 9.6   Albumin (g/dL)  Date Value  07/01/2020 2.8 (L)  09/09/2014 3.8   Phosphorus (mg/dL)  Date Value  06/30/2020 6.6 (H)   Sodium (mmol/L)  Date Value  07/01/2020 133 (L)  09/09/2014 135 (L)     Assessment: 59 year old female with shock from acute cholecystitis. Elevation of LFTs. Patient s/p chole tube with culture sent. Patient requiring vasopressors and started on stress dose steroids. Blood culture with E.coli. Abscess culture with E.coli and K.oxytoca.  MIVF: D5NS at 125 ml/hr  Goal of Therapy:  Electrolytes WNL   Plan:  Electrolytes stable, phos elevated. Creatinine continues to trend up. No interventions at this time. Will follow along.   Oswald Hillock ,PharmD, BCPS Clinical Pharmacist 07/01/2020 7:48 AM

## 2020-07-02 ENCOUNTER — Inpatient Hospital Stay (HOSPITAL_COMMUNITY)
Admit: 2020-07-02 | Discharge: 2020-07-02 | Disposition: A | Discharge status: Home / Self Care | Payer: Federal, State, Local not specified - PPO | Attending: Pulmonary Disease | Admitting: Pulmonary Disease

## 2020-07-02 DIAGNOSIS — I351 Nonrheumatic aortic (valve) insufficiency: Secondary | ICD-10-CM

## 2020-07-02 DIAGNOSIS — R6521 Severe sepsis with septic shock: Secondary | ICD-10-CM | POA: Diagnosis not present

## 2020-07-02 DIAGNOSIS — A419 Sepsis, unspecified organism: Secondary | ICD-10-CM | POA: Diagnosis not present

## 2020-07-02 DIAGNOSIS — K810 Acute cholecystitis: Secondary | ICD-10-CM | POA: Diagnosis not present

## 2020-07-02 DIAGNOSIS — I361 Nonrheumatic tricuspid (valve) insufficiency: Secondary | ICD-10-CM

## 2020-07-02 DIAGNOSIS — R7881 Bacteremia: Secondary | ICD-10-CM | POA: Diagnosis not present

## 2020-07-02 DIAGNOSIS — I340 Nonrheumatic mitral (valve) insufficiency: Secondary | ICD-10-CM

## 2020-07-02 LAB — ECHOCARDIOGRAM COMPLETE
AR max vel: 2.05 cm2
AV Area VTI: 2.43 cm2
AV Area mean vel: 2.14 cm2
AV Mean grad: 3 mmHg
AV Peak grad: 5.3 mmHg
Ao pk vel: 1.15 m/s
Area-P 1/2: 3.83 cm2
Calc EF: 38.7 %
Height: 65 in
S' Lateral: 3.93 cm
Single Plane A2C EF: 41.2 %
Single Plane A4C EF: 39.9 %
Weight: 3442.7 oz

## 2020-07-02 LAB — CBC WITH DIFFERENTIAL/PLATELET
Abs Immature Granulocytes: 0.31 10*3/uL — ABNORMAL HIGH (ref 0.00–0.07)
Basophils Absolute: 0 10*3/uL (ref 0.0–0.1)
Basophils Relative: 0 %
Eosinophils Absolute: 0 10*3/uL (ref 0.0–0.5)
Eosinophils Relative: 0 %
HCT: 27.3 % — ABNORMAL LOW (ref 36.0–46.0)
Hemoglobin: 9.6 g/dL — ABNORMAL LOW (ref 12.0–15.0)
Immature Granulocytes: 4 %
Lymphocytes Relative: 6 %
Lymphs Abs: 0.6 10*3/uL — ABNORMAL LOW (ref 0.7–4.0)
MCH: 36 pg — ABNORMAL HIGH (ref 26.0–34.0)
MCHC: 35.2 g/dL (ref 30.0–36.0)
MCV: 102.2 fL — ABNORMAL HIGH (ref 80.0–100.0)
Monocytes Absolute: 0.8 10*3/uL (ref 0.1–1.0)
Monocytes Relative: 9 %
Neutro Abs: 7.2 10*3/uL (ref 1.7–7.7)
Neutrophils Relative %: 81 %
Platelets: 86 10*3/uL — ABNORMAL LOW (ref 150–400)
RBC: 2.67 MIL/uL — ABNORMAL LOW (ref 3.87–5.11)
RDW: 15.3 % (ref 11.5–15.5)
Smear Review: NORMAL
WBC: 8.9 10*3/uL (ref 4.0–10.5)
nRBC: 1.2 % — ABNORMAL HIGH (ref 0.0–0.2)

## 2020-07-02 LAB — BASIC METABOLIC PANEL
Anion gap: 12 (ref 5–15)
BUN: 47 mg/dL — ABNORMAL HIGH (ref 8–23)
CO2: 17 mmol/L — ABNORMAL LOW (ref 22–32)
Calcium: 7.4 mg/dL — ABNORMAL LOW (ref 8.9–10.3)
Chloride: 106 mmol/L (ref 98–111)
Creatinine, Ser: 3.22 mg/dL — ABNORMAL HIGH (ref 0.44–1.00)
GFR, Estimated: 16 mL/min — ABNORMAL LOW (ref >60)
Glucose, Bld: 130 mg/dL — ABNORMAL HIGH (ref 70–99)
Potassium: 3.6 mmol/L (ref 3.5–5.1)
Sodium: 135 mmol/L (ref 135–145)

## 2020-07-02 LAB — MAGNESIUM: Magnesium: 2 mg/dL (ref 1.7–2.4)

## 2020-07-02 LAB — CREATININE, URINE, RANDOM: Creatinine, Urine: 48 mg/dL

## 2020-07-02 LAB — GLUCOSE, CAPILLARY
Glucose-Capillary: 115 mg/dL — ABNORMAL HIGH (ref 70–99)
Glucose-Capillary: 117 mg/dL — ABNORMAL HIGH (ref 70–99)
Glucose-Capillary: 119 mg/dL — ABNORMAL HIGH (ref 70–99)
Glucose-Capillary: 121 mg/dL — ABNORMAL HIGH (ref 70–99)
Glucose-Capillary: 131 mg/dL — ABNORMAL HIGH (ref 70–99)

## 2020-07-02 LAB — SODIUM, URINE, RANDOM: Sodium, Ur: <10 mmol/L

## 2020-07-02 MED ORDER — SODIUM BICARBONATE 8.4 % IV SOLN
INTRAVENOUS | Status: DC
  Filled 2020-07-02 (×7): qty 1000

## 2020-07-02 NOTE — Progress Notes (Signed)
PHARMACY CONSULT NOTE - FOLLOW UP  Pharmacy Consult for Electrolyte Monitoring and Replacement   Recent Labs: Potassium (mmol/L)  Date Value  07/02/2020 3.6  09/09/2014 3.6   Magnesium (mg/dL)  Date Value  07/02/2020 2.0   Calcium (mg/dL)  Date Value  07/02/2020 7.4 (L)   Calcium, Total (mg/dL)  Date Value  09/09/2014 9.6   Albumin (g/dL)  Date Value  07/01/2020 2.8 (L)  09/09/2014 3.8   Phosphorus (mg/dL)  Date Value  06/30/2020 6.6 (H)   Sodium (mmol/L)  Date Value  07/02/2020 135  09/09/2014 135 (L)     Assessment: 59 year old female with shock from acute cholecystitis. Elevation of LFTs. Patient s/p chole tube with culture sent. Patient requiring vasopressors and started on stress dose steroids. Blood culture with E.coli. Abscess culture with E.coli and K.oxytoca.  MIVF: D5NS at 125 ml/hr  Goal of Therapy:  Electrolytes WNL   Plan:  Electrolytes stable, phos elevated. Creatinine continues to trend up. No interventions at this time. Will follow along.   Oswald Hillock ,PharmD, BCPS Clinical Pharmacist 07/02/2020 9:27 AM

## 2020-07-02 NOTE — Consult Note (Signed)
CRISTY COLMENARES MRN: [id number] DOB/AGE: [birth year] 59 y.o. Primary Care Physician:White, Orlene Och, NP Admit date: 06/27/2020 Chief Complaint:  Chief Complaint  Patient presents with  . Dizziness   HPI: Patient is a 59 year old African-American female with a past medical history of hypertension who came to the ER on October 19 with chief complaint of dizziness.  History of present illness dates back to October 18 when patient is had onset of slurred speech and dizziness.  Patient came to ER, upon evaluation in the ER patient was found to have a systolic blood pressure of less than 70 mmHg patient was diaphoretic with tachycardia.  On further work-up patient was found to have acute cholecystitis including CT contrast and acute kidney injury with creatinine of 1.8 Patient then developed septic shock from acute cholecystitis and was admitted to ICU.  Patient was thereafter seen by surgery and interventional radiology.  Patient underwent percutaneous cholecystotomy tube, patient remains admitted to ICU.  Patient seen today in ICU. Patient offers no new specific complaints. No complaint of chest pain/shortness of breath No complaint of nausea No complaint fever cough or chills Patient asked me relevant question about kidney related issues. I did educate patient about her kidney disease the possible need for dialysis. Patient was adamant that she does not wishes for dialysis at this time. I discussed with the patient about her urine output, I discussed the patient that we do possibly need a Foley catheter again patient was adamant that she does not wishes Foley catheter as she already has a tube in her belly.    Past Medical History:  Diagnosis Date  . Hypertension       No family history on file. No family history of ESRD   Social History:  reports that she has never smoked. She has never used smokeless tobacco. She reports previous alcohol use. She reports previous drug  use.   Allergies:  Allergies  Allergen Reactions  . Tramadol Itching    Medications Prior to Admission  Medication Sig Dispense Refill  . amLODipine (NORVASC) 10 MG tablet Take 10 mg by mouth daily.    Marland Kitchen atorvastatin (LIPITOR) 40 MG tablet Take 40 mg by mouth at bedtime.    . metoprolol succinate (TOPROL-XL) 50 MG 24 hr tablet Take 50 mg by mouth daily.         VCB:SWHQP from the symptoms mentioned above,there are no other symptoms referable to all systems reviewed.  . Chlorhexidine Gluconate Cloth  6 each Topical Q0600  . hydrocortisone sod succinate (SOLU-CORTEF) inj  50 mg Intravenous Q6H  . pantoprazole (PROTONIX) IV  40 mg Intravenous QHS  . sodium chloride flush  5 mL Intracatheter Q8H      Physical Exam: Vital signs in last 24 hours: Temp:  [97.9 F (36.6 C)-98 F (36.7 C)] 98 F (36.7 C) (10/24 0800) Pulse Rate:  [33-100] 91 (10/24 1300) Resp:  [12-25] 22 (10/24 1300) BP: (80-132)/(44-108) 118/83 (10/24 1300) SpO2:  [88 %-100 %] 97 % (10/24 1300) Weight change:  Last BM Date: 07/01/20  Intake/Output from previous day: 10/23 0701 - 10/24 0700 In: 2283.2 [I.V.:2083.2; IV Piggyback:200] Out: 5916 [Urine:825; Drains:190] No intake/output data recorded.   Physical Exam: General- pt is awake,alert, oriented to time place and person Resp- No acute REsp distress, decreased at bases CVS- S1S2 regular inj rate and rhythm GIT- BS+, drain in situ, morbidly obese EXT-1+ LE Edema, no cyanosis CNS- CN 2-12 grossly intact. Moving all 4 extremities Psych- normal mood and  affect Skin warm and dry   Lab Results: CBC Recent Labs    07/01/20 0625 07/02/20 0616  WBC 9.8 8.9  HGB 9.9* 9.6*  HCT 27.3* 27.3*  PLT 83* 86*    BMET Recent Labs    07/01/20 0625 07/02/20 0616  NA 133* 135  K 3.9 3.6  CL 102 106  CO2 16* 17*  GLUCOSE 165* 130*  BUN 41* 47*  CREATININE 3.11* 3.22*  CALCIUM 7.0* 7.4*    Anion gap 135-123=12  Albumin 2.8  Delta  Anion  gap  12-9=3  Delta bicarb 24-17=7   MICRO Recent Results (from the past 240 hour(s))  Blood culture (routine single)     Status: Abnormal   Collection Time: 06/27/20  7:51 PM   Specimen: BLOOD  Result Value Ref Range Status   Specimen Description   Final    BLOOD LEFT ANTECUBITAL Performed at [facility], [address] [place], [place] [address]    Special Requests   Final    BOTTLES DRAWN AEROBIC AND ANAEROBIC Blood Culture adequate volume Performed at [facility], [address] [place], [place] [address]    Culture  Setup Time   Final    Organism ID to follow [place] IN BOTH AEROBIC AND ANAEROBIC BOTTLES CRITICAL RESULT CALLED TO, READ BACK BY AND VERIFIED WITHLloyd Huger AT [id number] 06/28/20 [place] Performed at [facility], [address] [place], [place] [address]    Culture ESCHERICHIA COLI (A)  Final   Report Status 06/30/2020 FINAL  Final   Organism ID, Bacteria ESCHERICHIA COLI  Final      Susceptibility   Escherichia coli - MIC*    AMPICILLIN >=32 RESISTANT Resistant     CEFAZOLIN <=4 SENSITIVE Sensitive     CEFEPIME <=0.12 SENSITIVE Sensitive     CEFTAZIDIME <=1 SENSITIVE Sensitive     CEFTRIAXONE <=0.25 SENSITIVE Sensitive     CIPROFLOXACIN <=0.25 SENSITIVE Sensitive     GENTAMICIN <=1 SENSITIVE Sensitive     IMIPENEM <=0.25 SENSITIVE Sensitive     TRIMETH/SULFA <=20 SENSITIVE Sensitive     AMPICILLIN/SULBACTAM >=32 RESISTANT Resistant     PIP/TAZO <=4 SENSITIVE Sensitive     * ESCHERICHIA COLI  Respiratory Panel by RT PCR (Flu A&B, Covid) - Nasopharyngeal Swab     Status: None   Collection Time: 06/27/20  7:51 PM   Specimen: Nasopharyngeal Swab  Result Value Ref Range Status   SARS Coronavirus 2 by RT PCR NEGATIVE NEGATIVE Final    Comment: (NOTE) SARS-CoV-2 target nucleic acids are NOT DETECTED.  The SARS-CoV-2 RNA is generally detectable in upper respiratoy specimens during the acute phase of infection.  The lowest concentration of SARS-CoV-2 viral copies this assay can detect is 131 copies/mL. A negative result does not preclude SARS-Cov-2 infection and should not be used as the sole basis for treatment or other patient management decisions. A negative result may occur with  improper specimen collection/handling, submission of specimen other than nasopharyngeal swab, presence of viral mutation(s) within the areas targeted by this assay, and inadequate number of viral copies (<131 copies/mL). A negative result must be combined with clinical observations, patient history, and epidemiological information. The expected result is Negative.  Fact Sheet for Patients:  PinkCheek.be  Fact Sheet for Healthcare Providers:  GravelBags.it  This test is no t yet approved or cleared by the [place] FDA and  has been authorized for detection and/or diagnosis of SARS-CoV-2 by FDA under an Emergency  Use Authorization (EUA). This EUA will remain  in effect (meaning this test can be used) for the duration of the COVID-19 declaration under Section 564(b)(1) of the Act, 21 U.S.C. section 360bbb-3(b)(1), unless the authorization is terminated or revoked sooner.     Influenza A by PCR NEGATIVE NEGATIVE Final   Influenza B by PCR NEGATIVE NEGATIVE Final    Comment: (NOTE) The Xpert Xpress SARS-CoV-2/FLU/RSV assay is intended as an aid in  the diagnosis of influenza from Nasopharyngeal swab specimens and  should not be used as a sole basis for treatment. Nasal washings and  aspirates are unacceptable for Xpert Xpress SARS-CoV-2/FLU/RSV  testing.  Fact Sheet for Patients: PinkCheek.be  Fact Sheet for Healthcare Providers: GravelBags.it  This test is not yet approved or cleared by the [place] FDA and  has been authorized for detection and/or diagnosis of SARS-CoV-2 by  FDA  under an Emergency Use Authorization (EUA). This EUA will remain  in effect (meaning this test can be used) for the duration of the  Covid-19 declaration under Section 564(b)(1) of the Act, 21  U.S.C. section 360bbb-3(b)(1), unless the authorization is  terminated or revoked. Performed at [facility], [address] [place], [place] [address]   Blood Culture ID Panel (Reflexed)     Status: Abnormal   Collection Time: 06/27/20  7:51 PM  Result Value Ref Range Status   Enterococcus faecalis NOT DETECTED NOT DETECTED Final   Enterococcus Faecium NOT DETECTED NOT DETECTED Final   Listeria monocytogenes NOT DETECTED NOT DETECTED Final   Staphylococcus species NOT DETECTED NOT DETECTED Final   Staphylococcus aureus (BCID) NOT DETECTED NOT DETECTED Final   Staphylococcus epidermidis NOT DETECTED NOT DETECTED Final   Staphylococcus lugdunensis NOT DETECTED NOT DETECTED Final   Streptococcus species NOT DETECTED NOT DETECTED Final   Streptococcus agalactiae NOT DETECTED NOT DETECTED Final   Streptococcus pneumoniae NOT DETECTED NOT DETECTED Final   Streptococcus pyogenes NOT DETECTED NOT DETECTED Final   A.calcoaceticus-baumannii NOT DETECTED NOT DETECTED Final   Bacteroides fragilis NOT DETECTED NOT DETECTED Final   Enterobacterales DETECTED (A) NOT DETECTED Final    Comment: Enterobacterales represent a large order of gram negative bacteria, not a single organism. CRITICAL RESULT CALLED TO, READ BACK BY AND VERIFIED WITH:  NATHAN BELUE AT 2330 06/28/20 SDR    Enterobacter cloacae complex NOT DETECTED NOT DETECTED Final   Escherichia coli DETECTED (A) NOT DETECTED Final    Comment: CRITICAL RESULT CALLED TO, READ BACK BY AND VERIFIED WITH:  NATHAN BELUE AT 0762 06/28/20 SDR    Klebsiella aerogenes NOT DETECTED NOT DETECTED Final   Klebsiella oxytoca NOT DETECTED NOT DETECTED Final   Klebsiella pneumoniae NOT DETECTED NOT DETECTED Final   Proteus species NOT DETECTED NOT  DETECTED Final   Salmonella species NOT DETECTED NOT DETECTED Final   Serratia marcescens NOT DETECTED NOT DETECTED Final   Haemophilus influenzae NOT DETECTED NOT DETECTED Final   Neisseria meningitidis NOT DETECTED NOT DETECTED Final   Pseudomonas aeruginosa NOT DETECTED NOT DETECTED Final   Stenotrophomonas maltophilia NOT DETECTED NOT DETECTED Final   Candida albicans NOT DETECTED NOT DETECTED Final   Candida auris NOT DETECTED NOT DETECTED Final   Candida glabrata NOT DETECTED NOT DETECTED Final   Candida krusei NOT DETECTED NOT DETECTED Final   Candida parapsilosis NOT DETECTED NOT DETECTED Final   Candida tropicalis NOT DETECTED NOT DETECTED Final   Cryptococcus neoformans/gattii NOT DETECTED NOT DETECTED Final   CTX-M ESBL NOT DETECTED NOT  DETECTED Final   Carbapenem resistance IMP NOT DETECTED NOT DETECTED Final   Carbapenem resistance KPC NOT DETECTED NOT DETECTED Final   Carbapenem resistance NDM NOT DETECTED NOT DETECTED Final   Carbapenem resist OXA 48 LIKE NOT DETECTED NOT DETECTED Final   Carbapenem resistance VIM NOT DETECTED NOT DETECTED Final    Comment: Performed at [facility], [address] [place], [place] [address]  MRSA PCR Screening     Status: None   Collection Time: 06/28/20  5:21 AM   Specimen: Nasopharyngeal  Result Value Ref Range Status   MRSA by PCR NEGATIVE NEGATIVE Final    Comment:        The GeneXpert MRSA Assay (FDA approved for NASAL specimens only), is one component of a comprehensive MRSA colonization surveillance program. It is not intended to diagnose MRSA infection nor to guide or monitor treatment for MRSA infections. Performed at [facility], [address] [place], [place] [address]   Aerobic/Anaerobic Culture (surgical/deep wound)     Status: None (Preliminary result)   Collection Time: 06/28/20 11:02 AM   Specimen: Fluid; Abscess  Result Value Ref Range Status   Specimen Description   Final     FLUID Performed at [facility], [address], [place], [place] [address]    Special Requests   Final    gallbladder fluid Performed at [facility], [place], [place] [address]    Gram Stain   Final    FEW WBC PRESENT,BOTH PMN AND MONONUCLEAR MODERATE GRAM POSITIVE RODS Performed at [facility], [place] [address] [place], [place] [address]    Culture   Final    FEW ESCHERICHIA COLI RARE KLEBSIELLA OXYTOCA NO ANAEROBES ISOLATED; CULTURE IN PROGRESS FOR 5 DAYS    Report Status PENDING  Incomplete   Organism ID, Bacteria ESCHERICHIA COLI  Final   Organism ID, Bacteria KLEBSIELLA OXYTOCA  Final      Susceptibility   Escherichia coli - MIC*    AMPICILLIN >=32 RESISTANT Resistant     CEFAZOLIN <=4 SENSITIVE Sensitive     CEFEPIME <=0.12 SENSITIVE Sensitive     CEFTAZIDIME <=1 SENSITIVE Sensitive     CEFTRIAXONE <=0.25 SENSITIVE Sensitive     CIPROFLOXACIN <=0.25 SENSITIVE Sensitive     GENTAMICIN <=1 SENSITIVE Sensitive     IMIPENEM <=0.25 SENSITIVE Sensitive     TRIMETH/SULFA <=20 SENSITIVE Sensitive     AMPICILLIN/SULBACTAM 16 INTERMEDIATE Intermediate     PIP/TAZO <=4 SENSITIVE Sensitive     * FEW ESCHERICHIA COLI   Klebsiella oxytoca - MIC*    AMPICILLIN >=32 RESISTANT Resistant     CEFAZOLIN <=4 SENSITIVE Sensitive     CEFEPIME <=0.12 SENSITIVE Sensitive     CEFTAZIDIME <=1 SENSITIVE Sensitive     CEFTRIAXONE <=0.25 SENSITIVE Sensitive     CIPROFLOXACIN <=0.25 SENSITIVE Sensitive     GENTAMICIN <=1 SENSITIVE Sensitive     IMIPENEM <=0.25 SENSITIVE Sensitive     TRIMETH/SULFA <=20 SENSITIVE Sensitive     AMPICILLIN/SULBACTAM 8 SENSITIVE Sensitive     PIP/TAZO <=4 SENSITIVE Sensitive     * RARE KLEBSIELLA OXYTOCA      Lab Results  Component Value Date   CALCIUM 7.4 (L) 07/02/2020   PHOS 6.6 (H) 06/30/2020   CT abdomen with contrast done on October 19 No renal calculi or obstructive changes are seen. Delayed  images demonstrate no significant excretion although this is expected given the patient's poor renal function. Bladder is  partially distended.  Impression:   Patient is a 59 year old African-American female with past medical history of hypertension who was admitted with a chief complaint of septic shock   1)Renal  Acute kidney injury Patient has ATN Patient has ATN secondary to septic shock with superimposed IV contrast Patient is currently oliguric/no oliguric Pt refuses catheter, not sure of the output data Will ask for post void residual   Patient will benefit from CRRT but pt refusing it   2) hypotension Patient required vasopressor support Now stable  3)Anemia of critical illness  HGb at goal (9--11)  4) septic shock Secondary to E. coli Patient is on broad-spectrum antibiotics  5) secondary hyperparathyroidism Secondary hyperparathyroidism-present Patient phosphorus on the higher side We will follow   6) electrolytes   normokalemic   Hyponatremia Now better    7)Acid base Co2 is not at goal  Patient has both high anion gap with acidosis and non-anion gap acidosis Patient has lactic acidosis and nongap acidosis   Plan:   Will change IVF to D5 with bicarb to help with normal anion gap acidosis.  Will ask for post void residual.   We will ask for fractional excretion of sodium  I did educate patient at length about her kidney related issues and possible need for renal replacement therapy. Educated patient about need to document her urine output more clearly.  Patient does not wish to have catheter or have renal replacement therapy/dialysis at this time.   We will continue to follow  Nikoleta Dady s [facility] 07/02/2020, 2:01 PM

## 2020-07-02 NOTE — Progress Notes (Signed)
*  PRELIMINARY RESULTS* Echocardiogram 2D Echocardiogram has been performed.  Sherrie Sport 07/02/2020, 8:17 AM

## 2020-07-02 NOTE — Progress Notes (Signed)
[facility] SURGICAL PROGRESS NOTE (cpt [phone])  Hospital Day(s): 5.   Interval History:  Patient seen and examined No acute events overnight Off of pressors this morning Leukocytosis has resolved   Renal function about the same, but UOP improved  Percutaneous cholecystostomy tube with 190 ccs out in last 24 hours, bilious Cx from perc chole placement growing E. Coli and Klebsiella She is on CLD, tolerating well She states she feels better from a fluid overload perspective.   Review of Systems:  Constitutional: denies fever, chills  HEENT: denies cough or congestion  Respiratory: denies any shortness of breath  Cardiovascular: denies chest pain or palpitations  Gastrointestinal: denies abdominal pain, N/V, or diarrhea/and bowel function as per interval history Genitourinary: denies burning with urination or urinary frequency  Vital signs in last 24 hours: [min-max] current  Temp:  [97.5 F (36.4 C)-98 F (36.7 C)] 98 F (36.7 C) (10/24 0800) Pulse Rate:  [33-100] 78 (10/24 0900) Resp:  [12-25] 12 (10/24 0900) BP: (80-128)/(44-101) 111/87 (10/24 0900) SpO2:  [88 %-100 %] 97 % (10/24 0900)     Height: 5\' 5"  (165.1 cm) Weight: 97.6 kg BMI (Calculated): 35.81   Intake/Output last 2 shifts:  10/23 0701 - 10/24 0700 In: 2283.2 [I.V.:2083.2; IV Piggyback:200] Out: 4008 [Urine:825; Drains:190]   Physical Exam:  Constitutional: alert, cooperative and no distress  HENT: normocephalic without obvious abnormality  Eyes: PERRL, EOM's grossly intact and symmetric  Respiratory: breathing non-labored at rest  Cardiovascular: regular rate and sinus rhythm  Gastrointestinal: Soft, no appreciable tenderness, non-distended, no rebound/guarding. Percutaneous cholecystostomy tube in RUQ with bilious output    Labs:  CBC Latest Ref Rng & Units 07/02/2020 07/01/2020 06/30/2020  WBC 4.0 - 10.5 K/uL 8.9 9.8 15.4(H)  Hemoglobin 12.0 - 15.0 g/dL 9.6(L) 9.9(L) 9.4(L)   Hematocrit 36 - 46 % 27.3(L) 27.3(L) 27.6(L)  Platelets 150 - 400 K/uL 86(L) 83(L) 88(L)   CMP Latest Ref Rng & Units 07/02/2020 07/01/2020 06/30/2020  Glucose 70 - 99 mg/dL 130(H) 165(H) 139(H)  BUN 8 - 23 mg/dL 47(H) 41(H) 36(H)  Creatinine 0.44 - 1.00 mg/dL 3.22(H) 3.11(H) 2.75(H)  Sodium 135 - 145 mmol/L 135 133(L) 135  Potassium 3.5 - 5.1 mmol/L 3.6 3.9 3.6  Chloride 98 - 111 mmol/L 106 102 104  CO2 22 - 32 mmol/L 17(L) 16(L) 15(L)  Calcium 8.9 - 10.3 mg/dL 7.4(L) 7.0(L) 6.6(L)  Total Protein 6.5 - 8.1 g/dL - 6.2(L) 5.9(L)  Total Bilirubin 0.3 - 1.2 mg/dL - 2.3(H) 2.8(H)  Alkaline Phos 38 - 126 U/L - 198(H) 119  AST 15 - 41 U/L - 201(H) 394(H)  ALT 0 - 44 U/L - 172(H) 215(H)     Imaging studies: No new pertinent imaging studies   Assessment/Plan: (ICD-10's: K81.0) 59 y.o. female with septic shock and MSOF due to acute cholecystitis. Off pressors and with improved UOP, although creatinine remains elevated. She is s/p percutaneous cholecystostomy tube placement on 10/20, growing E coli and Klebsiella from bile cultures.   - Appreciate PCCM assistance with this patient  - Okay to advance diet as tolerated  - Continue IVF resuscitation  - Continue IV ABx (Zosyn); follow up sensitivities  - Monitor leukocytosis; lactic acidosis; renal function  - Continue percutaneous cholecystostomy tube; monitor and record output  - No emergent intervention; she understands that should she deteriorate or fail conservative measures she would require cholecystectomy which would possibly require open procedure   - further management per primary; we will follow

## 2020-07-02 NOTE — Progress Notes (Signed)
Bladder scan performed pre-void and post-void.  Pre-void: 170ml Post-void: 1ml

## 2020-07-02 NOTE — Progress Notes (Signed)
Follow up - Critical Care Medicine Note  Patient Details:    Carrie Hardy is an 59 y.o. female presented to [facility] on 10/19 with severe sepsis due to cholecystitis requiring vasopressors.  Admitted to ICU for further management.  Lines, Airways, Drains: CVC Triple Lumen 06/28/20 Right Femoral (Active)  Indication for Insertion or Continuance of Line Vasoactive infusions 07/01/20 0800  Site Assessment Clean;Dry;Intact 07/01/20 0800  Proximal Lumen Status Infusing 07/01/20 0800  Medial Lumen Status Infusing 07/01/20 0800  Distal Lumen Status Infusing 07/01/20 0800  Dressing Type Transparent;Occlusive 07/01/20 0800  Dressing Status Clean;Dry;Intact 07/01/20 0800  Antimicrobial disc in place? Yes 07/01/20 0800  Line Care Connections checked and tightened 07/01/20 0800  Dressing Intervention Dressing changed 06/30/20 0400  Dressing Change Due 07/07/20 07/01/20 0800     Biliary Tube RLQ (Active)  Site Description Unremarkable 07/01/20 1200  Dressing Status Clean;Dry;Intact 07/01/20 1200  Drainage Color Brown 07/01/20 1200  Tube Status Open to gravity drainage 07/01/20 1200  Output (mL) 90 mL 07/01/20 0630     External Urinary Catheter (Active)  Collection Container Dedicated Suction Canister 07/01/20 1200  Securement Method Securing device (Describe) 07/01/20 1200  Site Assessment Clean;Intact 07/01/20 1200  Intervention Equipment Changed 06/30/20 2015  Output (mL) 50 mL 07/01/20 0700    Anti-infectives:  Anti-infectives (From admission, onward)   Start     Dose/Rate Route Frequency Ordered Stop   06/30/20 1700  cefTRIAXone (ROCEPHIN) 2 g in sodium chloride 0.9 % 100 mL IVPB        2 g 200 mL/hr over 30 Minutes Intravenous Every 24 hours 06/30/20 1552     06/27/20 2315  piperacillin-tazobactam (ZOSYN) IVPB 3.375 g  Status:  Discontinued        3.375 g 12.5 mL/hr over 240 Minutes Intravenous Every 8 hours 06/27/20 2312 06/30/20 1552   06/27/20 2100  metroNIDAZOLE (FLAGYL) IVPB  500 mg        500 mg 100 mL/hr over 60 Minutes Intravenous  Once 06/27/20 2054 06/27/20 2225   06/27/20 1930  cefTRIAXone (ROCEPHIN) 2 g in sodium chloride 0.9 % 100 mL IVPB        2 g 200 mL/hr over 30 Minutes Intravenous  Once 06/27/20 1926 06/27/20 2053     Scheduled Meds: . Chlorhexidine Gluconate Cloth  6 each Topical Q0600  . hydrocortisone sod succinate (SOLU-CORTEF) inj  50 mg Intravenous Q6H  . pantoprazole (PROTONIX) IV  40 mg Intravenous QHS  . sodium chloride flush  5 mL Intracatheter Q8H   Continuous Infusions: . sodium chloride 10 mL/hr at 07/02/20 0700  . cefTRIAXone (ROCEPHIN)  IV Stopped (07/01/20 1814)  . dextrose 5 % and 0.9% NaCl 50 mL/hr at 07/02/20 0700  . norepinephrine (LEVOPHED) Adult infusion 3 mcg/min (07/01/20 0700)  . vasopressin 0.04 Units/min (07/01/20 1249)   PRN Meds:.docusate sodium, loperamide, morphine injection, ondansetron (ZOFRAN) IV, polyethylene glycol   Microbiology: Results for orders placed or performed during the hospital encounter of 06/27/20  Blood culture (routine single)     Status: Abnormal   Collection Time: 06/27/20  7:51 PM   Specimen: BLOOD  Result Value Ref Range Status   Specimen Description   Final    BLOOD LEFT ANTECUBITAL Performed at [facility], [address] [place], [place] [address]    Special Requests   Final    BOTTLES DRAWN AEROBIC AND ANAEROBIC Blood Culture adequate volume Performed at [facility], [address] [place], [place] [address]  Culture  Setup Time   Final    Organism ID to follow GRAM NEGATIVE RODS IN BOTH AEROBIC AND ANAEROBIC BOTTLES CRITICAL RESULT CALLED TO, READ BACK BY AND VERIFIED WITHLloyd Huger AT [id number] 06/28/20 [place] Performed at [facility], [address] [place], [place] [address]    Culture ESCHERICHIA COLI (A)  Final   Report Status 06/30/2020 FINAL  Final   Organism ID, Bacteria ESCHERICHIA COLI  Final      Susceptibility    Escherichia coli - MIC*    AMPICILLIN >=32 RESISTANT Resistant     CEFAZOLIN <=4 SENSITIVE Sensitive     CEFEPIME <=0.12 SENSITIVE Sensitive     CEFTAZIDIME <=1 SENSITIVE Sensitive     CEFTRIAXONE <=0.25 SENSITIVE Sensitive     CIPROFLOXACIN <=0.25 SENSITIVE Sensitive     GENTAMICIN <=1 SENSITIVE Sensitive     IMIPENEM <=0.25 SENSITIVE Sensitive     TRIMETH/SULFA <=20 SENSITIVE Sensitive     AMPICILLIN/SULBACTAM >=32 RESISTANT Resistant     PIP/TAZO <=4 SENSITIVE Sensitive     * ESCHERICHIA COLI  Respiratory Panel by RT PCR (Flu A&B, Covid) - Nasopharyngeal Swab     Status: None   Collection Time: 06/27/20  7:51 PM   Specimen: Nasopharyngeal Swab  Result Value Ref Range Status   SARS Coronavirus 2 by RT PCR NEGATIVE NEGATIVE Final    Comment: (NOTE) SARS-CoV-2 target nucleic acids are NOT DETECTED.  The SARS-CoV-2 RNA is generally detectable in upper respiratoy specimens during the acute phase of infection. The lowest concentration of SARS-CoV-2 viral copies this assay can detect is 131 copies/mL. A negative result does not preclude SARS-Cov-2 infection and should not be used as the sole basis for treatment or other patient management decisions. A negative result may occur with  improper specimen collection/handling, submission of specimen other than nasopharyngeal swab, presence of viral mutation(s) within the areas targeted by this assay, and inadequate number of viral copies (<131 copies/mL). A negative result must be combined with clinical observations, patient history, and epidemiological information. The expected result is Negative.  Fact Sheet for Patients:  PinkCheek.be  Fact Sheet for Healthcare Providers:  GravelBags.it  This test is no t yet approved or cleared by the [place] FDA and  has been authorized for detection and/or diagnosis of SARS-CoV-2 by FDA under an Emergency Use Authorization (EUA).  This EUA will remain  in effect (meaning this test can be used) for the duration of the COVID-19 declaration under Section 564(b)(1) of the Act, 21 U.S.C. section 360bbb-3(b)(1), unless the authorization is terminated or revoked sooner.     Influenza A by PCR NEGATIVE NEGATIVE Final   Influenza B by PCR NEGATIVE NEGATIVE Final    Comment: (NOTE) The Xpert Xpress SARS-CoV-2/FLU/RSV assay is intended as an aid in  the diagnosis of influenza from Nasopharyngeal swab specimens and  should not be used as a sole basis for treatment. Nasal washings and  aspirates are unacceptable for Xpert Xpress SARS-CoV-2/FLU/RSV  testing.  Fact Sheet for Patients: PinkCheek.be  Fact Sheet for Healthcare Providers: GravelBags.it  This test is not yet approved or cleared by the [place] FDA and  has been authorized for detection and/or diagnosis of SARS-CoV-2 by  FDA under an Emergency Use Authorization (EUA). This EUA will remain  in effect (meaning this test can be used) for the duration of the  Covid-19 declaration under Section 564(b)(1) of the Act, 21  U.S.C. section 360bbb-3(b)(1), unless the authorization is  terminated or revoked. Performed  at [facility], [address] [place], [place] [address]   Blood Culture ID Panel (Reflexed)     Status: Abnormal   Collection Time: 06/27/20  7:51 PM  Result Value Ref Range Status   Enterococcus faecalis NOT DETECTED NOT DETECTED Final   Enterococcus Faecium NOT DETECTED NOT DETECTED Final   Listeria monocytogenes NOT DETECTED NOT DETECTED Final   Staphylococcus species NOT DETECTED NOT DETECTED Final   Staphylococcus aureus (BCID) NOT DETECTED NOT DETECTED Final   Staphylococcus epidermidis NOT DETECTED NOT DETECTED Final   Staphylococcus lugdunensis NOT DETECTED NOT DETECTED Final   Streptococcus species NOT DETECTED NOT DETECTED Final   Streptococcus agalactiae NOT DETECTED  NOT DETECTED Final   Streptococcus pneumoniae NOT DETECTED NOT DETECTED Final   Streptococcus pyogenes NOT DETECTED NOT DETECTED Final   A.calcoaceticus-baumannii NOT DETECTED NOT DETECTED Final   Bacteroides fragilis NOT DETECTED NOT DETECTED Final   Enterobacterales DETECTED (A) NOT DETECTED Final    Comment: Enterobacterales represent a large order of gram negative bacteria, not a single organism. CRITICAL RESULT CALLED TO, READ BACK BY AND VERIFIED WITH:  NATHAN BELUE AT 0630 06/28/20 SDR    Enterobacter cloacae complex NOT DETECTED NOT DETECTED Final   Escherichia coli DETECTED (A) NOT DETECTED Final    Comment: CRITICAL RESULT CALLED TO, READ BACK BY AND VERIFIED WITH:  NATHAN BELUE AT 1601 06/28/20 SDR    Klebsiella aerogenes NOT DETECTED NOT DETECTED Final   Klebsiella oxytoca NOT DETECTED NOT DETECTED Final   Klebsiella pneumoniae NOT DETECTED NOT DETECTED Final   Proteus species NOT DETECTED NOT DETECTED Final   Salmonella species NOT DETECTED NOT DETECTED Final   Serratia marcescens NOT DETECTED NOT DETECTED Final   Haemophilus influenzae NOT DETECTED NOT DETECTED Final   Neisseria meningitidis NOT DETECTED NOT DETECTED Final   Pseudomonas aeruginosa NOT DETECTED NOT DETECTED Final   Stenotrophomonas maltophilia NOT DETECTED NOT DETECTED Final   Candida albicans NOT DETECTED NOT DETECTED Final   Candida auris NOT DETECTED NOT DETECTED Final   Candida glabrata NOT DETECTED NOT DETECTED Final   Candida krusei NOT DETECTED NOT DETECTED Final   Candida parapsilosis NOT DETECTED NOT DETECTED Final   Candida tropicalis NOT DETECTED NOT DETECTED Final   Cryptococcus neoformans/gattii NOT DETECTED NOT DETECTED Final   CTX-M ESBL NOT DETECTED NOT DETECTED Final   Carbapenem resistance IMP NOT DETECTED NOT DETECTED Final   Carbapenem resistance KPC NOT DETECTED NOT DETECTED Final   Carbapenem resistance NDM NOT DETECTED NOT DETECTED Final   Carbapenem resist OXA 48 LIKE NOT  DETECTED NOT DETECTED Final   Carbapenem resistance VIM NOT DETECTED NOT DETECTED Final    Comment: Performed at [facility], [address] [place], [place] [address]  MRSA PCR Screening     Status: None   Collection Time: 06/28/20  5:21 AM   Specimen: Nasopharyngeal  Result Value Ref Range Status   MRSA by PCR NEGATIVE NEGATIVE Final    Comment:        The GeneXpert MRSA Assay (FDA approved for NASAL specimens only), is one component of a comprehensive MRSA colonization surveillance program. It is not intended to diagnose MRSA infection nor to guide or monitor treatment for MRSA infections. Performed at [facility], [address] [place],  [address]   Aerobic/Anaerobic Culture (surgical/deep wound)     Status: None (Preliminary result)   Collection Time: 06/28/20 11:02 AM   Specimen: Fluid; Abscess  Result Value Ref Range Status  Specimen Description   Final    FLUID Performed at [facility], [address] [place], [place] [address]    Special Requests   Final    gallbladder fluid Performed at [facility], [address] [place], [place] [address]    Gram Stain   Final    FEW WBC PRESENT,BOTH PMN AND MONONUCLEAR MODERATE GRAM POSITIVE RODS Performed at [facility], [place] [address], [place], [place] [address]    Culture   Final    FEW ESCHERICHIA COLI RARE KLEBSIELLA OXYTOCA NO ANAEROBES ISOLATED; CULTURE IN PROGRESS FOR 5 DAYS    Report Status PENDING  Incomplete   Organism ID, Bacteria ESCHERICHIA COLI  Final   Organism ID, Bacteria KLEBSIELLA OXYTOCA  Final      Susceptibility   Escherichia coli - MIC*    AMPICILLIN >=32 RESISTANT Resistant     CEFAZOLIN <=4 SENSITIVE Sensitive     CEFEPIME <=0.12 SENSITIVE Sensitive     CEFTAZIDIME <=1 SENSITIVE Sensitive     CEFTRIAXONE <=0.25 SENSITIVE Sensitive     CIPROFLOXACIN <=0.25 SENSITIVE Sensitive     GENTAMICIN <=1 SENSITIVE Sensitive     IMIPENEM  <=0.25 SENSITIVE Sensitive     TRIMETH/SULFA <=20 SENSITIVE Sensitive     AMPICILLIN/SULBACTAM 16 INTERMEDIATE Intermediate     PIP/TAZO <=4 SENSITIVE Sensitive     * FEW ESCHERICHIA COLI   Klebsiella oxytoca - MIC*    AMPICILLIN >=32 RESISTANT Resistant     CEFAZOLIN <=4 SENSITIVE Sensitive     CEFEPIME <=0.12 SENSITIVE Sensitive     CEFTAZIDIME <=1 SENSITIVE Sensitive     CEFTRIAXONE <=0.25 SENSITIVE Sensitive     CIPROFLOXACIN <=0.25 SENSITIVE Sensitive     GENTAMICIN <=1 SENSITIVE Sensitive     IMIPENEM <=0.25 SENSITIVE Sensitive     TRIMETH/SULFA <=20 SENSITIVE Sensitive     AMPICILLIN/SULBACTAM 8 SENSITIVE Sensitive     PIP/TAZO <=4 SENSITIVE Sensitive     * RARE KLEBSIELLA OXYTOCA   Results for orders placed or performed during the hospital encounter of 06/27/20 (from the past 24 hour(s))  Glucose, capillary     Status: Abnormal   Collection Time: 07/01/20 11:14 AM  Result Value Ref Range   Glucose-Capillary 150 (H) 70 - 99 mg/dL  Glucose, capillary     Status: Abnormal   Collection Time: 07/01/20  4:15 PM  Result Value Ref Range   Glucose-Capillary 142 (H) 70 - 99 mg/dL  Glucose, capillary     Status: Abnormal   Collection Time: 07/01/20  7:23 PM  Result Value Ref Range   Glucose-Capillary 112 (H) 70 - 99 mg/dL  Glucose, capillary     Status: Abnormal   Collection Time: 07/01/20 11:26 PM  Result Value Ref Range   Glucose-Capillary 122 (H) 70 - 99 mg/dL  CBC with Differential/Platelet     Status: Abnormal   Collection Time: 07/02/20  6:16 AM  Result Value Ref Range   WBC 8.9 4.0 - 10.5 K/uL   RBC 2.67 (L) 3.87 - 5.11 MIL/uL   Hemoglobin 9.6 (L) 12.0 - 15.0 g/dL   HCT 27.3 (L) 36 - 46 %   MCV 102.2 (H) 80.0 - 100.0 fL   MCH 36.0 (H) 26.0 - 34.0 pg   MCHC 35.2 30.0 - 36.0 g/dL   RDW 15.3 11.5 - 15.5 %   Platelets 86 (L) 150 - 400 K/uL   nRBC 1.2 (H) 0.0 - 0.2 %   Neutrophils Relative % 81 %  Neutro Abs 7.2 1.7 - 7.7 K/uL   Lymphocytes Relative 6 %   Lymphs  Abs 0.6 (L) 0.7 - 4.0 K/uL   Monocytes Relative 9 %   Monocytes Absolute 0.8 0.1 - 1.0 K/uL   Eosinophils Relative 0 %   Eosinophils Absolute 0.0 0.0 - 0.5 K/uL   Basophils Relative 0 %   Basophils Absolute 0.0 0.0 - 0.1 K/uL   WBC Morphology MORPHOLOGY UNREMARKABLE    RBC Morphology MORPHOLOGY UNREMARKABLE    Smear Review Normal platelet morphology    Immature Granulocytes 4 %   Abs Immature Granulocytes 0.31 (H) 0.00 - 0.07 K/uL  Basic metabolic panel     Status: Abnormal   Collection Time: 07/02/20  6:16 AM  Result Value Ref Range   Sodium 135 135 - 145 mmol/L   Potassium 3.6 3.5 - 5.1 mmol/L   Chloride 106 98 - 111 mmol/L   CO2 17 (L) 22 - 32 mmol/L   Glucose, Bld 130 (H) 70 - 99 mg/dL   BUN 47 (H) 8 - 23 mg/dL   Creatinine, Ser 3.22 (H) 0.44 - 1.00 mg/dL   Calcium 7.4 (L) 8.9 - 10.3 mg/dL   GFR, Estimated 16 (L) >60 mL/min   Anion gap 12 5 - 15  Magnesium     Status: None   Collection Time: 07/02/20  6:16 AM  Result Value Ref Range   Magnesium 2.0 1.7 - 2.4 mg/dL  Glucose, capillary     Status: Abnormal   Collection Time: 07/02/20  7:31 AM  Result Value Ref Range   Glucose-Capillary 119 (H) 70 - 99 mg/dL     Best Practice/Protocols:  VTE Prophylaxis: Mechanical GI Prophylaxis: Proton Pump Inhibitor Hyperglycemia (ICU) Heparin SQ held due to  thrombocytopenia   Events: 10/19 Admit to ICU 10/20 GB drain placed on pressors, E COLI bacteramia 10/22 on pressors, weaning down 10/23 renal function worsening 10/24 persistent worsening of renal function, nephrology consult obtained   Studies: CT HEAD WO CONTRAST  Result Date: 06/27/2020 CLINICAL DATA:  Lightheaded. EXAM: CT HEAD WITHOUT CONTRAST TECHNIQUE: Contiguous axial images were obtained from the base of the skull through the vertex without intravenous contrast. COMPARISON:  None. FINDINGS: Brain: There is mild cerebral atrophy with widening of the extra-axial spaces and ventricular dilatation. There are areas  of decreased attenuation within the white matter tracts of the supratentorial brain, consistent with microvascular disease changes. Vascular: No hyperdense vessel or unexpected calcification. Skull: Normal. Negative for fracture or focal lesion. Sinuses/Orbits: No acute finding. Other: None. IMPRESSION: No acute intracranial pathology. Electronically Signed   By: Virgina Norfolk M.D.   On: 06/27/2020 18:10   [place] Intraoperative  Result Date: 06/28/2020 INDICATION: Acute cholecystitis. Poor operative candidate. Please perform image guided cholecystostomy tube placement for infection source control purposes. EXAM: ULTRASOUND AND FLUOROSCOPIC-GUIDED CHOLECYSTOSTOMY TUBE PLACEMENT COMPARISON:  CT abdomen and pelvis-06/27/2020 MEDICATIONS: The patient is currently admitted to the hospital and on intravenous antibiotics. Antibiotics were administered within an appropriate time frame prior to skin puncture. ANESTHESIA/SEDATION: Moderate (conscious) sedation was employed during this procedure. A total of Versed 0.5 mg and Fentanyl 25 mcg was administered intravenously. Moderate Sedation Time: 15 minutes. The patient's level of consciousness and vital signs were monitored continuously by radiology nursing throughout the procedure under my direct supervision. CONTRAST:  None FLUOROSCOPY TIME:  Not provided COMPLICATIONS: None immediate. PROCEDURE: Informed written consent was obtained from the patient after a discussion of the risks, benefits and alternatives to treatment. Questions regarding the  procedure were encouraged and answered. A timeout was performed prior to the initiation of the procedure. Patient was placed supine on the CT gantry with CT imaging demonstrating distension of the gallbladder with rather extensive amount pericholecystic fluid and layering echogenic gallstones or biliary sludge. The table position was marked and the gallbladder was identified sonographically. The right upper abdominal quadrant  was prepped and draped in the usual sterile fashion, and a sterile drape was applied covering the operative field. Maximum barrier sterile technique with sterile gowns and gloves were used for the procedure. A timeout was performed prior to the initiation of the procedure. Local anesthesia was provided with 1% lidocaine with epinephrine. Ultrasound scanning of the right upper quadrant demonstrates a markedly dilated gallbladder with significant amount pericholecystic fluid. Of note, the patient reported pain with ultrasound imaging over the gallbladder. Utilizing a transhepatic approach, an 18 gauge needle was advanced into the gallbladder under direct ultrasound guidance. An ultrasound image was saved for documentation purposes. A short Amplatz wire was coiled within the gallbladder lumen. Appropriate positioning was confirmed with CT imaging. The track was dilated allowing placement of a 10 French percutaneous drainage catheter with end coiled and locked within the gallbladder lumen. At this point, approximately 80 cc of bile was aspirated from the gallbladder. A small amount of aspirated bile was capped sent to laboratory for analysis. The cholecystostomy tube was flushed with a small amount of saline and connected to a gravity bag. Obscured place within interrupted suture and a Stat Lock device. A dressing was applied. The patient tolerated the procedure well without immediate postprocedural complication. IMPRESSION: Successful ultrasound and CT guided placement of a 10.2 French cholecystostomy tube. A small amount of aspirated bile was sent to the laboratory for analysis. Electronically Signed   By: Sandi Mariscal M.D.   On: 06/28/2020 11:41   CT ABDOMEN PELVIS W CONTRAST  Result Date: 06/27/2020 CLINICAL DATA:  Elevated LFTs EXAM: CT ABDOMEN AND PELVIS WITH CONTRAST TECHNIQUE: Multidetector CT imaging of the abdomen and pelvis was performed using the standard protocol following bolus administration of  intravenous contrast. CONTRAST:  25mL OMNIPAQUE IOHEXOL 300 MG/ML  SOLN COMPARISON:  Ultrasound from earlier in the same day, CT from 03/27/2016. FINDINGS: Lower chest: No acute abnormality. Hepatobiliary: Fatty infiltration of the liver is noted. The gallbladder is well distended with multiple dependent gallstones. Mild pericholecystic inflammatory changes are noted similar to that seen on prior ultrasound. No biliary ductal dilatation is seen. Pancreas: Unremarkable. No pancreatic ductal dilatation or surrounding inflammatory changes. Spleen: Normal in size without focal abnormality. Adrenals/Urinary Tract: Adrenal glands are within normal limits. Kidneys demonstrate a normal enhancement pattern bilaterally. No renal calculi or obstructive changes are seen. Delayed images demonstrate no significant excretion although this is expected given the patient's poor renal function. Bladder is partially distended. Stomach/Bowel: No obstructive or inflammatory changes of the colon are seen. The appendix is within normal limits. Small bowel shows some postsurgical changes. No obstructive changes are noted. Postsurgical changes are noted in normal stomach as well consistent with prior bariatric surgery. Vascular/Lymphatic: Aortic atherosclerosis. No enlarged abdominal or pelvic lymph nodes. Reproductive: Uterus is well visualized and demonstrates a central hypodense mass lesion. This likely represents a degenerating fibroid given the more diffuse enhancement on prior CT examination. Other: No abdominal wall hernia or abnormality. No abdominopelvic ascites. Musculoskeletal: Degenerative changes of lumbar spine are noted. IMPRESSION: Dilated gallbladder with wall thickening and pericholecystic inflammatory change in cholelithiasis similar to that seen on prior ultrasound examination.  Again HIDA scan may be helpful to assess for gallbladder function/cystic duct patency. Hypodense lesion within the uterus which measures  approximately 3.3 cm. This likely represents a degenerating fibroid given the findings on prior CT. Chronic changes as described above. Electronically Signed   By: Inez Catalina M.D.   On: 06/27/2020 23:22   MR 3D Recon At Scanner  Result Date: 06/29/2020 CLINICAL DATA:  59 year old female with history of jaundice. Cholelithiasis. EXAM: MRI ABDOMEN WITHOUT AND WITH CONTRAST (INCLUDING MRCP) TECHNIQUE: Multiplanar multisequence MR imaging of the abdomen was performed both before and after the administration of intravenous contrast. Heavily T2-weighted images of the biliary and pancreatic ducts were obtained, and three-dimensional MRCP images were rendered by post processing. CONTRAST:  61mL GADAVIST GADOBUTROL 1 MMOL/ML IV SOLN COMPARISON:  No prior abdominal MRI. CT the abdomen and pelvis 06/27/2020. FINDINGS: Lower chest: Small right pleural effusion lying dependently with areas of probable passive atelectasis in the right lower lobe. Mild cardiomegaly. Hepatobiliary: In the periphery of segment 8 of the liver (axial image 25 of series 28) there is a 1.4 x 0.8 cm hypovascular region which is mildly T1 hypointense, mildly T2 hyperintense, and associated with small filling defects in the proximal aspect of the middle hepatic vein (axial image 21 of series 28). No other hepatic lesions are noted. No intra or extrahepatic biliary ductal dilatation noted on MRCP images. However, there is mild diffuse periportal edema. Interval placement of percutaneous cholecystostomy tube which resides within a decompressed gallbladder. Gallbladder wall appears mildly thickened but severely edematous. Common bile duct measures 3 mm in the porta hepatis. No filling defect in the common bile duct to suggest choledocholithiasis. Pancreas: No pancreatic mass. No pancreatic ductal dilatation. No pancreatic or peripancreatic fluid collections. Trace amount of peripancreatic fluid. Spleen:  Unremarkable. Adrenals/Urinary Tract:  Subcentimeter T1 hypointense, T2 hyperintense, nonenhancing lesions in both kidneys, compatible with simple cysts. Small amount of perinephric fluid bilaterally. No hydroureteronephrosis in the visualized portions of the abdomen. Bilateral adrenal glands are normal in appearance. Stomach/Bowel: Unremarkable. Vascular/Lymphatic: Aortic atherosclerosis, without evidence of aneurysm in the abdominal vasculature. No lymphadenopathy noted in the abdomen. Other: Small volume of ascites, most evident in the right upper quadrant, particularly adjacent to the gallbladder. Perinephric fluid bilaterally, as well as a small amount of peripancreatic fluid in the retroperitoneum. Musculoskeletal: No aggressive appearing osseous lesions are noted in the visualized portions of the skeleton. IMPRESSION: 1. Interval percutaneous cholecystostomy with decompression of the gallbladder. Previously noted gallstones are not confidently identified on today's examination. Gallbladder wall appears thickened and severely edematous with persistent pericholecystic fluid. 2. No choledocholithiasis or signs of biliary tract obstruction. 3. Periportal edema in the liver. 4. Edema in the retroperitoneum including both peripancreatic and perinephric fluid bilaterally. 5. Small right pleural effusion lying dependently with some associated passive subsegmental atelectasis in the right lower lobe. Electronically Signed   By: Vinnie Langton M.D.   On: 06/29/2020 06:15   DG Chest Port 1 View  Result Date: 06/28/2020 CLINICAL DATA:  Respiratory failure. EXAM: PORTABLE CHEST 1 VIEW COMPARISON:  Chest x-ray 06/27/2020. FINDINGS: Heart size stable. Low lung volumes. Very mild bilateral interstitial prominence noted. Mild pneumonitis can not be excluded. Persistent mild bibasilar subsegmental atelectasis. No pleural effusion or pneumothorax. IMPRESSION: Low lung volumes. Very mild bilateral interstitial prominence noted. Mild pneumonitis can not be  excluded. Persistent mild bibasilar subsegmental atelectasis. Electronically Signed   By: Marcello Moores  Register   On: 06/28/2020 07:56   DG Chest [facility]  Result Date: 06/27/2020 CLINICAL DATA:  Possible sepsis EXAM: PORTABLE CHEST 1 VIEW COMPARISON:  None. FINDINGS: Minimal atelectasis left base. No consolidation or effusion. Normal heart size. Aortic atherosclerosis. No pneumothorax. IMPRESSION: No active disease. Electronically Signed   By: Donavan Foil M.D.   On: 06/27/2020 19:00   MR ABDOMEN MRCP W WO CONTAST  Result Date: 06/29/2020 CLINICAL DATA:  59 year old female with history of jaundice. Cholelithiasis. EXAM: MRI ABDOMEN WITHOUT AND WITH CONTRAST (INCLUDING MRCP) TECHNIQUE: Multiplanar multisequence MR imaging of the abdomen was performed both before and after the administration of intravenous contrast. Heavily T2-weighted images of the biliary and pancreatic ducts were obtained, and three-dimensional MRCP images were rendered by post processing. CONTRAST:  83mL GADAVIST GADOBUTROL 1 MMOL/ML IV SOLN COMPARISON:  No prior abdominal MRI. CT the abdomen and pelvis 06/27/2020. FINDINGS: Lower chest: Small right pleural effusion lying dependently with areas of probable passive atelectasis in the right lower lobe. Mild cardiomegaly. Hepatobiliary: In the periphery of segment 8 of the liver (axial image 25 of series 28) there is a 1.4 x 0.8 cm hypovascular region which is mildly T1 hypointense, mildly T2 hyperintense, and associated with small filling defects in the proximal aspect of the middle hepatic vein (axial image 21 of series 28). No other hepatic lesions are noted. No intra or extrahepatic biliary ductal dilatation noted on MRCP images. However, there is mild diffuse periportal edema. Interval placement of percutaneous cholecystostomy tube which resides within a decompressed gallbladder. Gallbladder wall appears mildly thickened but severely edematous. Common bile duct measures 3 mm in the  porta hepatis. No filling defect in the common bile duct to suggest choledocholithiasis. Pancreas: No pancreatic mass. No pancreatic ductal dilatation. No pancreatic or peripancreatic fluid collections. Trace amount of peripancreatic fluid. Spleen:  Unremarkable. Adrenals/Urinary Tract: Subcentimeter T1 hypointense, T2 hyperintense, nonenhancing lesions in both kidneys, compatible with simple cysts. Small amount of perinephric fluid bilaterally. No hydroureteronephrosis in the visualized portions of the abdomen. Bilateral adrenal glands are normal in appearance. Stomach/Bowel: Unremarkable. Vascular/Lymphatic: Aortic atherosclerosis, without evidence of aneurysm in the abdominal vasculature. No lymphadenopathy noted in the abdomen. Other: Small volume of ascites, most evident in the right upper quadrant, particularly adjacent to the gallbladder. Perinephric fluid bilaterally, as well as a small amount of peripancreatic fluid in the retroperitoneum. Musculoskeletal: No aggressive appearing osseous lesions are noted in the visualized portions of the skeleton. IMPRESSION: 1. Interval percutaneous cholecystostomy with decompression of the gallbladder. Previously noted gallstones are not confidently identified on today's examination. Gallbladder wall appears thickened and severely edematous with persistent pericholecystic fluid. 2. No choledocholithiasis or signs of biliary tract obstruction. 3. Periportal edema in the liver. 4. Edema in the retroperitoneum including both peripancreatic and perinephric fluid bilaterally. 5. Small right pleural effusion lying dependently with some associated passive subsegmental atelectasis in the right lower lobe. Electronically Signed   By: Vinnie Langton M.D.   On: 06/29/2020 06:15   CT IMAGE GUIDED DRAINAGE BY PERCUTANEOUS CATHETER  Result Date: 06/28/2020 INDICATION: Acute cholecystitis. Poor operative candidate. Please perform image guided cholecystostomy tube placement for  infection source control purposes. EXAM: ULTRASOUND AND FLUOROSCOPIC-GUIDED CHOLECYSTOSTOMY TUBE PLACEMENT COMPARISON:  CT abdomen and pelvis-06/27/2020 MEDICATIONS: The patient is currently admitted to the hospital and on intravenous antibiotics. Antibiotics were administered within an appropriate time frame prior to skin puncture. ANESTHESIA/SEDATION: Moderate (conscious) sedation was employed during this procedure. A total of Versed 0.5 mg and Fentanyl 25 mcg was administered intravenously. Moderate Sedation Time: 15 minutes. The patient's level of  consciousness and vital signs were monitored continuously by radiology nursing throughout the procedure under my direct supervision. CONTRAST:  None FLUOROSCOPY TIME:  Not provided COMPLICATIONS: None immediate. PROCEDURE: Informed written consent was obtained from the patient after a discussion of the risks, benefits and alternatives to treatment. Questions regarding the procedure were encouraged and answered. A timeout was performed prior to the initiation of the procedure. Patient was placed supine on the CT gantry with CT imaging demonstrating distension of the gallbladder with rather extensive amount pericholecystic fluid and layering echogenic gallstones or biliary sludge. The table position was marked and the gallbladder was identified sonographically. The right upper abdominal quadrant was prepped and draped in the usual sterile fashion, and a sterile drape was applied covering the operative field. Maximum barrier sterile technique with sterile gowns and gloves were used for the procedure. A timeout was performed prior to the initiation of the procedure. Local anesthesia was provided with 1% lidocaine with epinephrine. Ultrasound scanning of the right upper quadrant demonstrates a markedly dilated gallbladder with significant amount pericholecystic fluid. Of note, the patient reported pain with ultrasound imaging over the gallbladder. Utilizing a transhepatic  approach, an 18 gauge needle was advanced into the gallbladder under direct ultrasound guidance. An ultrasound image was saved for documentation purposes. A short Amplatz wire was coiled within the gallbladder lumen. Appropriate positioning was confirmed with CT imaging. The track was dilated allowing placement of a 10 French percutaneous drainage catheter with end coiled and locked within the gallbladder lumen. At this point, approximately 80 cc of bile was aspirated from the gallbladder. A small amount of aspirated bile was capped sent to laboratory for analysis. The cholecystostomy tube was flushed with a small amount of saline and connected to a gravity bag. Obscured place within interrupted suture and a Stat Lock device. A dressing was applied. The patient tolerated the procedure well without immediate postprocedural complication. IMPRESSION: Successful ultrasound and CT guided placement of a 10.2 French cholecystostomy tube. A small amount of aspirated bile was sent to the laboratory for analysis. Electronically Signed   By: Sandi Mariscal M.D.   On: 06/28/2020 11:41   US ABDOMEN LIMITED RUQ (LIVER/GB)  Result Date: 06/27/2020 CLINICAL DATA:  Elevated LFT EXAM: ULTRASOUND ABDOMEN LIMITED RIGHT UPPER QUADRANT COMPARISON:  CT 03/27/2016 FINDINGS: Gallbladder: Multiple shadowing stones in the gallbladder. Increased gallbladder wall thickness measuring 7.3 mm. Negative sonographic Murphy. Edematous appearing gallbladder wall with small amount of pericholecystic fluid. Common bile duct: Diameter: 3.6 mm Liver: Liver is echogenic. Previously described liver mass is not seen sonographically. Portal vein is patent on color Doppler imaging with normal direction of blood flow towards the liver. Other: Small amount of free fluid in the right upper quadrant IMPRESSION: 1. Cholelithiasis with gallbladder wall thickening/edema and small amount of pericholecystic fluid but negative sonographic Percell Miller, concern is raised for  cholecystitis despite negative sonographic Murphy. Correlation with nuclear medicine hepatobiliary imaging could be obtained for further evaluation 2. Diffusely echogenic liver consistent with steatosis and or hepatocellular disease 3. Small amount of right upper quadrant ascites Electronically Signed   By: Donavan Foil M.D.   On: 06/27/2020 22:51    Consults: Treatment Team:  Jules Husbands, MD Liana Gerold, MD   Subjective:    Overnight Issues: Only complaint is of swelling all over.  Pressors are being weaned.  Renal function continues to worsen.  Continues to decline Foley catheter.  Post void residual only 60 mL by bladder scanning.    ROS:  A 10 point review of systems was performed and it is as noted above otherwise negative.    Objective:  Vital signs for last 24 hours: Temp:  [97.5 F (36.4 C)-98 F (36.7 C)] 98 F (36.7 C) (10/24 0800) Pulse Rate:  [33-100] 78 (10/24 0900) Resp:  [12-25] 12 (10/24 0900) BP: (80-128)/(44-101) 111/87 (10/24 0900) SpO2:  [88 %-100 %] 97 % (10/24 0900)  Hemodynamic parameters for last 24 hours:    Intake/Output from previous day: 10/23 0701 - 10/24 0700 In: 2283.2 [I.V.:2083.2; IV Piggyback:200] Out: 1015 [Urine:825; Drains:190]  Intake/Output this shift: No intake/output data recorded.  Vent settings for last 24 hours:    Physical Exam:  GENERAL: Obese woman, no acute respiratory distress.  Awake, alert. HEAD: Normocephalic, atraumatic.  EYES: Pupils equal, round, reactive to light.  No scleral icterus.  MOUTH: Oral mucosa moist.  No thrush. NECK: Supple. No thyromegaly. Trachea midline. No JVD.  No adenopathy. PULMONARY: Good air entry bilaterally.  No adventitious sounds. CARDIOVASCULAR: S1 and S2. Regular rate and rhythm.  ABDOMEN: Obese, soft, no tenderness.  Percutaneous cholecystostomy tube right upper quadrant with bilious output MUSCULOSKELETAL: No joint deformity, no clubbing, anasarca 1-2+  NEUROLOGIC: Awake,  alert, no overt focal deficit.  Fluent speech. SKIN: Intact,warm,dry. PSYCH: Mood and behavior appropriate.  Assessment/Plan:   Severe Sepsis Due to cholecystitis Lactic Acidosis, resolved Continue supportive care Right upper quadrant drain placed Continue antibiotics Wean pressors as tolerated On stress dose steroids Trend procalcitonin General surgery following  Anion Gap Metabolic Acidosis Acute Renal Failure Trend lactic acid Decrease IV fluids Continues to decline Foley Difficult to assess I's and O's Renal consultation obtained today, appreciate input Patient does not want dialysis if this where necessary  Transaminitis due to cholecystitis  Complicated by chronic hepatic steatosis Transaminases on downward trend Biliary drain for cholecystitis in place Continue supportive care  ID E. coli and Klebsiella bacteremia Source is gallbladder Percutaneous biliary drain Ceftriaxone  HTN HLD home medications: amlodipine and metoprolol succinate on hold in the setting of hypotension Will restart home lipitor once patient is tolerating PO meds     LOS: 5 days   Additional comments: Patient updated, possible transfer out of SDU tomorrow with transition to [facility] hospitalist service.  Will obtain Palliative Care consult to establish goals of care as she is declining aggressive modes of therapy.  Critical Care Total Time*: Follow-up level 3  C. Derrill Kay, MD [facility] 07/02/2020  *Care during the described time interval was provided by me and/or other providers on the critical care team.  I have reviewed this patient's available data, including medical history, events of note, physical examination and test results as part of my evaluation.  **This note was dictated using voice recognition software/Dragon.  Despite best efforts to proofread, errors can occur which can change the meaning.  Any change was purely unintentional.

## 2020-07-03 ENCOUNTER — Encounter: Payer: Self-pay | Admitting: Internal Medicine

## 2020-07-03 DIAGNOSIS — A419 Sepsis, unspecified organism: Secondary | ICD-10-CM

## 2020-07-03 LAB — CBC WITH DIFFERENTIAL/PLATELET
Abs Immature Granulocytes: 0.44 10*3/uL — ABNORMAL HIGH (ref 0.00–0.07)
Basophils Absolute: 0 10*3/uL (ref 0.0–0.1)
Basophils Relative: 0 %
Eosinophils Absolute: 0 10*3/uL (ref 0.0–0.5)
Eosinophils Relative: 0 %
HCT: 29.5 % — ABNORMAL LOW (ref 36.0–46.0)
Hemoglobin: 10.5 g/dL — ABNORMAL LOW (ref 12.0–15.0)
Immature Granulocytes: 4 %
Lymphocytes Relative: 5 %
Lymphs Abs: 0.6 10*3/uL — ABNORMAL LOW (ref 0.7–4.0)
MCH: 36 pg — ABNORMAL HIGH (ref 26.0–34.0)
MCHC: 35.6 g/dL (ref 30.0–36.0)
MCV: 101 fL — ABNORMAL HIGH (ref 80.0–100.0)
Monocytes Absolute: 0.9 10*3/uL (ref 0.1–1.0)
Monocytes Relative: 9 %
Neutro Abs: 8.3 10*3/uL — ABNORMAL HIGH (ref 1.7–7.7)
Neutrophils Relative %: 82 %
Platelets: 144 10*3/uL — ABNORMAL LOW (ref 150–400)
RBC: 2.92 MIL/uL — ABNORMAL LOW (ref 3.87–5.11)
RDW: 14.9 % (ref 11.5–15.5)
WBC: 10.3 10*3/uL (ref 4.0–10.5)
nRBC: 1 % — ABNORMAL HIGH (ref 0.0–0.2)

## 2020-07-03 LAB — COMPREHENSIVE METABOLIC PANEL
ALT: 95 U/L — ABNORMAL HIGH (ref 0–44)
AST: 59 U/L — ABNORMAL HIGH (ref 15–41)
Albumin: 2.5 g/dL — ABNORMAL LOW (ref 3.5–5.0)
Alkaline Phosphatase: 183 U/L — ABNORMAL HIGH (ref 38–126)
Anion gap: 12 (ref 5–15)
BUN: 49 mg/dL — ABNORMAL HIGH (ref 8–23)
CO2: 17 mmol/L — ABNORMAL LOW (ref 22–32)
Calcium: 7.4 mg/dL — ABNORMAL LOW (ref 8.9–10.3)
Chloride: 107 mmol/L (ref 98–111)
Creatinine, Ser: 3.27 mg/dL — ABNORMAL HIGH (ref 0.44–1.00)
GFR, Estimated: 15 mL/min — ABNORMAL LOW (ref >60)
Glucose, Bld: 121 mg/dL — ABNORMAL HIGH (ref 70–99)
Potassium: 3.3 mmol/L — ABNORMAL LOW (ref 3.5–5.1)
Sodium: 136 mmol/L (ref 135–145)
Total Bilirubin: 1.2 mg/dL (ref 0.3–1.2)
Total Protein: 5.7 g/dL — ABNORMAL LOW (ref 6.5–8.1)

## 2020-07-03 LAB — PROCALCITONIN: Procalcitonin: 22.31 ng/mL

## 2020-07-03 LAB — GLUCOSE, CAPILLARY
Glucose-Capillary: 126 mg/dL — ABNORMAL HIGH (ref 70–99)
Glucose-Capillary: 112 mg/dL — ABNORMAL HIGH (ref 70–99)
Glucose-Capillary: 130 mg/dL — ABNORMAL HIGH (ref 70–99)
Glucose-Capillary: 180 mg/dL — ABNORMAL HIGH (ref 70–99)
Glucose-Capillary: 115 mg/dL — ABNORMAL HIGH (ref 70–99)

## 2020-07-03 LAB — AEROBIC/ANAEROBIC CULTURE W GRAM STAIN (SURGICAL/DEEP WOUND)

## 2020-07-03 LAB — TSH: TSH: 2.091 u[IU]/mL (ref 0.350–4.500)

## 2020-07-03 MED ORDER — POTASSIUM CHLORIDE 10 MEQ/100ML IV SOLN
10.0000 meq | INTRAVENOUS | Status: AC
  Administered 2020-07-03 (×4): 10 meq via INTRAVENOUS
  Filled 2020-07-03 (×4): qty 100

## 2020-07-03 NOTE — Progress Notes (Signed)
[facility] for Electrolyte Monitoring and Replacement   Recent Labs: Potassium (mmol/L)  Date Value  07/03/2020 3.3 (L)  09/09/2014 3.6   Magnesium (mg/dL)  Date Value  07/02/2020 2.0   Calcium (mg/dL)  Date Value  07/03/2020 7.4 (L)   Calcium, Total (mg/dL)  Date Value  09/09/2014 9.6   Albumin (g/dL)  Date Value  07/03/2020 2.5 (L)  09/09/2014 3.8   Phosphorus (mg/dL)  Date Value  06/30/2020 6.6 (H)   Sodium (mmol/L)  Date Value  07/03/2020 136  09/09/2014 135 (L)   Corrected Ca: 8.6 mg/dL  Assessment: 59 year old female with shock from acute cholecystitis. Elevation of LFTs. Patient s/p chole tube with culture sent. Patient requiring vasopressors and started on stress dose steroids. Blood culture with E.coli. Abscess culture with E.coli and K.oxytoca.  MIVF: D5 with sodium bicarb at 50 mL/hr   Goal of Therapy:  Electrolytes WNL   Plan:   10 mEq IV KCl per Dr Holley Raring   Re-check electrolytes in am  Dallie Piles ,PharmD, BCPS Clinical Pharmacist 07/03/2020 7:04 AM

## 2020-07-03 NOTE — Progress Notes (Signed)
CC: choleCystitis Subjective: Doing well.  Main issue is acidosis and acute kidney injury.  Creatinine is still up with some degree of oliguria. Cholecystostomy tube 45 cc.  She has tolerated diet Not interested in dialysis  Objective: Vital signs in last 24 hours: Temp:  [97.7 F (36.5 C)-98 F (36.7 C)] 98 F (36.7 C) (10/25 0800) Pulse Rate:  [75-91] 81 (10/25 1000) Resp:  [12-22] 19 (10/25 1000) BP: (107-137)/(73-108) 132/88 (10/25 1000) SpO2:  [92 %-98 %] 94 % (10/25 1000) Last BM Date: 07/02/20  Intake/Output from previous day: 10/24 0701 - 10/25 0700 In: 1203.5 [I.V.:1093.5; IV Piggyback:100] Out: 755 [Urine:475; Drains:280] Intake/Output this shift: Total I/O In: 555.9 [P.O.:360; I.V.:195.9] Out: 45 [Drains:45]  Physical exam:  NAD, alert Abd: soft, nt, cholecystostomy tube in place. No murphy and no peritonitis Ext: well perfused  Lab Results: CBC  Recent Labs    07/02/20 0616 07/03/20 0501  WBC 8.9 10.3  HGB 9.6* 10.5*  HCT 27.3* 29.5*  PLT 86* 144*   BMET Recent Labs    07/02/20 0616 07/03/20 0500  NA 135 136  K 3.6 3.3*  CL 106 107  CO2 17* 17*  GLUCOSE 130* 121*  BUN 47* 49*  CREATININE 3.22* 3.27*  CALCIUM 7.4* 7.4*   PT/INR Recent Labs    07/01/20 0625  LABPROT 12.7  INR 1.0   ABG No results for input(s): PHART, HCO3 in the last 72 hours.  Invalid input(s): PCO2, PO2  Studies/Results: ECHOCARDIOGRAM COMPLETE  Result Date: 07/02/2020    ECHOCARDIOGRAM REPORT   Patient Name:   ELAINE ROANHORSE Date of Exam: 07/02/2020 Medical Rec #:  [id number]         Height:       65.0 in Accession #:    [id number]        Weight:       215.2 lb Date of Birth:  1960-12-01         BSA:          2.041 m Patient Age:    59 years          BP:           106/81 mmHg Patient Gender: F                 HR:           79 bpm. Exam Location:  [facility] Procedure: 2D Echo, Cardiac Doppler and Color Doppler Indications:     Cardiomyopathy-unspecified 425.9   History:         Patient has no prior history of Echocardiogram examinations.                  Risk Factors:Hypertension.  Sonographer:     Sherrie Sport RDCS (AE) Referring Phys:  [id number] CARMEN Veda Canning Diagnosing Phys: Nelva Bush MD IMPRESSIONS  1. Left ventricular ejection fraction, by estimation, is 35 to 40%. The left ventricle has moderately decreased function. The left ventricle demonstrates global hypokinesis. There is mild left ventricular hypertrophy. Left ventricular diastolic parameters are consistent with Grade I diastolic dysfunction (impaired relaxation).  2. Right ventricular systolic function is low normal. The right ventricular size is normal. There is normal pulmonary artery systolic pressure.  3. Left atrial size was severely dilated.  4. Right atrial size was mildly dilated.  5. The mitral valve is normal in structure. Mild to moderate mitral valve regurgitation. No evidence of mitral stenosis.  6. The aortic valve is tricuspid. Aortic valve regurgitation  is mild. No aortic stenosis is present. FINDINGS  Left Ventricle: Left ventricular ejection fraction, by estimation, is 35 to 40%. The left ventricle has moderately decreased function. The left ventricle demonstrates global hypokinesis. The left ventricular internal cavity size was normal in size. There is mild left ventricular hypertrophy. Left ventricular diastolic parameters are consistent with Grade I diastolic dysfunction (impaired relaxation). Right Ventricle: The right ventricular size is normal. No increase in right ventricular wall thickness. Right ventricular systolic function is low normal. There is normal pulmonary artery systolic pressure. Left Atrium: Left atrial size was severely dilated. Right Atrium: Right atrial size was mildly dilated. Pericardium: There is no evidence of pericardial effusion. Mitral Valve: The mitral valve is normal in structure. Mild mitral annular calcification. Mild to moderate mitral valve  regurgitation. No evidence of mitral valve stenosis. Tricuspid Valve: The tricuspid valve is normal in structure. Tricuspid valve regurgitation is mild. Aortic Valve: The aortic valve is tricuspid. Aortic valve regurgitation is mild. No aortic stenosis is present. Aortic valve mean gradient measures 3.0 mmHg. Aortic valve peak gradient measures 5.3 mmHg. Aortic valve area, by VTI measures 2.43 cm. Pulmonic Valve: The pulmonic valve was grossly normal. Pulmonic valve regurgitation is not visualized. Aorta: The aortic root is normal in size and structure. Pulmonary Artery: The pulmonary artery is of normal size. Venous: The inferior vena cava was not well visualized. IAS/Shunts: The interatrial septum was not well visualized.  LEFT VENTRICLE PLAX 2D LVIDd:         4.86 cm      Diastology LVIDs:         3.93 cm      LV e' medial:    5.44 cm/s LV PW:         1.06 cm      LV E/e' medial:  11.3 LV IVS:        0.93 cm      LV e' lateral:   9.14 cm/s LVOT diam:     2.00 cm      LV E/e' lateral: 6.7 LV SV:         54 LV SV Index:   26 LVOT Area:     3.14 cm  LV Volumes (MOD) LV vol d, MOD A2C: 106.0 ml LV vol d, MOD A4C: 98.9 ml LV vol s, MOD A2C: 62.3 ml LV vol s, MOD A4C: 59.4 ml LV SV MOD A2C:     43.7 ml LV SV MOD A4C:     98.9 ml LV SV MOD BP:      39.8 ml RIGHT VENTRICLE RV Basal diam:  3.30 cm RV S prime:     10.40 cm/s TAPSE (M-mode): 2.2 cm LEFT ATRIUM              Index       RIGHT ATRIUM           Index LA diam:        4.90 cm  2.40 cm/m  RA Area:     16.90 cm LA Vol (A2C):   151.0 ml 74.00 ml/m RA Volume:   38.80 ml  19.01 ml/m LA Vol (A4C):   112.0 ml 54.89 ml/m LA Biplane Vol: 133.0 ml 65.18 ml/m  AORTIC VALVE                   PULMONIC VALVE AV Area (Vmax):    2.05 cm    PV Vmax:        0.63 m/s AV  Area (Vmean):   2.14 cm    PV Peak grad:   1.6 mmHg AV Area (VTI):     2.43 cm    RVOT Peak grad: 2 mmHg AV Vmax:           115.00 cm/s AV Vmean:          76.600 cm/s AV VTI:            0.221 m AV Peak  Grad:      5.3 mmHg AV Mean Grad:      3.0 mmHg LVOT Vmax:         75.10 cm/s LVOT Vmean:        52.300 cm/s LVOT VTI:          0.171 m LVOT/AV VTI ratio: 0.77  AORTA Ao Root diam: 3.20 cm MITRAL VALVE               TRICUSPID VALVE MV Area (PHT): 3.83 cm    TR Peak grad:   26.4 mmHg MV Decel Time: 198 msec    TR Vmax:        257.00 cm/s MV E velocity: 61.30 cm/s MV A velocity: 78.80 cm/s  SHUNTS MV E/A ratio:  0.78        Systemic VTI:  0.17 m                            Systemic Diam: 2.00 cm Nelva Bush MD Electronically signed by Nelva Bush MD Signature Date/Time: 07/02/2020/2:48:34 PM    Final     Anti-infectives: Anti-infectives (From admission, onward)   Start     Dose/Rate Route Frequency Ordered Stop   06/30/20 1700  cefTRIAXone (ROCEPHIN) 2 g in sodium chloride 0.9 % 100 mL IVPB        2 g 200 mL/hr over 30 Minutes Intravenous Every 24 hours 06/30/20 1552     06/27/20 2315  piperacillin-tazobactam (ZOSYN) IVPB 3.375 g  Status:  Discontinued        3.375 g 12.5 mL/hr over 240 Minutes Intravenous Every 8 hours 06/27/20 2312 06/30/20 1552   06/27/20 2100  metroNIDAZOLE (FLAGYL) IVPB 500 mg        500 mg 100 mL/hr over 60 Minutes Intravenous  Once 06/27/20 2054 06/27/20 2225   06/27/20 1930  cefTRIAXone (ROCEPHIN) 2 g in sodium chloride 0.9 % 100 mL IVPB        2 g 200 mL/hr over 30 Minutes Intravenous  Once 06/27/20 1926 06/27/20 2053      Assessment/Plan: Cholecystitis status post cholecystostomy tube now resolved She will need antibiotic therapy for 1 more week and she will need to continue cholecystostomy tube.   AdRenal insufficient from acute illness on steroids I will be happy to see her in my office as an outpatient. No Need for surgical intervention.  We will be available.  Rest of the medical issues per critical care team.    Caroleen Hamman, MD, [facility]  07/03/2020

## 2020-07-03 NOTE — Progress Notes (Signed)
Report called to Kim, RN.

## 2020-07-03 NOTE — Progress Notes (Signed)
Pt transferred to RM # [id number] at this time. VSS prior to transfer. Family aware.

## 2020-07-03 NOTE — Progress Notes (Signed)
Patient ID: Carrie Hardy, female   DOB: [birth year], 59 y.o.   MRN: [id number]  PCCM Today. [facility] hospitalist to follow from 07/04/2020  59 year old female presented to [facility] on 19th October with severe sepsis/E. coli due to cholecystitis requiring pressers. Patient is currently off pressers. Gallbladder drain placed. Surgery Dr. Dahlia Byes  recommending follow-up as outpatient. Nephrology consultation for acute renal failure/ATN/acidosis-- may require dialysis if urine output continues to decline however patient has been refusing it per ICU MD. Palliative care consultation placed by ICU attending.

## 2020-07-03 NOTE — Plan of Care (Signed)
Pt to room at this time via bed, pt is AxOx4. Pt has noted skin tear to mid lower abdomen at this time, Bruising noted to bilateral arms and legs. Pt also has a birthmark noted to mid forehead to the left side, nickel size. Spouse at bedside. Pt sacrum dressing changed at this time. Pt purewick placed at this time, IV lines switched to unit IV pole. Pt oriented to unit and how to call for help at this time. Tele placed #[id number] on patient, NSR. Pt came to unit with 3 bags of personnel belongings. Placed under cabinet. Bed alarm on at this time.

## 2020-07-03 NOTE — Progress Notes (Signed)
[facility]  ROUNDING NOTE   Subjective:  Patient seen and evaluated at bedside. Urine output only 475 cc over the preceding 24 hours. Renal function about the same with a creatinine of 3.27. Potassium a bit low at 3.3. Patient also has ongoing metabolic acidosis with most recent serum bicarbonate of 17.   Objective:  Vital signs in last 24 hours:  Temp:  [97.7 F (36.5 C)-98 F (36.7 C)] 98 F (36.7 C) (10/25 0800) Pulse Rate:  [75-91] 81 (10/25 1000) Resp:  [12-22] 19 (10/25 1000) BP: (107-137)/(73-108) 132/88 (10/25 1000) SpO2:  [92 %-98 %] 94 % (10/25 1000)  Weight change:  Filed Weights   06/29/20 0315 06/30/20 0419 07/01/20 0500  Weight: 86.2 kg 94.4 kg 97.6 kg    Intake/Output: I/O last 3 completed shifts: In: 1817.5 [I.V.:1702.5; Other:15; IV Piggyback:100] Out: 8768 [Urine:975; Drains:400]   Intake/Output this shift:  Total I/O In: 555.9 [P.O.:360; I.V.:195.9] Out: 45 [Drains:45]  Physical Exam: General:  No acute distress  Head:  Normocephalic, atraumatic. Moist oral mucosal membranes  Eyes:  Anicteric  Neck:  Supple  Lungs:   Clear to auscultation, normal effort  Heart:  S1S2 no rubs  Abdomen:   Soft, nontender, bowel sounds present  Extremities:  peripheral edema.  Neurologic:  Awake, alert, following commands  Skin:  No lesions  Access:  No dialysis access    Basic Metabolic Panel: Recent Labs  Lab 06/28/20 0336 06/28/20 0336 06/29/20 0342 06/29/20 0342 06/30/20 0415 06/30/20 0415 07/01/20 0625 07/02/20 0616 07/03/20 0500  NA 137   < > 136  --  135  --  133* 135 136  K 3.6   < > 4.0  --  3.6  --  3.9 3.6 3.3*  CL 104   < > 101  --  104  --  102 106 107  CO2 23   < > 17*  --  15*  --  16* 17* 17*  GLUCOSE 91   < > 117*  --  139*  --  165* 130* 121*  BUN 20   < > 29*  --  36*  --  41* 47* 49*  CREATININE 2.44*   < > 2.65*  --  2.75*  --  3.11* 3.22* 3.27*  CALCIUM 7.4*   < > 6.9*   < > 6.6*   < > 7.0* 7.4* 7.4*  MG 1.2*   --  2.1  --  1.7  --   --  2.0  --   PHOS 3.3  --  6.6*  --  6.6*  --   --   --   --    < > = values in this interval not displayed.    Liver Function Tests: Recent Labs  Lab 06/28/20 0336 06/29/20 0342 06/30/20 0415 07/01/20 0625 07/03/20 0500  AST 3,414* 902* 394* 201* 59*  ALT 536* 315* 215* 172* 95*  ALKPHOS 218* 134* 119 198* 183*  BILITOT 2.0* 2.8* 2.8* 2.3* 1.2  PROT 5.9* 5.8* 5.9* 6.2* 5.7*  ALBUMIN 2.8* 2.8* 2.8* 2.8* 2.5*   Recent Labs  Lab 06/27/20 1753  LIPASE 30   No results for input(s): AMMONIA in the last 168 hours.  CBC: Recent Labs  Lab 06/29/20 0342 06/30/20 0415 07/01/20 0625 07/02/20 0616 07/03/20 0501  WBC 14.7* 15.4* 9.8 8.9 10.3  NEUTROABS 13.9* 14.4* 8.5* 7.2 8.3*  HGB 10.1* 9.4* 9.9* 9.6* 10.5*  HCT 29.5* 27.6* 27.3* 27.3* 29.5*  MCV 106.9* 104.5* 102.6*  102.2* 101.0*  PLT 119* 88* 83* 86* 144*    Cardiac Enzymes: No results for input(s): CKTOTAL, CKMB, CKMBINDEX, TROPONINI in the last 168 hours.  BNP: Invalid input(s): POCBNP  CBG: Recent Labs  Lab 07/02/20 1554 07/02/20 1913 07/02/20 2353 07/03/20 0420 07/03/20 0738  GLUCAP 131* 117* 121* 126* 112*    Microbiology: Results for orders placed or performed during the hospital encounter of 06/27/20  Blood culture (routine single)     Status: Abnormal   Collection Time: 06/27/20  7:51 PM   Specimen: BLOOD  Result Value Ref Range Status   Specimen Description   Final    BLOOD LEFT ANTECUBITAL Performed at [facility], [address], [place], [place] [address]    Special Requests   Final    BOTTLES DRAWN AEROBIC AND ANAEROBIC Blood Culture adequate volume Performed at [facility], [address] [place], [place] [address]    Culture  Setup Time   Final    Organism ID to follow [place] AEROBIC AND ANAEROBIC BOTTLES CRITICAL RESULT CALLED TO, READ BACK BY AND VERIFIED WITHLloyd Huger AT [id number] 06/28/20 [place] Performed at  [facility], [address] [place], [place] [address]    Culture ESCHERICHIA COLI (A)  Final   Report Status 06/30/2020 FINAL  Final   Organism ID, Bacteria ESCHERICHIA COLI  Final      Susceptibility   Escherichia coli - MIC*    AMPICILLIN >=32 RESISTANT Resistant     CEFAZOLIN <=4 SENSITIVE Sensitive     CEFEPIME <=0.12 SENSITIVE Sensitive     CEFTAZIDIME <=1 SENSITIVE Sensitive     CEFTRIAXONE <=0.25 SENSITIVE Sensitive     CIPROFLOXACIN <=0.25 SENSITIVE Sensitive     GENTAMICIN <=1 SENSITIVE Sensitive     IMIPENEM <=0.25 SENSITIVE Sensitive     TRIMETH/SULFA <=20 SENSITIVE Sensitive     AMPICILLIN/SULBACTAM >=32 RESISTANT Resistant     PIP/TAZO <=4 SENSITIVE Sensitive     * ESCHERICHIA COLI  Respiratory Panel by RT PCR (Flu A&B, Covid) - Nasopharyngeal Swab     Status: None   Collection Time: 06/27/20  7:51 PM   Specimen: Nasopharyngeal Swab  Result Value Ref Range Status   SARS Coronavirus 2 by RT PCR NEGATIVE NEGATIVE Final    Comment: (NOTE) SARS-CoV-2 target nucleic acids are NOT DETECTED.  The SARS-CoV-2 RNA is generally detectable in upper respiratoy specimens during the acute phase of infection. The lowest concentration of SARS-CoV-2 viral copies this assay can detect is 131 copies/mL. A negative result does not preclude SARS-Cov-2 infection and should not be used as the sole basis for treatment or other patient management decisions. A negative result may occur with  improper specimen collection/handling, submission of specimen other than nasopharyngeal swab, presence of viral mutation(s) within the areas targeted by this assay, and inadequate number of viral copies (<131 copies/mL). A negative result must be combined with clinical observations, patient history, and epidemiological information. The expected result is Negative.  Fact Sheet for Patients:  PinkCheek.be  Fact Sheet for Healthcare Providers:   GravelBags.it  This test is no t yet approved or cleared by the [place] FDA and  has been authorized for detection and/or diagnosis of SARS-CoV-2 by FDA under an Emergency Use Authorization (EUA). This EUA will remain  in effect (meaning this test can be used) for the duration of the COVID-19 declaration under Section 564(b)(1) of the Act, 21 U.S.C. section 360bbb-3(b)(1), unless the authorization is terminated or  revoked sooner.     Influenza A by PCR NEGATIVE NEGATIVE Final   Influenza B by PCR NEGATIVE NEGATIVE Final    Comment: (NOTE) The Xpert Xpress SARS-CoV-2/FLU/RSV assay is intended as an aid in  the diagnosis of influenza from Nasopharyngeal swab specimens and  should not be used as a sole basis for treatment. Nasal washings and  aspirates are unacceptable for Xpert Xpress SARS-CoV-2/FLU/RSV  testing.  Fact Sheet for Patients: PinkCheek.be  Fact Sheet for Healthcare Providers: GravelBags.it  This test is not yet approved or cleared by the [place] FDA and  has been authorized for detection and/or diagnosis of SARS-CoV-2 by  FDA under an Emergency Use Authorization (EUA). This EUA will remain  in effect (meaning this test can be used) for the duration of the  Covid-19 declaration under Section 564(b)(1) of the Act, 21  U.S.C. section 360bbb-3(b)(1), unless the authorization is  terminated or revoked. Performed at [facility], [address] [place], [place] [address]   Blood Culture ID Panel (Reflexed)     Status: Abnormal   Collection Time: 06/27/20  7:51 PM  Result Value Ref Range Status   Enterococcus faecalis NOT DETECTED NOT DETECTED Final   Enterococcus Faecium NOT DETECTED NOT DETECTED Final   Listeria monocytogenes NOT DETECTED NOT DETECTED Final   Staphylococcus species NOT DETECTED NOT DETECTED Final   Staphylococcus aureus (BCID) NOT DETECTED NOT  DETECTED Final   Staphylococcus epidermidis NOT DETECTED NOT DETECTED Final   Staphylococcus lugdunensis NOT DETECTED NOT DETECTED Final   Streptococcus species NOT DETECTED NOT DETECTED Final   Streptococcus agalactiae NOT DETECTED NOT DETECTED Final   Streptococcus pneumoniae NOT DETECTED NOT DETECTED Final   Streptococcus pyogenes NOT DETECTED NOT DETECTED Final   A.calcoaceticus-baumannii NOT DETECTED NOT DETECTED Final   Bacteroides fragilis NOT DETECTED NOT DETECTED Final   Enterobacterales DETECTED (A) NOT DETECTED Final    Comment: Enterobacterales represent a large order of gram negative bacteria, not a single organism. CRITICAL RESULT CALLED TO, READ BACK BY AND VERIFIED WITH:  NATHAN BELUE AT 6295 06/28/20 SDR    Enterobacter cloacae complex NOT DETECTED NOT DETECTED Final   Escherichia coli DETECTED (A) NOT DETECTED Final    Comment: CRITICAL RESULT CALLED TO, READ BACK BY AND VERIFIED WITH:  NATHAN BELUE AT 2841 06/28/20 SDR    Klebsiella aerogenes NOT DETECTED NOT DETECTED Final   Klebsiella oxytoca NOT DETECTED NOT DETECTED Final   Klebsiella pneumoniae NOT DETECTED NOT DETECTED Final   Proteus species NOT DETECTED NOT DETECTED Final   Salmonella species NOT DETECTED NOT DETECTED Final   Serratia marcescens NOT DETECTED NOT DETECTED Final   Haemophilus influenzae NOT DETECTED NOT DETECTED Final   Neisseria meningitidis NOT DETECTED NOT DETECTED Final   Pseudomonas aeruginosa NOT DETECTED NOT DETECTED Final   Stenotrophomonas maltophilia NOT DETECTED NOT DETECTED Final   Candida albicans NOT DETECTED NOT DETECTED Final   Candida auris NOT DETECTED NOT DETECTED Final   Candida glabrata NOT DETECTED NOT DETECTED Final   Candida krusei NOT DETECTED NOT DETECTED Final   Candida parapsilosis NOT DETECTED NOT DETECTED Final   Candida tropicalis NOT DETECTED NOT DETECTED Final   Cryptococcus neoformans/gattii NOT DETECTED NOT DETECTED Final   CTX-M ESBL NOT DETECTED NOT  DETECTED Final   Carbapenem resistance IMP NOT DETECTED NOT DETECTED Final   Carbapenem resistance KPC NOT DETECTED NOT DETECTED Final   Carbapenem resistance NDM NOT DETECTED NOT DETECTED Final   Carbapenem resist OXA 48 LIKE  NOT DETECTED NOT DETECTED Final   Carbapenem resistance VIM NOT DETECTED NOT DETECTED Final    Comment: Performed at [facility], [address] [place], [place] [address]  MRSA PCR Screening     Status: None   Collection Time: 06/28/20  5:21 AM   Specimen: Nasopharyngeal  Result Value Ref Range Status   MRSA by PCR NEGATIVE NEGATIVE Final    Comment:        The GeneXpert MRSA Assay (FDA approved for NASAL specimens only), is one component of a comprehensive MRSA colonization surveillance program. It is not intended to diagnose MRSA infection nor to guide or monitor treatment for MRSA infections. Performed at [facility], [address] [place], [place] [address]   Aerobic/Anaerobic Culture (surgical/deep wound)     Status: None   Collection Time: 06/28/20 11:02 AM   Specimen: Fluid; Abscess  Result Value Ref Range Status   Specimen Description   Final    FLUID Performed at [facility], [address], [place], [place] [address]    Special Requests   Final    gallbladder fluid Performed at [facility], [place], [place] [address]    Gram Stain   Final    FEW WBC PRESENT,BOTH PMN AND MONONUCLEAR MODERATE GRAM POSITIVE RODS    Culture   Final    FEW ESCHERICHIA COLI RARE KLEBSIELLA OXYTOCA NO ANAEROBES ISOLATED Performed at [facility], [place] [address] [place], [place] [address]    Report Status 07/03/2020 FINAL  Final   Organism ID, Bacteria ESCHERICHIA COLI  Final   Organism ID, Bacteria KLEBSIELLA OXYTOCA  Final      Susceptibility   Escherichia coli - MIC*    AMPICILLIN >=32 RESISTANT Resistant     CEFAZOLIN <=4 SENSITIVE Sensitive     CEFEPIME <=0.12 SENSITIVE Sensitive      CEFTAZIDIME <=1 SENSITIVE Sensitive     CEFTRIAXONE <=0.25 SENSITIVE Sensitive     CIPROFLOXACIN <=0.25 SENSITIVE Sensitive     GENTAMICIN <=1 SENSITIVE Sensitive     IMIPENEM <=0.25 SENSITIVE Sensitive     TRIMETH/SULFA <=20 SENSITIVE Sensitive     AMPICILLIN/SULBACTAM 16 INTERMEDIATE Intermediate     PIP/TAZO <=4 SENSITIVE Sensitive     * FEW ESCHERICHIA COLI   Klebsiella oxytoca - MIC*    AMPICILLIN >=32 RESISTANT Resistant     CEFAZOLIN <=4 SENSITIVE Sensitive     CEFEPIME <=0.12 SENSITIVE Sensitive     CEFTAZIDIME <=1 SENSITIVE Sensitive     CEFTRIAXONE <=0.25 SENSITIVE Sensitive     CIPROFLOXACIN <=0.25 SENSITIVE Sensitive     GENTAMICIN <=1 SENSITIVE Sensitive     IMIPENEM <=0.25 SENSITIVE Sensitive     TRIMETH/SULFA <=20 SENSITIVE Sensitive     AMPICILLIN/SULBACTAM 8 SENSITIVE Sensitive     PIP/TAZO <=4 SENSITIVE Sensitive     * RARE KLEBSIELLA OXYTOCA    Coagulation Studies: Recent Labs    07/01/20 0625  LABPROT 12.7  INR 1.0    Urinalysis: No results for input(s): COLORURINE, LABSPEC, PHURINE, GLUCOSEU, HGBUR, BILIRUBINUR, KETONESUR, PROTEINUR, UROBILINOGEN, NITRITE, LEUKOCYTESUR in the last 72 hours.  Invalid input(s): APPERANCEUR    Imaging: ECHOCARDIOGRAM COMPLETE  Result Date: 07/02/2020    ECHOCARDIOGRAM REPORT   Patient Name:   ROSINA CRESSLER Date of Exam: 07/02/2020 Medical Rec #:  [id number]         Height:       65.0 in Accession #:    [id number]  Weight:       215.2 lb Date of Birth:  Nov 11, 1960         BSA:          2.041 m Patient Age:    59 years          BP:           106/81 mmHg Patient Gender: F                 HR:           79 bpm. Exam Location:  [facility] Procedure: 2D Echo, Cardiac Doppler and Color Doppler Indications:     Cardiomyopathy-unspecified 425.9  History:         Patient has no prior history of Echocardiogram examinations.                  Risk Factors:Hypertension.  Sonographer:     Sherrie Sport RDCS (AE) Referring Phys:  [id number]  CARMEN Veda Canning Diagnosing Phys: Nelva Bush MD IMPRESSIONS  1. Left ventricular ejection fraction, by estimation, is 35 to 40%. The left ventricle has moderately decreased function. The left ventricle demonstrates global hypokinesis. There is mild left ventricular hypertrophy. Left ventricular diastolic parameters are consistent with Grade I diastolic dysfunction (impaired relaxation).  2. Right ventricular systolic function is low normal. The right ventricular size is normal. There is normal pulmonary artery systolic pressure.  3. Left atrial size was severely dilated.  4. Right atrial size was mildly dilated.  5. The mitral valve is normal in structure. Mild to moderate mitral valve regurgitation. No evidence of mitral stenosis.  6. The aortic valve is tricuspid. Aortic valve regurgitation is mild. No aortic stenosis is present. FINDINGS  Left Ventricle: Left ventricular ejection fraction, by estimation, is 35 to 40%. The left ventricle has moderately decreased function. The left ventricle demonstrates global hypokinesis. The left ventricular internal cavity size was normal in size. There is mild left ventricular hypertrophy. Left ventricular diastolic parameters are consistent with Grade I diastolic dysfunction (impaired relaxation). Right Ventricle: The right ventricular size is normal. No increase in right ventricular wall thickness. Right ventricular systolic function is low normal. There is normal pulmonary artery systolic pressure. Left Atrium: Left atrial size was severely dilated. Right Atrium: Right atrial size was mildly dilated. Pericardium: There is no evidence of pericardial effusion. Mitral Valve: The mitral valve is normal in structure. Mild mitral annular calcification. Mild to moderate mitral valve regurgitation. No evidence of mitral valve stenosis. Tricuspid Valve: The tricuspid valve is normal in structure. Tricuspid valve regurgitation is mild. Aortic Valve: The aortic valve is  tricuspid. Aortic valve regurgitation is mild. No aortic stenosis is present. Aortic valve mean gradient measures 3.0 mmHg. Aortic valve peak gradient measures 5.3 mmHg. Aortic valve area, by VTI measures 2.43 cm. Pulmonic Valve: The pulmonic valve was grossly normal. Pulmonic valve regurgitation is not visualized. Aorta: The aortic root is normal in size and structure. Pulmonary Artery: The pulmonary artery is of normal size. Venous: The inferior vena cava was not well visualized. IAS/Shunts: The interatrial septum was not well visualized.  LEFT VENTRICLE PLAX 2D LVIDd:         4.86 cm      Diastology LVIDs:         3.93 cm      LV e' medial:    5.44 cm/s LV PW:         1.06 cm      LV E/e' medial:  11.3  LV IVS:        0.93 cm      LV e' lateral:   9.14 cm/s LVOT diam:     2.00 cm      LV E/e' lateral: 6.7 LV SV:         54 LV SV Index:   26 LVOT Area:     3.14 cm  LV Volumes (MOD) LV vol d, MOD A2C: 106.0 ml LV vol d, MOD A4C: 98.9 ml LV vol s, MOD A2C: 62.3 ml LV vol s, MOD A4C: 59.4 ml LV SV MOD A2C:     43.7 ml LV SV MOD A4C:     98.9 ml LV SV MOD BP:      39.8 ml RIGHT VENTRICLE RV Basal diam:  3.30 cm RV S prime:     10.40 cm/s TAPSE (M-mode): 2.2 cm LEFT ATRIUM              Index       RIGHT ATRIUM           Index LA diam:        4.90 cm  2.40 cm/m  RA Area:     16.90 cm LA Vol (A2C):   151.0 ml 74.00 ml/m RA Volume:   38.80 ml  19.01 ml/m LA Vol (A4C):   112.0 ml 54.89 ml/m LA Biplane Vol: 133.0 ml 65.18 ml/m  AORTIC VALVE                   PULMONIC VALVE AV Area (Vmax):    2.05 cm    PV Vmax:        0.63 m/s AV Area (Vmean):   2.14 cm    PV Peak grad:   1.6 mmHg AV Area (VTI):     2.43 cm    RVOT Peak grad: 2 mmHg AV Vmax:           115.00 cm/s AV Vmean:          76.600 cm/s AV VTI:            0.221 m AV Peak Grad:      5.3 mmHg AV Mean Grad:      3.0 mmHg LVOT Vmax:         75.10 cm/s LVOT Vmean:        52.300 cm/s LVOT VTI:          0.171 m LVOT/AV VTI ratio: 0.77  AORTA Ao Root diam: 3.20 cm  MITRAL VALVE               TRICUSPID VALVE MV Area (PHT): 3.83 cm    TR Peak grad:   26.4 mmHg MV Decel Time: 198 msec    TR Vmax:        257.00 cm/s MV E velocity: 61.30 cm/s MV A velocity: 78.80 cm/s  SHUNTS MV E/A ratio:  0.78        Systemic VTI:  0.17 m                            Systemic Diam: 2.00 cm Nelva Bush MD Electronically signed by Nelva Bush MD Signature Date/Time: 07/02/2020/2:48:34 PM    Final      Medications:   . sodium chloride 10 mL/hr at 07/03/20 0916  . cefTRIAXone (ROCEPHIN)  IV Stopped (07/02/20 1805)  . dextrose 5 % 1,000 mL with sodium bicarbonate 75 mEq infusion 50 mL/hr at  07/03/20 0916   . Chlorhexidine Gluconate Cloth  6 each Topical Q0600  . hydrocortisone sod succinate (SOLU-CORTEF) inj  50 mg Intravenous Q6H  . pantoprazole (PROTONIX) IV  40 mg Intravenous QHS  . sodium chloride flush  5 mL Intracatheter Q8H   docusate sodium, loperamide, morphine injection, ondansetron (ZOFRAN) IV, polyethylene glycol  Assessment/ Plan:  59 y.o. female with past medical history of hypertension, hyperlipidemia, nonalcoholic fatty liver disease who presented with cholelithiasis and gallbladder wall thickening consistent with cholecystitis.  1.  Acute kidney injury secondary to ATN.  Baseline creatinine 0.7.  Suspect acute kidney injury secondary to sepsis from biliary source.  Urine output noted to be down.  No immediate need for dialysis but this may need to be considered if renal function continues to deteriorate.  2.  Metabolic acidosis.  Serum bicarbonate 17.  Agree with sodium bicarbonate infusion.  3.  Hypokalemia.  Administer potassium chloride 40 mEq IV today.   LOS: 6 Aaryana Betke 10/25/202110:53 AM

## 2020-07-03 NOTE — Progress Notes (Signed)
Follow up - Critical Care Medicine Note  Patient Details:    Carrie Hardy is an 59 y.o. female presented to [facility] on 10/19 with severe sepsis due to cholecystitis requiring vasopressors.  Admitted to ICU for further management.  Lines, Airways, Drains: CVC Triple Lumen 06/28/20 Right Femoral (Active)  Indication for Insertion or Continuance of Line Vasoactive infusions 07/01/20 0800  Site Assessment Clean;Dry;Intact 07/01/20 0800  Proximal Lumen Status Infusing 07/01/20 0800  Medial Lumen Status Infusing 07/01/20 0800  Distal Lumen Status Infusing 07/01/20 0800  Dressing Type Transparent;Occlusive 07/01/20 0800  Dressing Status Clean;Dry;Intact 07/01/20 0800  Antimicrobial disc in place? Yes 07/01/20 0800  Line Care Connections checked and tightened 07/01/20 0800  Dressing Intervention Dressing changed 06/30/20 0400  Dressing Change Due 07/07/20 07/01/20 0800     Biliary Tube RLQ (Active)  Site Description Unremarkable 07/01/20 1200  Dressing Status Clean;Dry;Intact 07/01/20 1200  Drainage Color Brown 07/01/20 1200  Tube Status Open to gravity drainage 07/01/20 1200  Output (mL) 90 mL 07/01/20 0630     External Urinary Catheter (Active)  Collection Container Dedicated Suction Canister 07/01/20 1200  Securement Method Securing device (Describe) 07/01/20 1200  Site Assessment Clean;Intact 07/01/20 1200  Intervention Equipment Changed 06/30/20 2015  Output (mL) 50 mL 07/01/20 0700    Anti-infectives:  Anti-infectives (From admission, onward)   Start     Dose/Rate Route Frequency Ordered Stop   06/30/20 1700  cefTRIAXone (ROCEPHIN) 2 g in sodium chloride 0.9 % 100 mL IVPB        2 g 200 mL/hr over 30 Minutes Intravenous Every 24 hours 06/30/20 1552     06/27/20 2315  piperacillin-tazobactam (ZOSYN) IVPB 3.375 g  Status:  Discontinued        3.375 g 12.5 mL/hr over 240 Minutes Intravenous Every 8 hours 06/27/20 2312 06/30/20 1552   06/27/20 2100  metroNIDAZOLE (FLAGYL) IVPB  500 mg        500 mg 100 mL/hr over 60 Minutes Intravenous  Once 06/27/20 2054 06/27/20 2225   06/27/20 1930  cefTRIAXone (ROCEPHIN) 2 g in sodium chloride 0.9 % 100 mL IVPB        2 g 200 mL/hr over 30 Minutes Intravenous  Once 06/27/20 1926 06/27/20 2053     Scheduled Meds: . Chlorhexidine Gluconate Cloth  6 each Topical Q0600  . hydrocortisone sod succinate (SOLU-CORTEF) inj  50 mg Intravenous Q6H  . pantoprazole (PROTONIX) IV  40 mg Intravenous QHS  . sodium chloride flush  5 mL Intracatheter Q8H   Continuous Infusions: . sodium chloride 10 mL/hr at 07/03/20 0916  . cefTRIAXone (ROCEPHIN)  IV Stopped (07/02/20 1805)  . dextrose 5 % 1,000 mL with sodium bicarbonate 75 mEq infusion 50 mL/hr at 07/03/20 0916   PRN Meds:.docusate sodium, loperamide, morphine injection, ondansetron (ZOFRAN) IV, polyethylene glycol   Microbiology: Results for orders placed or performed during the hospital encounter of 06/27/20  Blood culture (routine single)     Status: Abnormal   Collection Time: 06/27/20  7:51 PM   Specimen: BLOOD  Result Value Ref Range Status   Specimen Description   Final    BLOOD LEFT ANTECUBITAL Performed at [facility], [address] [place], [place] [address]    Special Requests   Final    BOTTLES DRAWN AEROBIC AND ANAEROBIC Blood Culture adequate volume Performed at [facility], [address] [place], [place] [address]    Culture  Setup Time   Final    Organism ID  to follow GRAM NEGATIVE RODS IN BOTH AEROBIC AND ANAEROBIC BOTTLES CRITICAL RESULT CALLED TO, READ BACK BY AND VERIFIED WITHLloyd Huger AT [id number] 06/28/20 [place] Performed at [facility], [address] [place], [place] [address]    Culture ESCHERICHIA COLI (A)  Final   Report Status 06/30/2020 FINAL  Final   Organism ID, Bacteria ESCHERICHIA COLI  Final      Susceptibility   Escherichia coli - MIC*    AMPICILLIN >=32 RESISTANT Resistant     CEFAZOLIN <=4  SENSITIVE Sensitive     CEFEPIME <=0.12 SENSITIVE Sensitive     CEFTAZIDIME <=1 SENSITIVE Sensitive     CEFTRIAXONE <=0.25 SENSITIVE Sensitive     CIPROFLOXACIN <=0.25 SENSITIVE Sensitive     GENTAMICIN <=1 SENSITIVE Sensitive     IMIPENEM <=0.25 SENSITIVE Sensitive     TRIMETH/SULFA <=20 SENSITIVE Sensitive     AMPICILLIN/SULBACTAM >=32 RESISTANT Resistant     PIP/TAZO <=4 SENSITIVE Sensitive     * ESCHERICHIA COLI  Respiratory Panel by RT PCR (Flu A&B, Covid) - Nasopharyngeal Swab     Status: None   Collection Time: 06/27/20  7:51 PM   Specimen: Nasopharyngeal Swab  Result Value Ref Range Status   SARS Coronavirus 2 by RT PCR NEGATIVE NEGATIVE Final    Comment: (NOTE) SARS-CoV-2 target nucleic acids are NOT DETECTED.  The SARS-CoV-2 RNA is generally detectable in upper respiratoy specimens during the acute phase of infection. The lowest concentration of SARS-CoV-2 viral copies this assay can detect is 131 copies/mL. A negative result does not preclude SARS-Cov-2 infection and should not be used as the sole basis for treatment or other patient management decisions. A negative result may occur with  improper specimen collection/handling, submission of specimen other than nasopharyngeal swab, presence of viral mutation(s) within the areas targeted by this assay, and inadequate number of viral copies (<131 copies/mL). A negative result must be combined with clinical observations, patient history, and epidemiological information. The expected result is Negative.  Fact Sheet for Patients:  PinkCheek.be  Fact Sheet for Healthcare Providers:  GravelBags.it  This test is no t yet approved or cleared by the [place] FDA and  has been authorized for detection and/or diagnosis of SARS-CoV-2 by FDA under an Emergency Use Authorization (EUA). This EUA will remain  in effect (meaning this test can be used) for the duration of  the COVID-19 declaration under Section 564(b)(1) of the Act, 21 U.S.C. section 360bbb-3(b)(1), unless the authorization is terminated or revoked sooner.     Influenza A by PCR NEGATIVE NEGATIVE Final   Influenza B by PCR NEGATIVE NEGATIVE Final    Comment: (NOTE) The Xpert Xpress SARS-CoV-2/FLU/RSV assay is intended as an aid in  the diagnosis of influenza from Nasopharyngeal swab specimens and  should not be used as a sole basis for treatment. Nasal washings and  aspirates are unacceptable for Xpert Xpress SARS-CoV-2/FLU/RSV  testing.  Fact Sheet for Patients: PinkCheek.be  Fact Sheet for Healthcare Providers: GravelBags.it  This test is not yet approved or cleared by the [place] FDA and  has been authorized for detection and/or diagnosis of SARS-CoV-2 by  FDA under an Emergency Use Authorization (EUA). This EUA will remain  in effect (meaning this test can be used) for the duration of the  Covid-19 declaration under Section 564(b)(1) of the Act, 21  U.S.C. section 360bbb-3(b)(1), unless the authorization is  terminated or revoked. Performed at [facility], [address] [place], [place] [address]  Blood Culture ID Panel (Reflexed)     Status: Abnormal   Collection Time: 06/27/20  7:51 PM  Result Value Ref Range Status   Enterococcus faecalis NOT DETECTED NOT DETECTED Final   Enterococcus Faecium NOT DETECTED NOT DETECTED Final   Listeria monocytogenes NOT DETECTED NOT DETECTED Final   Staphylococcus species NOT DETECTED NOT DETECTED Final   Staphylococcus aureus (BCID) NOT DETECTED NOT DETECTED Final   Staphylococcus epidermidis NOT DETECTED NOT DETECTED Final   Staphylococcus lugdunensis NOT DETECTED NOT DETECTED Final   Streptococcus species NOT DETECTED NOT DETECTED Final   Streptococcus agalactiae NOT DETECTED NOT DETECTED Final   Streptococcus pneumoniae NOT DETECTED NOT DETECTED Final    Streptococcus pyogenes NOT DETECTED NOT DETECTED Final   A.calcoaceticus-baumannii NOT DETECTED NOT DETECTED Final   Bacteroides fragilis NOT DETECTED NOT DETECTED Final   Enterobacterales DETECTED (A) NOT DETECTED Final    Comment: Enterobacterales represent a large order of gram negative bacteria, not a single organism. CRITICAL RESULT CALLED TO, READ BACK BY AND VERIFIED WITH:  NATHAN BELUE AT 2841 06/28/20 SDR    Enterobacter cloacae complex NOT DETECTED NOT DETECTED Final   Escherichia coli DETECTED (A) NOT DETECTED Final    Comment: CRITICAL RESULT CALLED TO, READ BACK BY AND VERIFIED WITH:  NATHAN BELUE AT 3244 06/28/20 SDR    Klebsiella aerogenes NOT DETECTED NOT DETECTED Final   Klebsiella oxytoca NOT DETECTED NOT DETECTED Final   Klebsiella pneumoniae NOT DETECTED NOT DETECTED Final   Proteus species NOT DETECTED NOT DETECTED Final   Salmonella species NOT DETECTED NOT DETECTED Final   Serratia marcescens NOT DETECTED NOT DETECTED Final   Haemophilus influenzae NOT DETECTED NOT DETECTED Final   Neisseria meningitidis NOT DETECTED NOT DETECTED Final   Pseudomonas aeruginosa NOT DETECTED NOT DETECTED Final   Stenotrophomonas maltophilia NOT DETECTED NOT DETECTED Final   Candida albicans NOT DETECTED NOT DETECTED Final   Candida auris NOT DETECTED NOT DETECTED Final   Candida glabrata NOT DETECTED NOT DETECTED Final   Candida krusei NOT DETECTED NOT DETECTED Final   Candida parapsilosis NOT DETECTED NOT DETECTED Final   Candida tropicalis NOT DETECTED NOT DETECTED Final   Cryptococcus neoformans/gattii NOT DETECTED NOT DETECTED Final   CTX-M ESBL NOT DETECTED NOT DETECTED Final   Carbapenem resistance IMP NOT DETECTED NOT DETECTED Final   Carbapenem resistance KPC NOT DETECTED NOT DETECTED Final   Carbapenem resistance NDM NOT DETECTED NOT DETECTED Final   Carbapenem resist OXA 48 LIKE NOT DETECTED NOT DETECTED Final   Carbapenem resistance VIM NOT DETECTED NOT DETECTED  Final    Comment: Performed at [facility], [address] [place], [place] [address]  MRSA PCR Screening     Status: None   Collection Time: 06/28/20  5:21 AM   Specimen: Nasopharyngeal  Result Value Ref Range Status   MRSA by PCR NEGATIVE NEGATIVE Final    Comment:        The GeneXpert MRSA Assay (FDA approved for NASAL specimens only), is one component of a comprehensive MRSA colonization surveillance program. It is not intended to diagnose MRSA infection nor to guide or monitor treatment for MRSA infections. Performed at [facility], [address] [place], [place] [address]   Aerobic/Anaerobic Culture (surgical/deep wound)     Status: None (Preliminary result)   Collection Time: 06/28/20 11:02 AM   Specimen: Fluid; Abscess  Result Value Ref Range Status   Specimen Description   Final    FLUID Performed at [facility], [address], [place], [place] [address]    Special Requests   Final    gallbladder fluid Performed at [facility], [place], [place] [address]    Gram Stain   Final    FEW WBC PRESENT,BOTH PMN AND MONONUCLEAR MODERATE GRAM POSITIVE RODS Performed at [facility], [place] [address] [place], [place] [address]    Culture   Final    FEW ESCHERICHIA COLI RARE KLEBSIELLA OXYTOCA NO ANAEROBES ISOLATED; CULTURE IN PROGRESS FOR 5 DAYS    Report Status PENDING  Incomplete   Organism ID, Bacteria ESCHERICHIA COLI  Final   Organism ID, Bacteria KLEBSIELLA OXYTOCA  Final      Susceptibility   Escherichia coli - MIC*    AMPICILLIN >=32 RESISTANT Resistant     CEFAZOLIN <=4 SENSITIVE Sensitive     CEFEPIME <=0.12 SENSITIVE Sensitive     CEFTAZIDIME <=1 SENSITIVE Sensitive     CEFTRIAXONE <=0.25 SENSITIVE Sensitive     CIPROFLOXACIN <=0.25 SENSITIVE Sensitive     GENTAMICIN <=1 SENSITIVE Sensitive     IMIPENEM <=0.25 SENSITIVE Sensitive     TRIMETH/SULFA <=20 SENSITIVE Sensitive      AMPICILLIN/SULBACTAM 16 INTERMEDIATE Intermediate     PIP/TAZO <=4 SENSITIVE Sensitive     * FEW ESCHERICHIA COLI   Klebsiella oxytoca - MIC*    AMPICILLIN >=32 RESISTANT Resistant     CEFAZOLIN <=4 SENSITIVE Sensitive     CEFEPIME <=0.12 SENSITIVE Sensitive     CEFTAZIDIME <=1 SENSITIVE Sensitive     CEFTRIAXONE <=0.25 SENSITIVE Sensitive     CIPROFLOXACIN <=0.25 SENSITIVE Sensitive     GENTAMICIN <=1 SENSITIVE Sensitive     IMIPENEM <=0.25 SENSITIVE Sensitive     TRIMETH/SULFA <=20 SENSITIVE Sensitive     AMPICILLIN/SULBACTAM 8 SENSITIVE Sensitive     PIP/TAZO <=4 SENSITIVE Sensitive     * RARE KLEBSIELLA OXYTOCA   Results for orders placed or performed during the hospital encounter of 06/27/20 (from the past 24 hour(s))  Glucose, capillary     Status: Abnormal   Collection Time: 07/02/20 11:27 AM  Result Value Ref Range   Glucose-Capillary 115 (H) 70 - 99 mg/dL  Glucose, capillary     Status: Abnormal   Collection Time: 07/02/20  3:54 PM  Result Value Ref Range   Glucose-Capillary 131 (H) 70 - 99 mg/dL  Creatinine, urine, random     Status: None   Collection Time: 07/02/20  5:41 PM  Result Value Ref Range   Creatinine, Urine 48 mg/dL  Sodium, urine, random     Status: None   Collection Time: 07/02/20  5:41 PM  Result Value Ref Range   Sodium, Ur <10 mmol/L  Glucose, capillary     Status: Abnormal   Collection Time: 07/02/20  7:13 PM  Result Value Ref Range   Glucose-Capillary 117 (H) 70 - 99 mg/dL  Glucose, capillary     Status: Abnormal   Collection Time: 07/02/20 11:53 PM  Result Value Ref Range   Glucose-Capillary 121 (H) 70 - 99 mg/dL  Glucose, capillary     Status: Abnormal   Collection Time: 07/03/20  4:20 AM  Result Value Ref Range   Glucose-Capillary 126 (H) 70 - 99 mg/dL  Comprehensive metabolic panel     Status: Abnormal   Collection Time: 07/03/20  5:00 AM  Result Value Ref Range   Sodium 136 135 - 145 mmol/L   Potassium 3.3 (L) 3.5 - 5.1 mmol/L  Chloride 107 98 - 111 mmol/L   CO2 17 (L) 22 - 32 mmol/L   Glucose, Bld 121 (H) 70 - 99 mg/dL   BUN 49 (H) 8 - 23 mg/dL   Creatinine, Ser 3.27 (H) 0.44 - 1.00 mg/dL   Calcium 7.4 (L) 8.9 - 10.3 mg/dL   Total Protein 5.7 (L) 6.5 - 8.1 g/dL   Albumin 2.5 (L) 3.5 - 5.0 g/dL   AST 59 (H) 15 - 41 U/L   ALT 95 (H) 0 - 44 U/L   Alkaline Phosphatase 183 (H) 38 - 126 U/L   Total Bilirubin 1.2 0.3 - 1.2 mg/dL   GFR, Estimated 15 (L) >60 mL/min   Anion gap 12 5 - 15  CBC with Differential/Platelet     Status: Abnormal   Collection Time: 07/03/20  5:01 AM  Result Value Ref Range   WBC 10.3 4.0 - 10.5 K/uL   RBC 2.92 (L) 3.87 - 5.11 MIL/uL   Hemoglobin 10.5 (L) 12.0 - 15.0 g/dL   HCT 29.5 (L) 36 - 46 %   MCV 101.0 (H) 80.0 - 100.0 fL   MCH 36.0 (H) 26.0 - 34.0 pg   MCHC 35.6 30.0 - 36.0 g/dL   RDW 14.9 11.5 - 15.5 %   Platelets 144 (L) 150 - 400 K/uL   nRBC 1.0 (H) 0.0 - 0.2 %   Neutrophils Relative % 82 %   Neutro Abs 8.3 (H) 1.7 - 7.7 K/uL   Lymphocytes Relative 5 %   Lymphs Abs 0.6 (L) 0.7 - 4.0 K/uL   Monocytes Relative 9 %   Monocytes Absolute 0.9 0.1 - 1.0 K/uL   Eosinophils Relative 0 %   Eosinophils Absolute 0.0 0.0 - 0.5 K/uL   Basophils Relative 0 %   Basophils Absolute 0.0 0.0 - 0.1 K/uL   Immature Granulocytes 4 %   Abs Immature Granulocytes 0.44 (H) 0.00 - 0.07 K/uL  Procalcitonin     Status: None   Collection Time: 07/03/20  5:01 AM  Result Value Ref Range   Procalcitonin 22.31 ng/mL  TSH     Status: None   Collection Time: 07/03/20  5:01 AM  Result Value Ref Range   TSH 2.091 0.350 - 4.500 uIU/mL  Glucose, capillary     Status: Abnormal   Collection Time: 07/03/20  7:38 AM  Result Value Ref Range   Glucose-Capillary 112 (H) 70 - 99 mg/dL     Best Practice/Protocols:  VTE Prophylaxis: Mechanical GI Prophylaxis: Proton Pump Inhibitor Hyperglycemia (ICU) Heparin SQ held due to  thrombocytopenia   Events: 10/19 Admit to ICU 10/20 GB drain placed on  pressors, E. coli/Klebsiella bacteremia 10/22 on pressors, weaning down 10/23 renal function worsening, off of pressors 10/24 persistent worsening of renal function, nephrology consult obtained 10/25 remains off of pressors, renal function   Studies: CT HEAD WO CONTRAST  Result Date: 06/27/2020 CLINICAL DATA:  Lightheaded. EXAM: CT HEAD WITHOUT CONTRAST TECHNIQUE: Contiguous axial images were obtained from the base of the skull through the vertex without intravenous contrast. COMPARISON:  None. FINDINGS: Brain: There is mild cerebral atrophy with widening of the extra-axial spaces and ventricular dilatation. There are areas of decreased attenuation within the white matter tracts of the supratentorial brain, consistent with microvascular disease changes. Vascular: No hyperdense vessel or unexpected calcification. Skull: Normal. Negative for fracture or focal lesion. Sinuses/Orbits: No acute finding. Other: None. IMPRESSION: No acute intracranial pathology. Electronically Signed   By: Virgina Norfolk M.D.   On:  06/27/2020 18:10   [place] Intraoperative  Result Date: 06/28/2020 INDICATION: Acute cholecystitis. Poor operative candidate. Please perform image guided cholecystostomy tube placement for infection source control purposes. EXAM: ULTRASOUND AND FLUOROSCOPIC-GUIDED CHOLECYSTOSTOMY TUBE PLACEMENT COMPARISON:  CT abdomen and pelvis-06/27/2020 MEDICATIONS: The patient is currently admitted to the hospital and on intravenous antibiotics. Antibiotics were administered within an appropriate time frame prior to skin puncture. ANESTHESIA/SEDATION: Moderate (conscious) sedation was employed during this procedure. A total of Versed 0.5 mg and Fentanyl 25 mcg was administered intravenously. Moderate Sedation Time: 15 minutes. The patient's level of consciousness and vital signs were monitored continuously by radiology nursing throughout the procedure under my direct supervision. CONTRAST:  None FLUOROSCOPY  TIME:  Not provided COMPLICATIONS: None immediate. PROCEDURE: Informed written consent was obtained from the patient after a discussion of the risks, benefits and alternatives to treatment. Questions regarding the procedure were encouraged and answered. A timeout was performed prior to the initiation of the procedure. Patient was placed supine on the CT gantry with CT imaging demonstrating distension of the gallbladder with rather extensive amount pericholecystic fluid and layering echogenic gallstones or biliary sludge. The table position was marked and the gallbladder was identified sonographically. The right upper abdominal quadrant was prepped and draped in the usual sterile fashion, and a sterile drape was applied covering the operative field. Maximum barrier sterile technique with sterile gowns and gloves were used for the procedure. A timeout was performed prior to the initiation of the procedure. Local anesthesia was provided with 1% lidocaine with epinephrine. Ultrasound scanning of the right upper quadrant demonstrates a markedly dilated gallbladder with significant amount pericholecystic fluid. Of note, the patient reported pain with ultrasound imaging over the gallbladder. Utilizing a transhepatic approach, an 18 gauge needle was advanced into the gallbladder under direct ultrasound guidance. An ultrasound image was saved for documentation purposes. A short Amplatz wire was coiled within the gallbladder lumen. Appropriate positioning was confirmed with CT imaging. The track was dilated allowing placement of a 10 French percutaneous drainage catheter with end coiled and locked within the gallbladder lumen. At this point, approximately 80 cc of bile was aspirated from the gallbladder. A small amount of aspirated bile was capped sent to laboratory for analysis. The cholecystostomy tube was flushed with a small amount of saline and connected to a gravity bag. Obscured place within interrupted suture and a  Stat Lock device. A dressing was applied. The patient tolerated the procedure well without immediate postprocedural complication. IMPRESSION: Successful ultrasound and CT guided placement of a 10.2 French cholecystostomy tube. A small amount of aspirated bile was sent to the laboratory for analysis. Electronically Signed   By: Sandi Mariscal M.D.   On: 06/28/2020 11:41   CT ABDOMEN PELVIS W CONTRAST  Result Date: 06/27/2020 CLINICAL DATA:  Elevated LFTs EXAM: CT ABDOMEN AND PELVIS WITH CONTRAST TECHNIQUE: Multidetector CT imaging of the abdomen and pelvis was performed using the standard protocol following bolus administration of intravenous contrast. CONTRAST:  80mL OMNIPAQUE IOHEXOL 300 MG/ML  SOLN COMPARISON:  Ultrasound from earlier in the same day, CT from 03/27/2016. FINDINGS: Lower chest: No acute abnormality. Hepatobiliary: Fatty infiltration of the liver is noted. The gallbladder is well distended with multiple dependent gallstones. Mild pericholecystic inflammatory changes are noted similar to that seen on prior ultrasound. No biliary ductal dilatation is seen. Pancreas: Unremarkable. No pancreatic ductal dilatation or surrounding inflammatory changes. Spleen: Normal in size without focal abnormality. Adrenals/Urinary Tract: Adrenal glands are within normal limits. Kidneys demonstrate a normal  enhancement pattern bilaterally. No renal calculi or obstructive changes are seen. Delayed images demonstrate no significant excretion although this is expected given the patient's poor renal function. Bladder is partially distended. Stomach/Bowel: No obstructive or inflammatory changes of the colon are seen. The appendix is within normal limits. Small bowel shows some postsurgical changes. No obstructive changes are noted. Postsurgical changes are noted in normal stomach as well consistent with prior bariatric surgery. Vascular/Lymphatic: Aortic atherosclerosis. No enlarged abdominal or pelvic lymph nodes.  Reproductive: Uterus is well visualized and demonstrates a central hypodense mass lesion. This likely represents a degenerating fibroid given the more diffuse enhancement on prior CT examination. Other: No abdominal wall hernia or abnormality. No abdominopelvic ascites. Musculoskeletal: Degenerative changes of lumbar spine are noted. IMPRESSION: Dilated gallbladder with wall thickening and pericholecystic inflammatory change in cholelithiasis similar to that seen on prior ultrasound examination. Again HIDA scan may be helpful to assess for gallbladder function/cystic duct patency. Hypodense lesion within the uterus which measures approximately 3.3 cm. This likely represents a degenerating fibroid given the findings on prior CT. Chronic changes as described above. Electronically Signed   By: Inez Catalina M.D.   On: 06/27/2020 23:22   MR 3D Recon At Scanner  Result Date: 06/29/2020 CLINICAL DATA:  59 year old female with history of jaundice. Cholelithiasis. EXAM: MRI ABDOMEN WITHOUT AND WITH CONTRAST (INCLUDING MRCP) TECHNIQUE: Multiplanar multisequence MR imaging of the abdomen was performed both before and after the administration of intravenous contrast. Heavily T2-weighted images of the biliary and pancreatic ducts were obtained, and three-dimensional MRCP images were rendered by post processing. CONTRAST:  66mL GADAVIST GADOBUTROL 1 MMOL/ML IV SOLN COMPARISON:  No prior abdominal MRI. CT the abdomen and pelvis 06/27/2020. FINDINGS: Lower chest: Small right pleural effusion lying dependently with areas of probable passive atelectasis in the right lower lobe. Mild cardiomegaly. Hepatobiliary: In the periphery of segment 8 of the liver (axial image 25 of series 28) there is a 1.4 x 0.8 cm hypovascular region which is mildly T1 hypointense, mildly T2 hyperintense, and associated with small filling defects in the proximal aspect of the middle hepatic vein (axial image 21 of series 28). No other hepatic lesions are  noted. No intra or extrahepatic biliary ductal dilatation noted on MRCP images. However, there is mild diffuse periportal edema. Interval placement of percutaneous cholecystostomy tube which resides within a decompressed gallbladder. Gallbladder wall appears mildly thickened but severely edematous. Common bile duct measures 3 mm in the porta hepatis. No filling defect in the common bile duct to suggest choledocholithiasis. Pancreas: No pancreatic mass. No pancreatic ductal dilatation. No pancreatic or peripancreatic fluid collections. Trace amount of peripancreatic fluid. Spleen:  Unremarkable. Adrenals/Urinary Tract: Subcentimeter T1 hypointense, T2 hyperintense, nonenhancing lesions in both kidneys, compatible with simple cysts. Small amount of perinephric fluid bilaterally. No hydroureteronephrosis in the visualized portions of the abdomen. Bilateral adrenal glands are normal in appearance. Stomach/Bowel: Unremarkable. Vascular/Lymphatic: Aortic atherosclerosis, without evidence of aneurysm in the abdominal vasculature. No lymphadenopathy noted in the abdomen. Other: Small volume of ascites, most evident in the right upper quadrant, particularly adjacent to the gallbladder. Perinephric fluid bilaterally, as well as a small amount of peripancreatic fluid in the retroperitoneum. Musculoskeletal: No aggressive appearing osseous lesions are noted in the visualized portions of the skeleton. IMPRESSION: 1. Interval percutaneous cholecystostomy with decompression of the gallbladder. Previously noted gallstones are not confidently identified on today's examination. Gallbladder wall appears thickened and severely edematous with persistent pericholecystic fluid. 2. No choledocholithiasis or signs of biliary tract  obstruction. 3. Periportal edema in the liver. 4. Edema in the retroperitoneum including both peripancreatic and perinephric fluid bilaterally. 5. Small right pleural effusion lying dependently with some  associated passive subsegmental atelectasis in the right lower lobe. Electronically Signed   By: Vinnie Langton M.D.   On: 06/29/2020 06:15   DG Chest Port 1 View  Result Date: 06/28/2020 CLINICAL DATA:  Respiratory failure. EXAM: PORTABLE CHEST 1 VIEW COMPARISON:  Chest x-ray 06/27/2020. FINDINGS: Heart size stable. Low lung volumes. Very mild bilateral interstitial prominence noted. Mild pneumonitis can not be excluded. Persistent mild bibasilar subsegmental atelectasis. No pleural effusion or pneumothorax. IMPRESSION: Low lung volumes. Very mild bilateral interstitial prominence noted. Mild pneumonitis can not be excluded. Persistent mild bibasilar subsegmental atelectasis. Electronically Signed   By: Marcello Moores  Register   On: 06/28/2020 07:56   DG Chest Port 1 View  Result Date: 06/27/2020 CLINICAL DATA:  Possible sepsis EXAM: PORTABLE CHEST 1 VIEW COMPARISON:  None. FINDINGS: Minimal atelectasis left base. No consolidation or effusion. Normal heart size. Aortic atherosclerosis. No pneumothorax. IMPRESSION: No active disease. Electronically Signed   By: Donavan Foil M.D.   On: 06/27/2020 19:00   MR ABDOMEN MRCP W WO CONTAST  Result Date: 06/29/2020 CLINICAL DATA:  59 year old female with history of jaundice. Cholelithiasis. EXAM: MRI ABDOMEN WITHOUT AND WITH CONTRAST (INCLUDING MRCP) TECHNIQUE: Multiplanar multisequence MR imaging of the abdomen was performed both before and after the administration of intravenous contrast. Heavily T2-weighted images of the biliary and pancreatic ducts were obtained, and three-dimensional MRCP images were rendered by post processing. CONTRAST:  74mL GADAVIST GADOBUTROL 1 MMOL/ML IV SOLN COMPARISON:  No prior abdominal MRI. CT the abdomen and pelvis 06/27/2020. FINDINGS: Lower chest: Small right pleural effusion lying dependently with areas of probable passive atelectasis in the right lower lobe. Mild cardiomegaly. Hepatobiliary: In the periphery of segment 8 of the  liver (axial image 25 of series 28) there is a 1.4 x 0.8 cm hypovascular region which is mildly T1 hypointense, mildly T2 hyperintense, and associated with small filling defects in the proximal aspect of the middle hepatic vein (axial image 21 of series 28). No other hepatic lesions are noted. No intra or extrahepatic biliary ductal dilatation noted on MRCP images. However, there is mild diffuse periportal edema. Interval placement of percutaneous cholecystostomy tube which resides within a decompressed gallbladder. Gallbladder wall appears mildly thickened but severely edematous. Common bile duct measures 3 mm in the porta hepatis. No filling defect in the common bile duct to suggest choledocholithiasis. Pancreas: No pancreatic mass. No pancreatic ductal dilatation. No pancreatic or peripancreatic fluid collections. Trace amount of peripancreatic fluid. Spleen:  Unremarkable. Adrenals/Urinary Tract: Subcentimeter T1 hypointense, T2 hyperintense, nonenhancing lesions in both kidneys, compatible with simple cysts. Small amount of perinephric fluid bilaterally. No hydroureteronephrosis in the visualized portions of the abdomen. Bilateral adrenal glands are normal in appearance. Stomach/Bowel: Unremarkable. Vascular/Lymphatic: Aortic atherosclerosis, without evidence of aneurysm in the abdominal vasculature. No lymphadenopathy noted in the abdomen. Other: Small volume of ascites, most evident in the right upper quadrant, particularly adjacent to the gallbladder. Perinephric fluid bilaterally, as well as a small amount of peripancreatic fluid in the retroperitoneum. Musculoskeletal: No aggressive appearing osseous lesions are noted in the visualized portions of the skeleton. IMPRESSION: 1. Interval percutaneous cholecystostomy with decompression of the gallbladder. Previously noted gallstones are not confidently identified on today's examination. Gallbladder wall appears thickened and severely edematous with persistent  pericholecystic fluid. 2. No choledocholithiasis or signs of biliary tract obstruction. 3.  Periportal edema in the liver. 4. Edema in the retroperitoneum including both peripancreatic and perinephric fluid bilaterally. 5. Small right pleural effusion lying dependently with some associated passive subsegmental atelectasis in the right lower lobe. Electronically Signed   By: Vinnie Langton M.D.   On: 06/29/2020 06:15   ECHOCARDIOGRAM COMPLETE  Result Date: 07/02/2020    ECHOCARDIOGRAM REPORT   Patient Name:   Carrie Hardy Date of Exam: 07/02/2020 Medical Rec #:  [id number]         Height:       65.0 in Accession #:    [id number]        Weight:       215.2 lb Date of Birth:  1960-10-24         BSA:          2.041 m Patient Age:    51 years          BP:           106/81 mmHg Patient Gender: F                 HR:           79 bpm. Exam Location:  [facility] Procedure: 2D Echo, Cardiac Doppler and Color Doppler Indications:     Cardiomyopathy-unspecified 425.9  History:         Patient has no prior history of Echocardiogram examinations.                  Risk Factors:Hypertension.  Sonographer:     Sherrie Sport RDCS (AE) Referring Phys:  [id number] Gyasi Hazzard Veda Canning Diagnosing Phys: Nelva Bush MD IMPRESSIONS  1. Left ventricular ejection fraction, by estimation, is 35 to 40%. The left ventricle has moderately decreased function. The left ventricle demonstrates global hypokinesis. There is mild left ventricular hypertrophy. Left ventricular diastolic parameters are consistent with Grade I diastolic dysfunction (impaired relaxation).  2. Right ventricular systolic function is low normal. The right ventricular size is normal. There is normal pulmonary artery systolic pressure.  3. Left atrial size was severely dilated.  4. Right atrial size was mildly dilated.  5. The mitral valve is normal in structure. Mild to moderate mitral valve regurgitation. No evidence of mitral stenosis.  6. The aortic valve is tricuspid. Aortic  valve regurgitation is mild. No aortic stenosis is present. FINDINGS  Left Ventricle: Left ventricular ejection fraction, by estimation, is 35 to 40%. The left ventricle has moderately decreased function. The left ventricle demonstrates global hypokinesis. The left ventricular internal cavity size was normal in size. There is mild left ventricular hypertrophy. Left ventricular diastolic parameters are consistent with Grade I diastolic dysfunction (impaired relaxation). Right Ventricle: The right ventricular size is normal. No increase in right ventricular wall thickness. Right ventricular systolic function is low normal. There is normal pulmonary artery systolic pressure. Left Atrium: Left atrial size was severely dilated. Right Atrium: Right atrial size was mildly dilated. Pericardium: There is no evidence of pericardial effusion. Mitral Valve: The mitral valve is normal in structure. Mild mitral annular calcification. Mild to moderate mitral valve regurgitation. No evidence of mitral valve stenosis. Tricuspid Valve: The tricuspid valve is normal in structure. Tricuspid valve regurgitation is mild. Aortic Valve: The aortic valve is tricuspid. Aortic valve regurgitation is mild. No aortic stenosis is present. Aortic valve mean gradient measures 3.0 mmHg. Aortic valve peak gradient measures 5.3 mmHg. Aortic valve area, by VTI measures 2.43 cm. Pulmonic Valve: The pulmonic valve was grossly normal. Pulmonic  valve regurgitation is not visualized. Aorta: The aortic root is normal in size and structure. Pulmonary Artery: The pulmonary artery is of normal size. Venous: The inferior vena cava was not well visualized. IAS/Shunts: The interatrial septum was not well visualized.  LEFT VENTRICLE PLAX 2D LVIDd:         4.86 cm      Diastology LVIDs:         3.93 cm      LV e' medial:    5.44 cm/s LV PW:         1.06 cm      LV E/e' medial:  11.3 LV IVS:        0.93 cm      LV e' lateral:   9.14 cm/s LVOT diam:     2.00 cm       LV E/e' lateral: 6.7 LV SV:         54 LV SV Index:   26 LVOT Area:     3.14 cm  LV Volumes (MOD) LV vol d, MOD A2C: 106.0 ml LV vol d, MOD A4C: 98.9 ml LV vol s, MOD A2C: 62.3 ml LV vol s, MOD A4C: 59.4 ml LV SV MOD A2C:     43.7 ml LV SV MOD A4C:     98.9 ml LV SV MOD BP:      39.8 ml RIGHT VENTRICLE RV Basal diam:  3.30 cm RV S prime:     10.40 cm/s TAPSE (M-mode): 2.2 cm LEFT ATRIUM              Index       RIGHT ATRIUM           Index LA diam:        4.90 cm  2.40 cm/m  RA Area:     16.90 cm LA Vol (A2C):   151.0 ml 74.00 ml/m RA Volume:   38.80 ml  19.01 ml/m LA Vol (A4C):   112.0 ml 54.89 ml/m LA Biplane Vol: 133.0 ml 65.18 ml/m  AORTIC VALVE                   PULMONIC VALVE AV Area (Vmax):    2.05 cm    PV Vmax:        0.63 m/s AV Area (Vmean):   2.14 cm    PV Peak grad:   1.6 mmHg AV Area (VTI):     2.43 cm    RVOT Peak grad: 2 mmHg AV Vmax:           115.00 cm/s AV Vmean:          76.600 cm/s AV VTI:            0.221 m AV Peak Grad:      5.3 mmHg AV Mean Grad:      3.0 mmHg LVOT Vmax:         75.10 cm/s LVOT Vmean:        52.300 cm/s LVOT VTI:          0.171 m LVOT/AV VTI ratio: 0.77  AORTA Ao Root diam: 3.20 cm MITRAL VALVE               TRICUSPID VALVE MV Area (PHT): 3.83 cm    TR Peak grad:   26.4 mmHg MV Decel Time: 198 msec    TR Vmax:        257.00 cm/s MV E velocity: 61.30 cm/s MV A velocity: 78.80  cm/s  SHUNTS MV E/A ratio:  0.78        Systemic VTI:  0.17 m                            Systemic Diam: 2.00 cm Nelva Bush MD Electronically signed by Nelva Bush MD Signature Date/Time: 07/02/2020/2:48:34 PM    Final    CT IMAGE GUIDED DRAINAGE BY PERCUTANEOUS CATHETER  Result Date: 06/28/2020 INDICATION: Acute cholecystitis. Poor operative candidate. Please perform image guided cholecystostomy tube placement for infection source control purposes. EXAM: ULTRASOUND AND FLUOROSCOPIC-GUIDED CHOLECYSTOSTOMY TUBE PLACEMENT COMPARISON:  CT abdomen and pelvis-06/27/2020 MEDICATIONS: The  patient is currently admitted to the hospital and on intravenous antibiotics. Antibiotics were administered within an appropriate time frame prior to skin puncture. ANESTHESIA/SEDATION: Moderate (conscious) sedation was employed during this procedure. A total of Versed 0.5 mg and Fentanyl 25 mcg was administered intravenously. Moderate Sedation Time: 15 minutes. The patient's level of consciousness and vital signs were monitored continuously by radiology nursing throughout the procedure under my direct supervision. CONTRAST:  None FLUOROSCOPY TIME:  Not provided COMPLICATIONS: None immediate. PROCEDURE: Informed written consent was obtained from the patient after a discussion of the risks, benefits and alternatives to treatment. Questions regarding the procedure were encouraged and answered. A timeout was performed prior to the initiation of the procedure. Patient was placed supine on the CT gantry with CT imaging demonstrating distension of the gallbladder with rather extensive amount pericholecystic fluid and layering echogenic gallstones or biliary sludge. The table position was marked and the gallbladder was identified sonographically. The right upper abdominal quadrant was prepped and draped in the usual sterile fashion, and a sterile drape was applied covering the operative field. Maximum barrier sterile technique with sterile gowns and gloves were used for the procedure. A timeout was performed prior to the initiation of the procedure. Local anesthesia was provided with 1% lidocaine with epinephrine. Ultrasound scanning of the right upper quadrant demonstrates a markedly dilated gallbladder with significant amount pericholecystic fluid. Of note, the patient reported pain with ultrasound imaging over the gallbladder. Utilizing a transhepatic approach, an 18 gauge needle was advanced into the gallbladder under direct ultrasound guidance. An ultrasound image was saved for documentation purposes. A short Amplatz  wire was coiled within the gallbladder lumen. Appropriate positioning was confirmed with CT imaging. The track was dilated allowing placement of a 10 French percutaneous drainage catheter with end coiled and locked within the gallbladder lumen. At this point, approximately 80 cc of bile was aspirated from the gallbladder. A small amount of aspirated bile was capped sent to laboratory for analysis. The cholecystostomy tube was flushed with a small amount of saline and connected to a gravity bag. Obscured place within interrupted suture and a Stat Lock device. A dressing was applied. The patient tolerated the procedure well without immediate postprocedural complication. IMPRESSION: Successful ultrasound and CT guided placement of a 10.2 French cholecystostomy tube. A small amount of aspirated bile was sent to the laboratory for analysis. Electronically Signed   By: Sandi Mariscal M.D.   On: 06/28/2020 11:41   US ABDOMEN LIMITED RUQ (LIVER/GB)  Result Date: 06/27/2020 CLINICAL DATA:  Elevated LFT EXAM: ULTRASOUND ABDOMEN LIMITED RIGHT UPPER QUADRANT COMPARISON:  CT 03/27/2016 FINDINGS: Gallbladder: Multiple shadowing stones in the gallbladder. Increased gallbladder wall thickness measuring 7.3 mm. Negative sonographic Murphy. Edematous appearing gallbladder wall with small amount of pericholecystic fluid. Common bile duct: Diameter: 3.6 mm Liver: Liver is  echogenic. Previously described liver mass is not seen sonographically. Portal vein is patent on color Doppler imaging with normal direction of blood flow towards the liver. Other: Small amount of free fluid in the right upper quadrant IMPRESSION: 1. Cholelithiasis with gallbladder wall thickening/edema and small amount of pericholecystic fluid but negative sonographic Percell Miller, concern is raised for cholecystitis despite negative sonographic Murphy. Correlation with nuclear medicine hepatobiliary imaging could be obtained for further evaluation 2. Diffusely echogenic  liver consistent with steatosis and or hepatocellular disease 3. Small amount of right upper quadrant ascites Electronically Signed   By: Donavan Foil M.D.   On: 06/27/2020 22:51    Consults: Treatment Team:  Jules Husbands, MD Liana Gerold, MD   Subjective:    Overnight Issues: No overt complaint today, still complaining of being swollen all over.  ROS: A 10 point review of systems was performed and it is as noted above otherwise negative.    Objective:  Vital signs for last 24 hours: Temp:  [97.7 F (36.5 C)-98 F (36.7 C)] 98 F (36.7 C) (10/25 0800) Pulse Rate:  [75-91] 81 (10/25 1000) Resp:  [12-22] 19 (10/25 1000) BP: (107-137)/(73-108) 132/88 (10/25 1000) SpO2:  [92 %-98 %] 94 % (10/25 1000)  Hemodynamic parameters for last 24 hours:    Intake/Output from previous day: 10/24 0701 - 10/25 0700 In: 1203.5 [I.V.:1093.5; IV Piggyback:100] Out: 755 [Urine:475; Drains:280]  Intake/Output this shift: Total I/O In: 555.9 [P.O.:360; I.V.:195.9] Out: 45 [Drains:45]  Vent settings for last 24 hours:    Physical Exam:  GENERAL: Obese woman, no acute respiratory distress.  Awake, alert. HEAD: Normocephalic, atraumatic.  EYES: Pupils equal, round, reactive to light.  No scleral icterus.  MOUTH: Oral mucosa moist.  No thrush. NECK: Supple. No thyromegaly. Trachea midline. No JVD.  No adenopathy. PULMONARY: Good air entry bilaterally.  No adventitious sounds. CARDIOVASCULAR: S1 and S2. Regular rate and rhythm.  ABDOMEN: Obese, soft, no tenderness.  Percutaneous cholecystostomy tube right upper quadrant with bilious output MUSCULOSKELETAL: No joint deformity, no clubbing, anasarca 1-2+  NEUROLOGIC: Awake, alert, no overt focal deficit.  Fluent speech. SKIN: Intact,warm,dry. PSYCH: Mood and behavior appropriate.  Assessment/Plan:   Severe Sepsis Due to cholecystitis Lactic Acidosis, resolved Continue supportive care Right upper quadrant drain placed No  surgical intervention Continue antibiotics Pressors weaned off On stress dose steroids, day 5/7 General surgery following, signed off today  Anion Gap Metabolic Acidosis Acute Renal Failure Lactic acidosis resolved Metabolic acidosis likely due to renal failure Decrease IV fluids Continues to decline Foley Difficult to assess I's and O's Nephrology following, appreciate input Patient does not want dialysis if this where necessary  Transaminitis due to cholecystitis  Complicated by chronic hepatic steatosis Transaminases continue on downward trend Biliary drain for cholecystitis in place Continue supportive care  ID E. coli and Klebsiella bacteremia Source is gallbladder Percutaneous biliary drain Ceftriaxone  HTN HLD home medications: amlodipine and metoprolol succinate on hold in the setting of hypotension Will restart home lipitor once patient is tolerating PO meds better.     LOS: 6 days   Additional comments: Patient updated, transfer out of SDU day with transition to [employer].  Will obtain Palliative Care consult to establish goals of care as she is declining aggressive modes of therapy.  Critical Care Total Time*: Follow-up level 2  C. Derrill Kay, MD [facility] 07/03/2020  *Care during the described time interval was provided by me and/or other providers on the critical care team.  I have reviewed this patient's available data, including medical history, events of note, physical examination and test results as part of my evaluation.  **This note was dictated using voice recognition software/Dragon.  Despite best efforts to proofread, errors can occur which can change the meaning.  Any change was purely unintentional.

## 2020-07-04 DIAGNOSIS — N170 Acute kidney failure with tubular necrosis: Secondary | ICD-10-CM

## 2020-07-04 DIAGNOSIS — Z515 Encounter for palliative care: Secondary | ICD-10-CM

## 2020-07-04 DIAGNOSIS — E785 Hyperlipidemia, unspecified: Secondary | ICD-10-CM

## 2020-07-04 DIAGNOSIS — Z7189 Other specified counseling: Secondary | ICD-10-CM

## 2020-07-04 LAB — CBC WITH DIFFERENTIAL/PLATELET
Abs Immature Granulocytes: 0.34 10*3/uL — ABNORMAL HIGH (ref 0.00–0.07)
Basophils Absolute: 0 10*3/uL (ref 0.0–0.1)
Basophils Relative: 0 %
Eosinophils Absolute: 0 10*3/uL (ref 0.0–0.5)
Eosinophils Relative: 0 %
HCT: 31 % — ABNORMAL LOW (ref 36.0–46.0)
Hemoglobin: 11.2 g/dL — ABNORMAL LOW (ref 12.0–15.0)
Immature Granulocytes: 3 %
Lymphocytes Relative: 5 %
Lymphs Abs: 0.6 10*3/uL — ABNORMAL LOW (ref 0.7–4.0)
MCH: 36 pg — ABNORMAL HIGH (ref 26.0–34.0)
MCHC: 36.1 g/dL — ABNORMAL HIGH (ref 30.0–36.0)
MCV: 99.7 fL (ref 80.0–100.0)
Monocytes Absolute: 1.2 10*3/uL — ABNORMAL HIGH (ref 0.1–1.0)
Monocytes Relative: 9 %
Neutro Abs: 10.6 10*3/uL — ABNORMAL HIGH (ref 1.7–7.7)
Neutrophils Relative %: 83 %
Platelets: 214 10*3/uL (ref 150–400)
RBC: 3.11 MIL/uL — ABNORMAL LOW (ref 3.87–5.11)
RDW: 14.8 % (ref 11.5–15.5)
WBC: 12.8 10*3/uL — ABNORMAL HIGH (ref 4.0–10.5)
nRBC: 0.6 % — ABNORMAL HIGH (ref 0.0–0.2)

## 2020-07-04 LAB — MAGNESIUM: Magnesium: 1.9 mg/dL (ref 1.7–2.4)

## 2020-07-04 LAB — RENAL FUNCTION PANEL
Albumin: 2.4 g/dL — ABNORMAL LOW (ref 3.5–5.0)
Anion gap: 12 (ref 5–15)
BUN: 52 mg/dL — ABNORMAL HIGH (ref 8–23)
CO2: 17 mmol/L — ABNORMAL LOW (ref 22–32)
Calcium: 8 mg/dL — ABNORMAL LOW (ref 8.9–10.3)
Chloride: 105 mmol/L (ref 98–111)
Creatinine, Ser: 3.3 mg/dL — ABNORMAL HIGH (ref 0.44–1.00)
GFR, Estimated: 15 mL/min — ABNORMAL LOW (ref >60)
Glucose, Bld: 122 mg/dL — ABNORMAL HIGH (ref 70–99)
Phosphorus: 5.9 mg/dL — ABNORMAL HIGH (ref 2.5–4.6)
Potassium: 3.6 mmol/L (ref 3.5–5.1)
Sodium: 134 mmol/L — ABNORMAL LOW (ref 135–145)

## 2020-07-04 LAB — PROCALCITONIN: Procalcitonin: 15.27 ng/mL

## 2020-07-04 LAB — GLUCOSE, CAPILLARY
Glucose-Capillary: 110 mg/dL — ABNORMAL HIGH (ref 70–99)
Glucose-Capillary: 151 mg/dL — ABNORMAL HIGH (ref 70–99)
Glucose-Capillary: 135 mg/dL — ABNORMAL HIGH (ref 70–99)

## 2020-07-04 MED ORDER — ENSURE MAX PROTEIN PO LIQD
11.0000 [oz_av] | Freq: Two times a day (BID) | ORAL | Status: DC
  Administered 2020-07-04 – 2020-07-06 (×4): 11 [oz_av] via ORAL
  Filled 2020-07-04: qty 330

## 2020-07-04 MED ORDER — CHLORHEXIDINE GLUCONATE CLOTH 2 % EX PADS
6.0000 | MEDICATED_PAD | Freq: Every day | CUTANEOUS | Status: DC
  Administered 2020-07-04 – 2020-07-07 (×4): 6 via TOPICAL

## 2020-07-04 MED ORDER — ADULT MULTIVITAMIN W/MINERALS CH
1.0000 | ORAL_TABLET | Freq: Every day | ORAL | Status: DC
  Administered 2020-07-05 – 2020-07-07 (×3): 1 via ORAL
  Filled 2020-07-04 (×3): qty 1

## 2020-07-04 MED ORDER — HYDROCORTISONE NA SUCCINATE PF 100 MG IJ SOLR
50.0000 mg | Freq: Two times a day (BID) | INTRAMUSCULAR | Status: AC
  Administered 2020-07-04: 50 mg via INTRAVENOUS
  Filled 2020-07-04: qty 1

## 2020-07-04 MED ORDER — HYDROCODONE-ACETAMINOPHEN 5-325 MG PO TABS
1.0000 | ORAL_TABLET | Freq: Four times a day (QID) | ORAL | Status: DC | PRN
  Administered 2020-07-07 (×2): 1 via ORAL
  Filled 2020-07-04 (×2): qty 1

## 2020-07-04 MED ORDER — HEPARIN SODIUM (PORCINE) 5000 UNIT/ML IJ SOLN
5000.0000 [IU] | Freq: Three times a day (TID) | INTRAMUSCULAR | Status: DC
  Administered 2020-07-04 – 2020-07-07 (×8): 5000 [IU] via SUBCUTANEOUS
  Filled 2020-07-04 (×8): qty 1

## 2020-07-04 MED ORDER — PANTOPRAZOLE SODIUM 40 MG PO TBEC
40.0000 mg | DELAYED_RELEASE_TABLET | Freq: Every day | ORAL | Status: DC
  Administered 2020-07-04 – 2020-07-07 (×4): 40 mg via ORAL
  Filled 2020-07-04 (×4): qty 1

## 2020-07-04 MED ORDER — MORPHINE SULFATE (PF) 2 MG/ML IV SOLN: 1.0000 mg | Freq: Four times a day (QID) | INTRAVENOUS | Status: DC | PRN

## 2020-07-04 NOTE — Care Plan (Signed)
Tele called and made aware that order has been d/c at this time.

## 2020-07-04 NOTE — Plan of Care (Signed)
Pt Axox4. Calm and cooperative and able to voice her needs. Drain to RLQ noted with bile content. Pt denied pain all night. Purwick in place and collecting adequate amount of clear amber urine. Pt tolerates clear liquid diet well. Vitals stable. Safety measures in place. Will continue to monitor.  Problem: Education: Goal: Knowledge of General Education information will improve Description: Including pain rating scale, medication(s)/side effects and non-pharmacologic comfort measures Outcome: Progressing   Problem: Health Behavior/Discharge Planning: Goal: Ability to manage health-related needs will improve Outcome: Progressing   Problem: Clinical Measurements: Goal: Ability to maintain clinical measurements within normal limits will improve Outcome: Progressing Goal: Will remain free from infection Outcome: Progressing Goal: Diagnostic test results will improve Outcome: Progressing Goal: Respiratory complications will improve Outcome: Progressing Goal: Cardiovascular complication will be avoided Outcome: Progressing   Problem: Activity: Goal: Risk for activity intolerance will decrease Outcome: Progressing   Problem: Nutrition: Goal: Adequate nutrition will be maintained Outcome: Progressing   Problem: Coping: Goal: Level of anxiety will decrease Outcome: Progressing   Problem: Elimination: Goal: Will not experience complications related to bowel motility Outcome: Progressing Goal: Will not experience complications related to urinary retention Outcome: Progressing   Problem: Pain Managment: Goal: General experience of comfort will improve Outcome: Progressing   Problem: Safety: Goal: Ability to remain free from injury will improve Outcome: Progressing   Problem: Skin Integrity: Goal: Risk for impaired skin integrity will decrease Outcome: Progressing

## 2020-07-04 NOTE — Progress Notes (Addendum)
Initial Nutrition Assessment  DOCUMENTATION CODES:   Not applicable  INTERVENTION:   Ensure Max protein supplement BID, each supplement provides 150kcal and 30g of protein.  MVI daily   Pt at high refeed risk; recommend monitor potassium, magnesium and phosphorus labs daily until stable  NUTRITION DIAGNOSIS:   Inadequate oral intake related to acute illness as evidenced by other (comment) (pt on NPO/liquid diet since admit).  GOAL:   Patient will meet greater than or equal to 90% of their needs  MONITOR:   PO intake, Supplement acceptance, Diet advancement, Weight trends, Labs, Skin, I & O's  REASON FOR ASSESSMENT:   NPO/Clear Liquid Diet    ASSESSMENT:   59 y.o. female with past medical history of hypertension, hyperlipidemia, nonalcoholic fatty liver disease who presented with cholelithiasis and gallbladder wall thickening consistent with cholecystitis.   Pt s/p CT guided cholecystostomy tube 10/20  Met with pt in room today. Pt reports good appetite and oral intake at baseline but reports that her appetite was decreased for several days pta. Pt reports that her appetite is improving in hospital. Pt has been on NPO/clear liquid diet since admit and is now without adequate nutrition for 7 days. Pt reports eating 50-75% of her clear liquids. Pt advanced to full liquids today. RD will add supplements to help pt meet her estimated needs (prefers strawberry). Pt is at high refeed risk. Per chart, pt is currently noted to be ~58lbs above her UBW of 178lbs. Pt +14.5L on her I & O's. Drain with 370m output. UOP 7564m Pt is noted to have edema. MD notified of current weight status.    Medications reviewed and include: heparin, protonix, ceftriaxone, 5% dextrose w/ Na Bicarb @70ml /hr  Labs reviewed: Na 134(L), BUN 52(H), creat 3.30(H), P 5.9(H), Mg 1.9 wnl Wbc- 12.8(H)  NUTRITION - FOCUSED PHYSICAL EXAM:    Most Recent Value  Orbital Region No depletion  Upper Arm Region No  depletion  Thoracic and Lumbar Region No depletion  Buccal Region No depletion  Temple Region No depletion  Clavicle Bone Region No depletion  Clavicle and Acromion Bone Region No depletion  Scapular Bone Region No depletion  Dorsal Hand No depletion  Patellar Region No depletion  Anterior Thigh Region No depletion  Posterior Calf Region No depletion  Edema (RD Assessment) Moderate  Hair Reviewed  Eyes Reviewed  Mouth Reviewed  Skin Reviewed  Nails Reviewed     Diet Order:   Diet Order            Diet full liquid Room service appropriate? Yes; Fluid consistency: Thin  Diet effective now                EDUCATION NEEDS:   Education needs have been addressed  Skin:  Skin Assessment: Reviewed RN Assessment (ecchymosis, skin tear abdomen)  Last BM:  10/24- type 6  Height:   Ht Readings from Last 1 Encounters:  06/28/20 5' 5"  (1.651 m)    Weight:   Wt Readings from Last 1 Encounters:  07/04/20 107 kg    Ideal Body Weight:  56.8 kg  BMI:  Body mass index is 39.25 kg/m.  Estimated Nutritional Needs:   Kcal:  1700-1900kcal/day  Protein:  85-95g/day  Fluid:  1.7-1.9L/day  CaKoleen DistanceS, RD, LDN Please refer to [facility] [place] RD and/or RD on-call/weekend/after hours pager

## 2020-07-04 NOTE — Consult Note (Signed)
Consultation Note Date: 07/04/2020   Patient Name: Carrie Hardy  DOB: [birth year]  MRN: [id number]  Age / Sex: 59 y.o., female  PCP: Carrie Jericho, NP Referring Physician: Fritzi Mandes, MD  Reason for Consultation: Establishing goals of care  HPI/Patient Profile: 59 yo female presenting to the ED with severe sepsis due to cholecystitis requiring vasopressors.  Clinical Assessment and Goals of Care: Patient is resting in bed. No family at bedside. She states she has been married 37 years. She has 2 children.  Functionally, prior to this hospitalization she was doing well and fully independent. She enjoys decorating. She states she was seen at [facility] for concerns over her liver, and was ultimately told she has a fatty liver.    We discussed her diagnosis, prognosis, and GOC.  A detailed discussion was had today regarding advanced directives.  Concepts specific to code status, dialysis, IV antibiotics and rehospitalization were discussed. Values and goals of care important to patient and family were attempted to be elicited.  Discussed limitations of medical interventions to prolong quality of life in some situations and discussed the concept of human mortality.  Patient discusses her drain. When asked about dialysis, Carrie Hardy states she wants to give her body time to adjust and to see what it does. She states if she must have dialysis to live, she will have it. Upon further [place] discussion, and inquiry of possible boundaries to care, she states she wants all care possible to live as long as possible irregardless of pain or suffering. She tells me she would like resuscitative efforts to restart her heart if it stops, without regard to meaningful recovery, pain, or suffering.        SUMMARY OF RECOMMENDATIONS   Full code/full scope.    Prognosis:   Unable to determine      Primary  Diagnoses: Present on Admission: **None**   I have reviewed the medical record, interviewed the patient and family, and examined the patient. The following aspects are pertinent.  Past Medical History:  Diagnosis Date   Hypertension    Social History   Socioeconomic History   Marital status: Married    Spouse name: Not on file   Number of children: Not on file   Years of education: Not on file   Highest education level: Not on file  Occupational History   Not on file  Tobacco Use   Smoking status: Never Smoker   Smokeless tobacco: Never Used  Vaping Use   Vaping Use: Never assessed  Substance and Sexual Activity   Alcohol use: Not Currently   Drug use: Not Currently   Sexual activity: Not on file  Other Topics Concern   Not on file  Social History Narrative   Not on file   Social Determinants of Health   Financial Resource Strain:    Difficulty of Paying Living Expenses: Not on file  Food Insecurity:    Worried About [place] in the Last Year: Not on file   Ran  Out of Food in the Last Year: Not on file  Transportation Needs:    Lack of Transportation (Medical): Not on file   Lack of Transportation (Non-Medical): Not on file  Physical Activity:    Days of Exercise per Week: Not on file   Minutes of Exercise per Session: Not on file  Stress:    Feeling of Stress : Not on file  Social Connections:    Frequency of Communication with Friends and Family: Not on file   Frequency of Social Gatherings with Friends and Family: Not on file   Attends Religious Services: Not on file   Active Member of Clubs or Organizations: Not on file   Attends Archivist Meetings: Not on file   Marital Status: Not on file   History reviewed. No pertinent family history. Scheduled Meds:  Chlorhexidine Gluconate Cloth  6 each Topical Q0600   heparin injection (subcutaneous)  5,000 Units Subcutaneous Q8H   hydrocortisone sod  succinate (SOLU-CORTEF) inj  50 mg Intravenous Q12H   [START ON 07/05/2020] multivitamin with minerals  1 tablet Oral Daily   pantoprazole  40 mg Oral Daily   Ensure Max Protein  11 oz Oral BID   sodium chloride flush  5 mL Intracatheter Q8H   Continuous Infusions:  sodium chloride Stopped (07/03/20 1547)   cefTRIAXone (ROCEPHIN)  IV 2 g (07/03/20 1718)   dextrose 5 % 1,000 mL with sodium bicarbonate 75 mEq infusion 70 mL/hr at 07/04/20 1112   PRN Meds:.docusate sodium, HYDROcodone-acetaminophen, loperamide, morphine injection, ondansetron (ZOFRAN) IV, polyethylene glycol Medications Prior to Admission:  Prior to Admission medications   Medication Sig Start Date End Date Taking? Authorizing Provider  amLODipine (NORVASC) 10 MG tablet Take 10 mg by mouth daily. 05/09/20  Yes [provider]  atorvastatin (LIPITOR) 40 MG tablet Take 40 mg by mouth at bedtime. 05/09/20  Yes [provider]  metoprolol succinate (TOPROL-XL) 50 MG 24 hr tablet Take 50 mg by mouth daily. 05/09/20  Yes [provider]   Allergies  Allergen Reactions   Tramadol Itching   Review of Systems  All other systems reviewed and are negative.   Physical Exam Pulmonary:     Effort: Pulmonary effort is normal.  Neurological:     Mental Status: She is alert.     Vital Signs: BP (!) 134/93 (BP Location: Left Arm)    Pulse 81    Temp (!) 97.4 F (36.3 C) (Oral)    Resp 20    Ht 5\' 5"  (1.651 m)    Wt 107 kg    SpO2 97%    BMI 39.25 kg/m  Pain Scale: 0-10   Pain Score: 0-No pain   SpO2: SpO2: 97 % O2 Device:SpO2: 97 % O2 Flow Rate: .O2 Flow Rate (L/min): 2 L/min  IO: Intake/output summary:   Intake/Output Summary (Last 24 hours) at 07/04/2020 1604 Last data filed at 07/04/2020 1300 Gross per 24 hour  Intake 1320 ml  Output 1830 ml  Net -510 ml    LBM: Last BM Date: 07/02/20 Baseline Weight: Weight: 81.6 kg Most recent weight: Weight: 107 kg     Palliative  Assessment/Data:     Time In: 3:30 Time Out: 4:15 Time Total: 45 min Greater than 50%  of this time was spent counseling and coordinating care related to the above assessment and plan.  Signed by: Carrie Gowda, NP   Please contact Palliative Medicine Team phone at [phone] for questions and concerns.  For individual  provider: See Shea Evans

## 2020-07-04 NOTE — Progress Notes (Signed)
[facility]  ROUNDING NOTE   Subjective:  Patient found sitting up in bed, denies worsening SOB,nausea or vomiting. Patient is on D5 with Sodium bicarbonate infusion 50 ml/hr. Recorded urine output for preceding 24 hours is 750 ml.  Objective:  Vital signs in last 24 hours:  Temp:  [97.4 F (36.3 C)-98.6 F (37 C)] 97.4 F (36.3 C) (10/26 1122) Pulse Rate:  [76-90] 81 (10/26 1122) Resp:  [14-18] 16 (10/26 0345) BP: (114-143)/(80-93) 134/93 (10/26 1122) SpO2:  [94 %-99 %] 97 % (10/26 1122) Weight:  [107 kg] 107 kg (10/26 0434)  Weight change:  Filed Weights   06/30/20 0419 07/01/20 0500 07/04/20 0434  Weight: 94.4 kg 97.6 kg 107 kg    Intake/Output: I/O last 3 completed shifts: In: 1998.8 [P.O.:480; I.V.:1201.5; Other:10; IV Piggyback:307.3] Out: 1630 [Urine:1075; Drains:555]   Intake/Output this shift:  Total I/O In: 800 [P.O.:800] Out: 820 [Urine:700; Drains:120]  Physical Exam: General:  No acute distress,sitiing up in bed  Head:   Moist oral mucosal membranes  Eyes:  Anicteric  Lungs:   Clear to auscultation, normal effort  Heart:  Regular, S1S2 no rubs or gallops  Abdomen:   Soft, nontender, non distended  Extremities:   Trace peripheral edema.  Neurologic:  Oriented x 3  Skin:  No acute lesions or rashes    Basic Metabolic Panel: Recent Labs  Lab 06/28/20 0336 06/28/20 0336 06/29/20 0342 06/29/20 0342 06/30/20 0415 06/30/20 0415 07/01/20 0625 07/01/20 0625 07/02/20 0616 07/03/20 0500 07/04/20 0609  NA 137   < > 136   < > 135  --  133*  --  135 136 134*  K 3.6   < > 4.0   < > 3.6  --  3.9  --  3.6 3.3* 3.6  CL 104   < > 101   < > 104  --  102  --  106 107 105  CO2 23   < > 17*   < > 15*  --  16*  --  17* 17* 17*  GLUCOSE 91   < > 117*   < > 139*  --  165*  --  130* 121* 122*  BUN 20   < > 29*   < > 36*  --  41*  --  47* 49* 52*  CREATININE 2.44*   < > 2.65*   < > 2.75*  --  3.11*  --  3.22* 3.27* 3.30*  CALCIUM 7.4*   < > 6.9*   <  > 6.6*   < > 7.0*   < > 7.4* 7.4* 8.0*  MG 1.2*  --  2.1  --  1.7  --   --   --  2.0  --  1.9  PHOS 3.3  --  6.6*  --  6.6*  --   --   --   --   --  5.9*   < > = values in this interval not displayed.    Liver Function Tests: Recent Labs  Lab 06/28/20 0336 06/28/20 0336 06/29/20 0342 06/30/20 0415 07/01/20 0625 07/03/20 0500 07/04/20 0609  AST 3,414*  --  902* 394* 201* 59*  --   ALT 536*  --  315* 215* 172* 95*  --   ALKPHOS 218*  --  134* 119 198* 183*  --   BILITOT 2.0*  --  2.8* 2.8* 2.3* 1.2  --   PROT 5.9*  --  5.8* 5.9* 6.2* 5.7*  --  ALBUMIN 2.8*   < > 2.8* 2.8* 2.8* 2.5* 2.4*   < > = values in this interval not displayed.   Recent Labs  Lab 06/27/20 1753  LIPASE 30   No results for input(s): AMMONIA in the last 168 hours.  CBC: Recent Labs  Lab 06/30/20 0415 07/01/20 0625 07/02/20 0616 07/03/20 0501 07/04/20 0609  WBC 15.4* 9.8 8.9 10.3 12.8*  NEUTROABS 14.4* 8.5* 7.2 8.3* 10.6*  HGB 9.4* 9.9* 9.6* 10.5* 11.2*  HCT 27.6* 27.3* 27.3* 29.5* 31.0*  MCV 104.5* 102.6* 102.2* 101.0* 99.7  PLT 88* 83* 86* 144* 214    Cardiac Enzymes: No results for input(s): CKTOTAL, CKMB, CKMBINDEX, TROPONINI in the last 168 hours.  BNP: Invalid input(s): POCBNP  CBG: Recent Labs  Lab 07/03/20 1136 07/03/20 1643 07/03/20 2112 07/04/20 0723 07/04/20 1121  GLUCAP 130* 180* 115* 110* 151*    Microbiology: Results for orders placed or performed during the hospital encounter of 06/27/20  Blood culture (routine single)     Status: Abnormal   Collection Time: 06/27/20  7:51 PM   Specimen: BLOOD  Result Value Ref Range Status   Specimen Description   Final    BLOOD LEFT ANTECUBITAL Performed at [facility], [address], [place], [place] [address]    Special Requests   Final    BOTTLES DRAWN AEROBIC AND ANAEROBIC Blood Culture adequate volume Performed at [facility], [address] [place], [place] [address]    Culture  Setup Time    Final    Organism ID to follow [place] AEROBIC AND ANAEROBIC BOTTLES CRITICAL RESULT CALLED TO, READ BACK BY AND VERIFIED WITHLloyd Huger AT [id number] 06/28/20 [place] Performed at [facility], [address] [place], [place] [address]    Culture ESCHERICHIA COLI (A)  Final   Report Status 06/30/2020 FINAL  Final   Organism ID, Bacteria ESCHERICHIA COLI  Final      Susceptibility   Escherichia coli - MIC*    AMPICILLIN >=32 RESISTANT Resistant     CEFAZOLIN <=4 SENSITIVE Sensitive     CEFEPIME <=0.12 SENSITIVE Sensitive     CEFTAZIDIME <=1 SENSITIVE Sensitive     CEFTRIAXONE <=0.25 SENSITIVE Sensitive     CIPROFLOXACIN <=0.25 SENSITIVE Sensitive     GENTAMICIN <=1 SENSITIVE Sensitive     IMIPENEM <=0.25 SENSITIVE Sensitive     TRIMETH/SULFA <=20 SENSITIVE Sensitive     AMPICILLIN/SULBACTAM >=32 RESISTANT Resistant     PIP/TAZO <=4 SENSITIVE Sensitive     * ESCHERICHIA COLI  Respiratory Panel by RT PCR (Flu A&B, Covid) - Nasopharyngeal Swab     Status: None   Collection Time: 06/27/20  7:51 PM   Specimen: Nasopharyngeal Swab  Result Value Ref Range Status   SARS Coronavirus 2 by RT PCR NEGATIVE NEGATIVE Final    Comment: (NOTE) SARS-CoV-2 target nucleic acids are NOT DETECTED.  The SARS-CoV-2 RNA is generally detectable in upper respiratoy specimens during the acute phase of infection. The lowest concentration of SARS-CoV-2 viral copies this assay can detect is 131 copies/mL. A negative result does not preclude SARS-Cov-2 infection and should not be used as the sole basis for treatment or other patient management decisions. A negative result may occur with  improper specimen collection/handling, submission of specimen other than nasopharyngeal swab, presence of viral mutation(s) within the areas targeted by this assay, and inadequate number of viral copies (<131 copies/mL). A negative result must be combined with clinical observations, patient  history, and epidemiological information. The expected result is Negative.  Fact Sheet for Patients:  PinkCheek.be  Fact Sheet for Healthcare Providers:  GravelBags.it  This test is no t yet approved or cleared by the [place] FDA and  has been authorized for detection and/or diagnosis of SARS-CoV-2 by FDA under an Emergency Use Authorization (EUA). This EUA will remain  in effect (meaning this test can be used) for the duration of the COVID-19 declaration under Section 564(b)(1) of the Act, 21 U.S.C. section 360bbb-3(b)(1), unless the authorization is terminated or revoked sooner.     Influenza A by PCR NEGATIVE NEGATIVE Final   Influenza B by PCR NEGATIVE NEGATIVE Final    Comment: (NOTE) The Xpert Xpress SARS-CoV-2/FLU/RSV assay is intended as an aid in  the diagnosis of influenza from Nasopharyngeal swab specimens and  should not be used as a sole basis for treatment. Nasal washings and  aspirates are unacceptable for Xpert Xpress SARS-CoV-2/FLU/RSV  testing.  Fact Sheet for Patients: PinkCheek.be  Fact Sheet for Healthcare Providers: GravelBags.it  This test is not yet approved or cleared by the [place] FDA and  has been authorized for detection and/or diagnosis of SARS-CoV-2 by  FDA under an Emergency Use Authorization (EUA). This EUA will remain  in effect (meaning this test can be used) for the duration of the  Covid-19 declaration under Section 564(b)(1) of the Act, 21  U.S.C. section 360bbb-3(b)(1), unless the authorization is  terminated or revoked. Performed at [facility], [address] [place], [place] [address]   Blood Culture ID Panel (Reflexed)     Status: Abnormal   Collection Time: 06/27/20  7:51 PM  Result Value Ref Range Status   Enterococcus faecalis NOT DETECTED NOT DETECTED Final   Enterococcus Faecium NOT  DETECTED NOT DETECTED Final   Listeria monocytogenes NOT DETECTED NOT DETECTED Final   Staphylococcus species NOT DETECTED NOT DETECTED Final   Staphylococcus aureus (BCID) NOT DETECTED NOT DETECTED Final   Staphylococcus epidermidis NOT DETECTED NOT DETECTED Final   Staphylococcus lugdunensis NOT DETECTED NOT DETECTED Final   Streptococcus species NOT DETECTED NOT DETECTED Final   Streptococcus agalactiae NOT DETECTED NOT DETECTED Final   Streptococcus pneumoniae NOT DETECTED NOT DETECTED Final   Streptococcus pyogenes NOT DETECTED NOT DETECTED Final   A.calcoaceticus-baumannii NOT DETECTED NOT DETECTED Final   Bacteroides fragilis NOT DETECTED NOT DETECTED Final   Enterobacterales DETECTED (A) NOT DETECTED Final    Comment: Enterobacterales represent a large order of gram negative bacteria, not a single organism. CRITICAL RESULT CALLED TO, READ BACK BY AND VERIFIED WITH:  NATHAN BELUE AT 2841 06/28/20 SDR    Enterobacter cloacae complex NOT DETECTED NOT DETECTED Final   Escherichia coli DETECTED (A) NOT DETECTED Final    Comment: CRITICAL RESULT CALLED TO, READ BACK BY AND VERIFIED WITH:  NATHAN BELUE AT 3244 06/28/20 SDR    Klebsiella aerogenes NOT DETECTED NOT DETECTED Final   Klebsiella oxytoca NOT DETECTED NOT DETECTED Final   Klebsiella pneumoniae NOT DETECTED NOT DETECTED Final   Proteus species NOT DETECTED NOT DETECTED Final   Salmonella species NOT DETECTED NOT DETECTED Final   Serratia marcescens NOT DETECTED NOT DETECTED Final   Haemophilus influenzae NOT DETECTED NOT DETECTED Final   Neisseria meningitidis NOT DETECTED NOT DETECTED Final   Pseudomonas aeruginosa NOT DETECTED NOT DETECTED Final   Stenotrophomonas maltophilia NOT DETECTED NOT DETECTED Final   Candida albicans NOT DETECTED NOT DETECTED Final   Candida auris NOT DETECTED NOT  DETECTED Final   Candida glabrata NOT DETECTED NOT DETECTED Final   Candida krusei NOT DETECTED NOT DETECTED Final   Candida  parapsilosis NOT DETECTED NOT DETECTED Final   Candida tropicalis NOT DETECTED NOT DETECTED Final   Cryptococcus neoformans/gattii NOT DETECTED NOT DETECTED Final   CTX-M ESBL NOT DETECTED NOT DETECTED Final   Carbapenem resistance IMP NOT DETECTED NOT DETECTED Final   Carbapenem resistance KPC NOT DETECTED NOT DETECTED Final   Carbapenem resistance NDM NOT DETECTED NOT DETECTED Final   Carbapenem resist OXA 48 LIKE NOT DETECTED NOT DETECTED Final   Carbapenem resistance VIM NOT DETECTED NOT DETECTED Final    Comment: Performed at [facility], [address] [place], [place] [address]  MRSA PCR Screening     Status: None   Collection Time: 06/28/20  5:21 AM   Specimen: Nasopharyngeal  Result Value Ref Range Status   MRSA by PCR NEGATIVE NEGATIVE Final    Comment:        The GeneXpert MRSA Assay (FDA approved for NASAL specimens only), is one component of a comprehensive MRSA colonization surveillance program. It is not intended to diagnose MRSA infection nor to guide or monitor treatment for MRSA infections. Performed at [facility], [address], [place], [place] [address]   Aerobic/Anaerobic Culture (surgical/deep wound)     Status: None   Collection Time: 06/28/20 11:02 AM   Specimen: Fluid; Abscess  Result Value Ref Range Status   Specimen Description   Final    FLUID Performed at [facility], [address], [place], [place] [address]    Special Requests   Final    gallbladder fluid Performed at [facility], [place], [place] [address]    Gram Stain   Final    FEW WBC PRESENT,BOTH PMN AND MONONUCLEAR MODERATE GRAM POSITIVE RODS    Culture   Final    FEW ESCHERICHIA COLI RARE KLEBSIELLA OXYTOCA NO ANAEROBES ISOLATED Performed at [facility], [address], [place], [place] [address]    Report Status 07/03/2020 FINAL  Final   Organism ID, Bacteria ESCHERICHIA COLI  Final   Organism ID, Bacteria  KLEBSIELLA OXYTOCA  Final      Susceptibility   Escherichia coli - MIC*    AMPICILLIN >=32 RESISTANT Resistant     CEFAZOLIN <=4 SENSITIVE Sensitive     CEFEPIME <=0.12 SENSITIVE Sensitive     CEFTAZIDIME <=1 SENSITIVE Sensitive     CEFTRIAXONE <=0.25 SENSITIVE Sensitive     CIPROFLOXACIN <=0.25 SENSITIVE Sensitive     GENTAMICIN <=1 SENSITIVE Sensitive     IMIPENEM <=0.25 SENSITIVE Sensitive     TRIMETH/SULFA <=20 SENSITIVE Sensitive     AMPICILLIN/SULBACTAM 16 INTERMEDIATE Intermediate     PIP/TAZO <=4 SENSITIVE Sensitive     * FEW ESCHERICHIA COLI   Klebsiella oxytoca - MIC*    AMPICILLIN >=32 RESISTANT Resistant     CEFAZOLIN <=4 SENSITIVE Sensitive     CEFEPIME <=0.12 SENSITIVE Sensitive     CEFTAZIDIME <=1 SENSITIVE Sensitive     CEFTRIAXONE <=0.25 SENSITIVE Sensitive     CIPROFLOXACIN <=0.25 SENSITIVE Sensitive     GENTAMICIN <=1 SENSITIVE Sensitive     IMIPENEM <=0.25 SENSITIVE Sensitive     TRIMETH/SULFA <=20 SENSITIVE Sensitive     AMPICILLIN/SULBACTAM 8 SENSITIVE Sensitive     PIP/TAZO <=4 SENSITIVE Sensitive     * RARE KLEBSIELLA OXYTOCA    Coagulation Studies: No results for input(s): LABPROT, INR in  the last 72 hours.  Urinalysis: No results for input(s): COLORURINE, LABSPEC, PHURINE, GLUCOSEU, HGBUR, BILIRUBINUR, KETONESUR, PROTEINUR, UROBILINOGEN, NITRITE, LEUKOCYTESUR in the last 72 hours.  Invalid input(s): APPERANCEUR    Imaging: No results found.   Medications:   . sodium chloride Stopped (07/03/20 1547)  . cefTRIAXone (ROCEPHIN)  IV 2 g (07/03/20 1718)  . dextrose 5 % 1,000 mL with sodium bicarbonate 75 mEq infusion 70 mL/hr at 07/04/20 1112   . Chlorhexidine Gluconate Cloth  6 each Topical Q0600  . heparin injection (subcutaneous)  5,000 Units Subcutaneous Q8H  . hydrocortisone sod succinate (SOLU-CORTEF) inj  50 mg Intravenous Q12H  . pantoprazole  40 mg Oral Daily  . Ensure Max Protein  11 oz Oral BID  . sodium chloride flush  5 mL  Intracatheter Q8H   docusate sodium, HYDROcodone-acetaminophen, loperamide, morphine injection, ondansetron (ZOFRAN) IV, polyethylene glycol  Assessment/ Plan:  59 y.o. female with past medical history of hypertension, hyperlipidemia, nonalcoholic fatty liver disease who presented with cholelithiasis and gallbladder wall thickening consistent with cholecystitis.  1.  Acute kidney injury secondary to ATN.   Baseline creatinine 0.7.  Suspect acute kidney injury secondary to sepsis from biliary source.   Lab Results  Component Value Date   CREATININE 3.30 (H) 07/04/2020   CREATININE 3.27 (H) 07/03/2020   CREATININE 3.22 (H) 07/02/2020  Recorded urine Output for preceding 24 hours is 750 ml. No acute indication for dialysis Strict Intake-Output monitoring  2.  Metabolic acidosis.   Serum bicarbonate 17.   Plan to increase Sodium Bicarbonate infusion to 70 ml/hr  3.  Hypokalemia.   Potassium 3.6 today Received replacement yesterday   LOS: 7 Khaled Herda 10/26/202112:29 PM

## 2020-07-04 NOTE — Care Plan (Signed)
Pt fem cath pulled at this time. Pt tolerated well. Pt given full CHG bath and linen change. Purewick changed as well. Dressing changed to biliary tube site.

## 2020-07-04 NOTE — Progress Notes (Signed)
Sepsis screen not appropriate at this time. Pt admitted with sepsis related to cholecystitis. Will continue to monitor.

## 2020-07-04 NOTE — Progress Notes (Signed)
[facility] NAME: Carrie Hardy    MR#:  [id number]  DATE OF BIRTH:  1961-01-11  SUBJECTIVE:  patient came in on 19 October with severe sepsis secondary to cholecystitis requiring ICU admission and IV pressers with IV fluids. She underwent percutaneous gallbladder drain. She is currently on IV antibiotics. Transferred out of ICU on 25 October.  Currently sitting in the bed. Denies any abdominal pain. Tolerating clear liquid diet. No nausea vomiting or diarrhea. Fever. Hemodynamically stable.  REVIEW OF SYSTEMS:   Review of Systems  Constitutional: Negative for chills, fever and weight loss.  HENT: Negative for ear discharge, ear pain and nosebleeds.   Eyes: Negative for blurred vision, pain and discharge.  Respiratory: Negative for sputum production, shortness of breath, wheezing and stridor.   Cardiovascular: Negative for chest pain, palpitations, orthopnea and PND.  Gastrointestinal: Negative for abdominal pain, diarrhea, nausea and vomiting.  Genitourinary: Negative for frequency and urgency.  Musculoskeletal: Negative for back pain and joint pain.  Neurological: Positive for weakness. Negative for sensory change, speech change and focal weakness.  Psychiatric/Behavioral: Negative for depression and hallucinations. The patient is not nervous/anxious.    Tolerating Diet:yes Tolerating PT: pending   DRUG ALLERGIES:   Allergies  Allergen Reactions  . Tramadol Itching    VITALS:  Blood pressure (!) 134/93, pulse 81, temperature (!) 97.4 F (36.3 C), temperature source Oral, resp. rate 16, height 5\' 5"  (1.651 m), weight 107 kg, SpO2 97 %.  PHYSICAL EXAMINATION:   Physical Exam  GENERAL:  59 y.o.-year-old patient lying in the bed with no acute distress. Obese  EYES: Pupils equal, round, reactive to light and accommodation. No scleral icterus.   HEENT: Head atraumatic, normocephalic. Oropharynx and nasopharynx clear.   NECK:  Supple, no jugular venous distention. No thyroid enlargement, no tenderness.  LUNGS: Normal breath sounds bilaterally, no wheezing, rales, rhonchi. No use of accessory muscles of respiration.  CARDIOVASCULAR: S1, S2 normal. No murmurs, rubs, or gallops.  ABDOMEN: Soft, nontender, nondistended. Bowel sounds present. No organomegaly or mass. Percutaneous GB drain + EXTREMITIES: No cyanosis, clubbing or edema b/l.   Right Groin femoral line+ NEUROLOGIC: Cranial nerves II through XII are intact. No focal Motor or sensory deficits b/l.   PSYCHIATRIC:  patient is alert and oriented x 3.  SKIN: No obvious rash, lesion, or ulcer.   LABORATORY PANEL:  CBC Recent Labs  Lab 07/04/20 0609  WBC 12.8*  HGB 11.2*  HCT 31.0*  PLT 214    Chemistries  Recent Labs  Lab 07/03/20 0500 07/03/20 0500 07/04/20 0609  NA 136   < > 134*  K 3.3*   < > 3.6  CL 107   < > 105  CO2 17*   < > 17*  GLUCOSE 121*   < > 122*  BUN 49*   < > 52*  CREATININE 3.27*   < > 3.30*  CALCIUM 7.4*   < > 8.0*  MG  --   --  1.9  AST 59*  --   --   ALT 95*  --   --   ALKPHOS 183*  --   --   BILITOT 1.2  --   --    < > = values in this interval not displayed.   Cardiac Enzymes No results for input(s): TROPONINI in the last 168 hours. RADIOLOGY:  No results found. ASSESSMENT AND PLAN:  KAMY POINSETT is a 59 y.o. female with  a history of hypertension, hyperlipidemia who came in with feeling lightheaded.  Found to have a low blood pressure of about 70/50.  She denies any recent pain, denies vomiting or diarrhea, reports eating and drinking normally. Patient was admitted with severe sepsis suspected due to cholecystitis on workup in the ER.  #Severe sepsis with septic shock requiring IV pressers/ATN secondary to cholecystitis -patient was admitted in the ICU -blood cultures grew E. coli  -patient was on IV pressers required aggressive hydration with IV fluids -transferred out of ICU October 25 -continue  IV Rocephin for total two weeks -patient seen by surgery Dr. Adora Fridge follow-up outpatient for definitive treatment of gallbladder disease -femoral central line to be removed today -advance diet to full liquid -physical therapy to see patient -taper off stress dose steroid today.  #Acute renal failure with acidosis secondary to severe sepsis/septic shock  ATN -came in with creatinine of 2.3-----> 3.3 -followed by nephrology. -Urine output picking up -no urgent indication for dialysis -continue bicarb drip per nephrology recommendation  #Hyperglycemia due to dextrose drip and stress dose steroids -continue sugar check.  #Hyperlipidemia resume atorvastatin  #History of hypertension -blood pressure stable-- holding meds at present -patient takes amlodipine and metoprolol at home.  #Thrombocytopenia acute in the setting of severe sepsis resolved -platelet count 214K -start DVT prophylaxis  #Generalized weakness physical therapy to see patient  Procedures: percutaneous gallbladder drain Family communication : Consults : surgery, ICU CODE STATUS: full DVT Prophylaxis : heparin  Status is: Inpatient  Remains inpatient appropriate because:IV treatments appropriate due to intensity of illness or inability to take PO and Inpatient level of care appropriate due to severity of illness   Dispo: The patient is from: Home              Anticipated d/c is to: Home              Anticipated d/c date is: 2 days              Patient currently is not medically stable to d/c. patient got out of ICU last night. Admitted with severe septic shock secondary to gallbladder disease. Improving slowly. Need to monitor renal function. Continue IV antibiotics. Physical therapy consultation placed. TOC for discharge planning.      TOTAL TIME TAKING CARE OF THIS PATIENT: 25 minutes.  >50% time spent on counselling and coordination of care  Note: This dictation was prepared with Dragon dictation  along with smaller phrase technology. Any transcriptional errors that result from this process are unintentional.  Fritzi Mandes M.D    [facility]   CC: Primary care physician; Ricardo Jericho, NPPatient ID: ANJOLINA BYRER, female   DOB: 28-Jan-1961, 59 y.o.   MRN: [id number]

## 2020-07-04 NOTE — Evaluation (Signed)
Physical Therapy Evaluation Patient Details Name: Carrie Hardy MRN: [id number] DOB: [birth year] Today's Date: 07/04/2020   History of Present Illness  Johna Kearl is a 59 y.o. female with PMH of HTN, HLD, and NAFLD. MD assessment includes Severe Sepsiswith Cholecystitis, Anion Gap Metabolic Acidosis, and Transaminitis secondary to suspected cholecystitis complicated by chronic hepatic steatosis  Clinical Impression  Pt was pleasant and motivated to participate during the session. Pt reports feeling weaker than normal self but agreed to try to move out of bed. Pt able to perform supine to sit with Min guard and use of elevated HOB and bed rails. Pt reported feeling dizzy upon sitting up. BP was taken and was found to be 160/108. Nursing cleared pt for mobility. Pt was able to perform STS with Min guard and was asymptomatic. Pt was able to ambulate 2' to chair with Min guard and RW and no LOB. Pt was then able to ambulate another 60' with Min guard and RW with greatly decreased gait speed. BP following ambulation and in sitting was 148/ 130. Nursing notified. Pt will benefit from HHPT servicesupon discharge to safely address deficits listed in patient problem list for decreased caregiver assistance and eventual return to PLOF.      Follow Up Recommendations Home health PT;Supervision for mobility/OOB    Equipment Recommendations  Rolling walker with 5" wheels    Recommendations for Other Services       Precautions / Restrictions Precautions Precautions: None Restrictions Weight Bearing Restrictions: No      Mobility  Bed Mobility Overal bed mobility: Needs Assistance Bed Mobility: Supine to Sit     Supine to sit: Min guard     General bed mobility comments: pt uses bed rails and elevated HOB. pt reports feeling lightheaded upon standing    Transfers Overall transfer level: Needs assistance Equipment used: Rolling walker (2 wheeled) Transfers: Sit to/from Stand Sit  to Stand: Min guard         General transfer comment: pt able to easily come to standing and reports no dizziness  Ambulation/Gait Ambulation/Gait assistance: Min guard Gait Distance (Feet): 12 Feet x1, 2 feet x1 Assistive device: Rolling walker (2 wheeled) Gait Pattern/deviations: Decreased stride length;WFL(Within Functional Limits) Gait velocity: greatly decreased   General Gait Details: pt able to walk with good mechanics and no LOB.  Stairs            Wheelchair Mobility    Modified Rankin (Stroke Patients Only)       Balance Overall balance assessment: Needs assistance Sitting-balance support: Feet supported Sitting balance-Leahy Scale: Good Sitting balance - Comments: pt reports dizziness upon coming up and leans on L arm for support. pt able to maintain their own balance with no issues when dizziness subsided Postural control: Left lateral lean Standing balance support: Bilateral upper extremity supported;During functional activity Standing balance-Leahy Scale: Good Standing balance comment: pt able to maintain static standing and dynamic balance                             Pertinent Vitals/Pain Pain Assessment: Faces Faces Pain Scale: No hurt Pain Intervention(s): Monitored during session    Home Living Family/patient expects to be discharged to:: Private residence Living Arrangements: Spouse/significant other Available Help at Discharge: Available 24 hours/day Type of Home: House Home Access: Stairs to enter Entrance Stairs-Rails: None (pt reports using trelis around stairs) Entrance Stairs-Number of Steps: 1 Home Layout: Able to live on main  level with bedroom/bathroom;Two level        Prior Function Level of Independence: Independent         Comments: Pt reports being a community ambulator and amble to ambulate without limitations     Hand Dominance        Extremity/Trunk Assessment                Communication    Communication: No difficulties  Cognition Arousal/Alertness: Awake/alert Behavior During Therapy: WFL for tasks assessed/performed Overall Cognitive Status: Within Functional Limits for tasks assessed                                        General Comments      Exercises Other Exercises Other Exercises: pt educated on POC Other Exercises: pt educated on walker usage while pt continues to feel weak   Assessment/Plan    PT Assessment Patient needs continued PT services  PT Problem List Decreased mobility;Decreased activity tolerance;Decreased balance       PT Treatment Interventions DME instruction;Therapeutic exercise;Gait training;Balance training;Stair training;Functional mobility training;Cognitive remediation;Therapeutic activities    PT Goals (Current goals can be found in the Care Plan section)  Acute Rehab PT Goals Patient Stated Goal: to get well PT Goal Formulation: With patient Time For Goal Achievement: 07/17/20 Potential to Achieve Goals: Good    Frequency Min 2X/week   Barriers to discharge        Co-evaluation               AM-PAC PT "6 Clicks" Mobility  Outcome Measure Help needed turning from your back to your side while in a flat bed without using bedrails?: A Little Help needed moving from lying on your back to sitting on the side of a flat bed without using bedrails?: A Little Help needed moving to and from a bed to a chair (including a wheelchair)?: A Little Help needed standing up from a chair using your arms (e.g., wheelchair or bedside chair)?: A Little Help needed to walk in hospital room?: A Little Help needed climbing 3-5 steps with a railing? : A Lot 6 Click Score: 17    End of Session Equipment Utilized During Treatment: Gait belt Activity Tolerance: Patient tolerated treatment well Patient left: in chair;with call bell/phone within reach;with chair alarm set Nurse Communication: Mobility status PT Visit Diagnosis:  Muscle weakness (generalized) (M62.81);Difficulty in walking, not elsewhere classified (R26.2)    Time: 1287-8676 PT Time Calculation (min) (ACUTE ONLY): 50 min   Charges:              Hervey Ard, SPT 07/04/20. 5:17 PM

## 2020-07-05 ENCOUNTER — Inpatient Hospital Stay: Payer: Federal, State, Local not specified - PPO

## 2020-07-05 DIAGNOSIS — E782 Mixed hyperlipidemia: Secondary | ICD-10-CM

## 2020-07-05 LAB — RENAL FUNCTION PANEL
Albumin: 2.5 g/dL — ABNORMAL LOW (ref 3.5–5.0)
Anion gap: 12 (ref 5–15)
BUN: 56 mg/dL — ABNORMAL HIGH (ref 8–23)
CO2: 20 mmol/L — ABNORMAL LOW (ref 22–32)
Calcium: 8 mg/dL — ABNORMAL LOW (ref 8.9–10.3)
Chloride: 104 mmol/L (ref 98–111)
Creatinine, Ser: 3.03 mg/dL — ABNORMAL HIGH (ref 0.44–1.00)
GFR, Estimated: 17 mL/min — ABNORMAL LOW (ref >60)
Glucose, Bld: 125 mg/dL — ABNORMAL HIGH (ref 70–99)
Phosphorus: 5.4 mg/dL — ABNORMAL HIGH (ref 2.5–4.6)
Potassium: 2.8 mmol/L — ABNORMAL LOW (ref 3.5–5.1)
Sodium: 136 mmol/L (ref 135–145)

## 2020-07-05 LAB — GLUCOSE, CAPILLARY
Glucose-Capillary: 101 mg/dL — ABNORMAL HIGH (ref 70–99)
Glucose-Capillary: 160 mg/dL — ABNORMAL HIGH (ref 70–99)
Glucose-Capillary: 90 mg/dL (ref 70–99)
Glucose-Capillary: 93 mg/dL (ref 70–99)

## 2020-07-05 MED ORDER — ALUM & MAG HYDROXIDE-SIMETH 200-200-20 MG/5ML PO SUSP
30.0000 mL | Freq: Four times a day (QID) | ORAL | Status: DC | PRN
  Administered 2020-07-05: 30 mL via ORAL
  Filled 2020-07-05: qty 30

## 2020-07-05 MED ORDER — POTASSIUM CHLORIDE 10 MEQ/100ML IV SOLN
10.0000 meq | INTRAVENOUS | Status: AC
  Administered 2020-07-05 (×2): 10 meq via INTRAVENOUS
  Filled 2020-07-05 (×2): qty 100

## 2020-07-05 NOTE — Plan of Care (Signed)
Pt Carrie Hardy. Calm and cooperative and able to voice her needs. Pt continues to drain bile content. Pt had watery BM. Imodium administered. IVF infusing. Pt denies any pain. Safety measures in place. Will continue to monitor.  Problem: Education: Goal: Knowledge of General Education information will improve Description: Including pain rating scale, medication(s)/side effects and non-pharmacologic comfort measures Outcome: Progressing   Problem: Health Behavior/Discharge Planning: Goal: Ability to manage health-related needs will improve Outcome: Progressing   Problem: Clinical Measurements: Goal: Ability to maintain clinical measurements within normal limits will improve Outcome: Progressing Goal: Will remain free from infection Outcome: Progressing Goal: Diagnostic test results will improve Outcome: Progressing Goal: Respiratory complications will improve Outcome: Progressing Goal: Cardiovascular complication will be avoided Outcome: Progressing   Problem: Activity: Goal: Risk for activity intolerance will decrease Outcome: Progressing   Problem: Nutrition: Goal: Adequate nutrition will be maintained Outcome: Progressing   Problem: Coping: Goal: Level of anxiety will decrease Outcome: Progressing   Problem: Elimination: Goal: Will not experience complications related to bowel motility Outcome: Progressing Goal: Will not experience complications related to urinary retention Outcome: Progressing   Problem: Pain Managment: Goal: General experience of comfort will improve Outcome: Progressing   Problem: Safety: Goal: Ability to remain free from injury will improve Outcome: Progressing   Problem: Skin Integrity: Goal: Risk for impaired skin integrity will decrease Outcome: Progressing

## 2020-07-05 NOTE — Progress Notes (Signed)
PROGRESS NOTE  Carrie Hardy:[id number] DOB: [birth year] DOA: 06/27/2020 PCP: Ricardo Jericho, NP  Brief History   Carrie Carlino Mukombeis a 59 y.o.femalewith a history of hypertension, hyperlipidemia who came in with feeling lightheaded. Found to have a low blood pressure of about 70/50. She denies any recent pain, denies vomiting or diarrhea, reports eating and drinking normally. Patient was admitted with severe sepsis suspected due to cholecystitis on workup in the ER.  The patient was admitted to the ICU. She required pressors and aggressive IV Fluid resuscitation. Blood cultures grew out E. Coli. She will require 2 weeks total of IV Rocephin. She was transferred out of the ICU to the hospitalist service on 07/03/2020. Dr. Adora Fridge saw the patient for general surgery. The plan is for the patient to follow up with him as outpatient for definitive treatment of gallbladder disease.  Cholecystostomy tube was placed by IR on 06/28/2020. In the ICU she had a femoral line that was removed on 07/04/2020. She is being tapered off of stress doses of steroids. She has been advanced to a full liquid diet.  Consultants  . PCCM . General surgery . Nephrology . Palliative Care . Interventional Radiology  Procedures  . Placement of cholecystostomy tube on 06/28/2020  Antibiotics   Anti-infectives (From admission, onward)   Start     Dose/Rate Route Frequency Ordered Stop   06/30/20 1700  cefTRIAXone (ROCEPHIN) 2 g in sodium chloride 0.9 % 100 mL IVPB        2 g 200 mL/hr over 30 Minutes Intravenous Every 24 hours 06/30/20 1552     06/27/20 2315  piperacillin-tazobactam (ZOSYN) IVPB 3.375 g  Status:  Discontinued        3.375 g 12.5 mL/hr over 240 Minutes Intravenous Every 8 hours 06/27/20 2312 06/30/20 1552   06/27/20 2100  metroNIDAZOLE (FLAGYL) IVPB 500 mg        500 mg 100 mL/hr over 60 Minutes Intravenous  Once 06/27/20 2054 06/27/20 2225   06/27/20 1930  cefTRIAXone  (ROCEPHIN) 2 g in sodium chloride 0.9 % 100 mL IVPB        2 g 200 mL/hr over 30 Minutes Intravenous  Once 06/27/20 1926 06/27/20 2053    .  Subjective  The patient is resting comfortably. No new complaints.  Objective   Vitals:  Vitals:   07/05/20 1155 07/05/20 1546  BP: (!) 132/97 122/82  Pulse: 91 80  Resp: 16 16  Temp: 97.7 F (36.5 C) 98.7 F (37.1 C)  SpO2: 96% 97%   Exam:  Constitutional:  . The patient is awake, alert, and oriented x 3. No acute distress. Respiratory:  . No increased work of breathing. . No wheezes, rales, or rhonchi . No tactile fremitus Cardiovascular:  . Regular rate and rhythm . No murmurs, ectopy, or gallups. . No lateral PMI. No thrills. Abdomen:  . Abdomen is soft, non-tender, non-distended . No hernias, masses, or organomegaly . Normoactive bowel sounds.  Musculoskeletal:  . No cyanosis, clubbing, or edema Skin:  . No rashes, lesions, ulcers . palpation of skin: no induration or nodules Neurologic:  . CN 2-12 intact . Sensation all 4 extremities intact Psychiatric:  . Mental status o Mood, affect appropriate o Orientation to person, place, time  . judgment and insight appear intact  I have personally reviewed the following:   Today's Data  . Vitals, BMP  Micro Data  . Blood cultures x 2: E. Coli . Cholecystic fluid; Klebsiella oxytoca . Both  sensitive to Ceftriaxone  Imaging  . CT abdomen and pelvis . Renal ultrasound . Abdominal ultrasound  Scheduled Meds: . Chlorhexidine Gluconate Cloth  6 each Topical Q0600  . heparin injection (subcutaneous)  5,000 Units Subcutaneous Q8H  . multivitamin with minerals  1 tablet Oral Daily  . pantoprazole  40 mg Oral Daily  . Ensure Max Protein  11 oz Oral BID  . sodium chloride flush  5 mL Intracatheter Q8H   Continuous Infusions: . sodium chloride Stopped (07/03/20 1547)  . cefTRIAXone (ROCEPHIN)  IV Stopped (07/04/20 1636)  . dextrose 5 % 1,000 mL with sodium  bicarbonate 75 mEq infusion 70 mL/hr at 07/05/20 1401    Active Problems:   Acute cholecystitis   Hyperlipidemia   Acute renal failure with tubular necrosis (HCC)   LOS: 8 days   A & P   Severe sepsis with septic shock requiring IV pressers/ATN secondary to cholecystitis: The patient was admitted to the ICU. She required pressors and aggressive IV Fluid resuscitation. Blood cultures grew out E. Coli. She will require 2 weeks total of IV Rocephin. She was transferred out of the ICU to the hospitalist service on 07/03/2020. Dr. Adora Fridge saw the patient for general surgery. The plan is for the patient to follow up with him as outpatient for definitive treatment of gallbladder disease.  Cholecystostomy tube was placed by IR on 06/28/2020. In the ICU she had a femoral line that was removed on 07/04/2020. She is being tapered off of stress doses of steroids. She has been advanced to a full liquid diet. Blood cultures grew out E. Coli. Culture of cholecystic fluid has grown out Klebsiella oxytoca. Both are sensitive to rocephin.   Acute renal failure with acidosis secondary to severe sepsis/septic shock and ATN: It appears that the patient's baseline creatinine was between 1 and 1.5. Upon admission her creatinine was 1.88. It increased to a peak of 3.3 on 07/04/2020. It has declined to 3.03 in the last 24 hours. Nephrology has been consulted. Her urine output has improved. Nephrology sees no acute need for dialysis. She completed a sodium bicarbonate drip on 07/04/2020.  Hypokalemia: Supplemented today in HD. Sodium bicarbonate drip has been discontinued.  Hyperglycemia due to dextrose drip and stress dose steroids: Glucoses are monitored by FSBS and SSI.  Hyperlipidemia:  resume atorvastatin when creatinine is in acceptable range.  History of hypertension: The patient is presently normotensive without home amlodipine and metoprolol.   Thrombocytopenia acute in the setting of severe sepsis:  Resolved. 214 on 07/04/2020. Pharmacological DVT Prophylaxis resumed.   Generalized weakness: PT/OT to evaluate and treat the patient.  I have seen and examined this patient myself. I have spent 35 minutes in her evaluation and care.  Family communication : None available CODE STATUS: full DVT Prophylaxis : heparin  Status is: Inpatient  Remains inpatient appropriate because:IV treatments appropriate due to intensity of illness or inability to take PO and Inpatient level of care appropriate due to severity of illness   Dispo: The patient is from: Home  Anticipated d/c is to: Home  Anticipated d/c date is: 2 days  Patient currently is not medically stable to d/c. patient got out of ICU last night. Admitted with severe septic shock secondary to gallbladder disease. Improving slowly. Need to monitor renal function. Continue IV antibiotics. Physical therapy consultation placed. TOC for discharge planning.   Orlean Holtrop, DO [facility] Direct contact: see [email or web address]  7PM-7AM contact night coverage as above 07/05/2020, 3:56 PM  LOS:  8 days

## 2020-07-05 NOTE — Plan of Care (Signed)
Continue with plan of care.  

## 2020-07-05 NOTE — Progress Notes (Signed)
[facility]  ROUNDING NOTE   Subjective:  Patient is awake, alert, sitting up in bed, consuming lunch. She denies nausea, or vomiting. She still has intermittent  abdominal cramps. Denies shortness of breath.  Objective:  Vital signs in last 24 hours:  Temp:  [97.5 F (36.4 C)-98.7 F (37.1 C)] 98.7 F (37.1 C) (10/27 1546) Pulse Rate:  [80-102] 80 (10/27 1546) Resp:  [16-18] 16 (10/27 1546) BP: (101-136)/(71-97) 122/82 (10/27 1546) SpO2:  [96 %-98 %] 97 % (10/27 1546) Weight:  [103.2 kg] 103.2 kg (10/27 0600)  Weight change: -3.807 kg Filed Weights   07/01/20 0500 07/04/20 0434 07/05/20 0600  Weight: 97.6 kg 107 kg 103.2 kg    Intake/Output: I/O last 3 completed shifts: In: 1320 [P.O.:1320] Out: 3130 [Urine:2450; Drains:680]   Intake/Output this shift:  Total I/O In: 3291.2 [P.O.:360; I.V.:2726.2; Other:5; IV Piggyback:200] Out: 100 [Drains:100]  Physical Exam: General:  Pleasant, appears comfortable  Head:  Normocephalic, atraumatic ,moist oral mucosal membranes  Eyes:  Anicteric  Lungs:   Normal and symmetrical respiratory effort, lungs clear  Heart: S1S2 no rubs or gallops  Abdomen:   Soft, nontender, non distended,cholecystostomy drain with greenish output.  Extremities:   Trace bilateral lower extremity edema  Neurologic:  Alert, awake, speech clear and appropriate  Skin:  No acute lesions or rashes    Basic Metabolic Panel: Recent Labs  Lab 06/29/20 0342 06/29/20 0342 06/30/20 0415 06/30/20 0415 07/01/20 0625 07/01/20 0625 07/02/20 0616 07/02/20 0616 07/03/20 0500 07/04/20 0609 07/05/20 1004  NA 136   < > 135   < > 133*  --  135  --  136 134* 136  K 4.0   < > 3.6   < > 3.9  --  3.6  --  3.3* 3.6 2.8*  CL 101   < > 104   < > 102  --  106  --  107 105 104  CO2 17*   < > 15*   < > 16*  --  17*  --  17* 17* 20*  GLUCOSE 117*   < > 139*   < > 165*  --  130*  --  121* 122* 125*  BUN 29*   < > 36*   < > 41*  --  47*  --  49* 52* 56*   CREATININE 2.65*   < > 2.75*   < > 3.11*  --  3.22*  --  3.27* 3.30* 3.03*  CALCIUM 6.9*   < > 6.6*   < > 7.0*   < > 7.4*   < > 7.4* 8.0* 8.0*  MG 2.1  --  1.7  --   --   --  2.0  --   --  1.9  --   PHOS 6.6*  --  6.6*  --   --   --   --   --   --  5.9* 5.4*   < > = values in this interval not displayed.    Liver Function Tests: Recent Labs  Lab 06/29/20 0342 06/29/20 0342 06/30/20 0415 07/01/20 0625 07/03/20 0500 07/04/20 0609 07/05/20 1004  AST 902*  --  394* 201* 59*  --   --   ALT 315*  --  215* 172* 95*  --   --   ALKPHOS 134*  --  119 198* 183*  --   --   BILITOT 2.8*  --  2.8* 2.3* 1.2  --   --   PROT  5.8*  --  5.9* 6.2* 5.7*  --   --   ALBUMIN 2.8*   < > 2.8* 2.8* 2.5* 2.4* 2.5*   < > = values in this interval not displayed.   No results for input(s): LIPASE, AMYLASE in the last 168 hours. No results for input(s): AMMONIA in the last 168 hours.  CBC: Recent Labs  Lab 06/30/20 0415 07/01/20 0625 07/02/20 0616 07/03/20 0501 07/04/20 0609  WBC 15.4* 9.8 8.9 10.3 12.8*  NEUTROABS 14.4* 8.5* 7.2 8.3* 10.6*  HGB 9.4* 9.9* 9.6* 10.5* 11.2*  HCT 27.6* 27.3* 27.3* 29.5* 31.0*  MCV 104.5* 102.6* 102.2* 101.0* 99.7  PLT 88* 83* 86* 144* 214    Cardiac Enzymes: No results for input(s): CKTOTAL, CKMB, CKMBINDEX, TROPONINI in the last 168 hours.  BNP: Invalid input(s): POCBNP  CBG: Recent Labs  Lab 07/04/20 1659 07/05/20 0034 07/05/20 0735 07/05/20 1153 07/05/20 1543  GLUCAP 135* 160* 93 90 101*    Microbiology: Results for orders placed or performed during the hospital encounter of 06/27/20  Blood culture (routine single)     Status: Abnormal   Collection Time: 06/27/20  7:51 PM   Specimen: BLOOD  Result Value Ref Range Status   Specimen Description   Final    BLOOD LEFT ANTECUBITAL Performed at [facility], [address], [place], [place] [address]    Special Requests   Final    BOTTLES DRAWN AEROBIC AND ANAEROBIC Blood Culture  adequate volume Performed at [facility], [address] [place], [place] [address]    Culture  Setup Time   Final    Organism ID to follow [place] IN BOTH AEROBIC AND ANAEROBIC BOTTLES CRITICAL RESULT CALLED TO, READ BACK BY AND VERIFIED WITHLloyd Huger AT 0240 06/28/20 [place] Performed at [facility], [address] [place], [place] [address]    Culture ESCHERICHIA COLI (A)  Final   Report Status 06/30/2020 FINAL  Final   Organism ID, Bacteria ESCHERICHIA COLI  Final      Susceptibility   Escherichia coli - MIC*    AMPICILLIN >=32 RESISTANT Resistant     CEFAZOLIN <=4 SENSITIVE Sensitive     CEFEPIME <=0.12 SENSITIVE Sensitive     CEFTAZIDIME <=1 SENSITIVE Sensitive     CEFTRIAXONE <=0.25 SENSITIVE Sensitive     CIPROFLOXACIN <=0.25 SENSITIVE Sensitive     GENTAMICIN <=1 SENSITIVE Sensitive     IMIPENEM <=0.25 SENSITIVE Sensitive     TRIMETH/SULFA <=20 SENSITIVE Sensitive     AMPICILLIN/SULBACTAM >=32 RESISTANT Resistant     PIP/TAZO <=4 SENSITIVE Sensitive     * ESCHERICHIA COLI  Respiratory Panel by RT PCR (Flu A&B, Covid) - Nasopharyngeal Swab     Status: None   Collection Time: 06/27/20  7:51 PM   Specimen: Nasopharyngeal Swab  Result Value Ref Range Status   SARS Coronavirus 2 by RT PCR NEGATIVE NEGATIVE Final    Comment: (NOTE) SARS-CoV-2 target nucleic acids are NOT DETECTED.  The SARS-CoV-2 RNA is generally detectable in upper respiratoy specimens during the acute phase of infection. The lowest concentration of SARS-CoV-2 viral copies this assay can detect is 131 copies/mL. A negative result does not preclude SARS-Cov-2 infection and should not be used as the sole basis for treatment or other patient management decisions. A negative result may occur with  improper specimen collection/handling, submission of specimen other than nasopharyngeal swab, presence of viral mutation(s) within the areas targeted by this assay, and  inadequate number of  viral copies (<131 copies/mL). A negative result must be combined with clinical observations, patient history, and epidemiological information. The expected result is Negative.  Fact Sheet for Patients:  PinkCheek.be  Fact Sheet for Healthcare Providers:  GravelBags.it  This test is no t yet approved or cleared by the [place] FDA and  has been authorized for detection and/or diagnosis of SARS-CoV-2 by FDA under an Emergency Use Authorization (EUA). This EUA will remain  in effect (meaning this test can be used) for the duration of the COVID-19 declaration under Section 564(b)(1) of the Act, 21 U.S.C. section 360bbb-3(b)(1), unless the authorization is terminated or revoked sooner.     Influenza A by PCR NEGATIVE NEGATIVE Final   Influenza B by PCR NEGATIVE NEGATIVE Final    Comment: (NOTE) The Xpert Xpress SARS-CoV-2/FLU/RSV assay is intended as an aid in  the diagnosis of influenza from Nasopharyngeal swab specimens and  should not be used as a sole basis for treatment. Nasal washings and  aspirates are unacceptable for Xpert Xpress SARS-CoV-2/FLU/RSV  testing.  Fact Sheet for Patients: PinkCheek.be  Fact Sheet for Healthcare Providers: GravelBags.it  This test is not yet approved or cleared by the [place] FDA and  has been authorized for detection and/or diagnosis of SARS-CoV-2 by  FDA under an Emergency Use Authorization (EUA). This EUA will remain  in effect (meaning this test can be used) for the duration of the  Covid-19 declaration under Section 564(b)(1) of the Act, 21  U.S.C. section 360bbb-3(b)(1), unless the authorization is  terminated or revoked. Performed at [facility], [address] [place], [place] [address]   Blood Culture ID Panel (Reflexed)     Status: Abnormal   Collection Time: 06/27/20  7:51  PM  Result Value Ref Range Status   Enterococcus faecalis NOT DETECTED NOT DETECTED Final   Enterococcus Faecium NOT DETECTED NOT DETECTED Final   Listeria monocytogenes NOT DETECTED NOT DETECTED Final   Staphylococcus species NOT DETECTED NOT DETECTED Final   Staphylococcus aureus (BCID) NOT DETECTED NOT DETECTED Final   Staphylococcus epidermidis NOT DETECTED NOT DETECTED Final   Staphylococcus lugdunensis NOT DETECTED NOT DETECTED Final   Streptococcus species NOT DETECTED NOT DETECTED Final   Streptococcus agalactiae NOT DETECTED NOT DETECTED Final   Streptococcus pneumoniae NOT DETECTED NOT DETECTED Final   Streptococcus pyogenes NOT DETECTED NOT DETECTED Final   A.calcoaceticus-baumannii NOT DETECTED NOT DETECTED Final   Bacteroides fragilis NOT DETECTED NOT DETECTED Final   Enterobacterales DETECTED (A) NOT DETECTED Final    Comment: Enterobacterales represent a large order of gram negative bacteria, not a single organism. CRITICAL RESULT CALLED TO, READ BACK BY AND VERIFIED WITH:  NATHAN BELUE AT 7124 06/28/20 SDR    Enterobacter cloacae complex NOT DETECTED NOT DETECTED Final   Escherichia coli DETECTED (A) NOT DETECTED Final    Comment: CRITICAL RESULT CALLED TO, READ BACK BY AND VERIFIED WITH:  NATHAN BELUE AT 5809 06/28/20 SDR    Klebsiella aerogenes NOT DETECTED NOT DETECTED Final   Klebsiella oxytoca NOT DETECTED NOT DETECTED Final   Klebsiella pneumoniae NOT DETECTED NOT DETECTED Final   Proteus species NOT DETECTED NOT DETECTED Final   Salmonella species NOT DETECTED NOT DETECTED Final   Serratia marcescens NOT DETECTED NOT DETECTED Final   Haemophilus influenzae NOT DETECTED NOT DETECTED Final   Neisseria meningitidis NOT DETECTED NOT DETECTED Final   Pseudomonas aeruginosa NOT DETECTED NOT DETECTED Final   Stenotrophomonas maltophilia NOT DETECTED NOT DETECTED Final  Candida albicans NOT DETECTED NOT DETECTED Final   Candida auris NOT DETECTED NOT DETECTED Final    Candida glabrata NOT DETECTED NOT DETECTED Final   Candida krusei NOT DETECTED NOT DETECTED Final   Candida parapsilosis NOT DETECTED NOT DETECTED Final   Candida tropicalis NOT DETECTED NOT DETECTED Final   Cryptococcus neoformans/gattii NOT DETECTED NOT DETECTED Final   CTX-M ESBL NOT DETECTED NOT DETECTED Final   Carbapenem resistance IMP NOT DETECTED NOT DETECTED Final   Carbapenem resistance KPC NOT DETECTED NOT DETECTED Final   Carbapenem resistance NDM NOT DETECTED NOT DETECTED Final   Carbapenem resist OXA 48 LIKE NOT DETECTED NOT DETECTED Final   Carbapenem resistance VIM NOT DETECTED NOT DETECTED Final    Comment: Performed at [facility], [address] [place], [place] [address]  MRSA PCR Screening     Status: None   Collection Time: 06/28/20  5:21 AM   Specimen: Nasopharyngeal  Result Value Ref Range Status   MRSA by PCR NEGATIVE NEGATIVE Final    Comment:        The GeneXpert MRSA Assay (FDA approved for NASAL specimens only), is one component of a comprehensive MRSA colonization surveillance program. It is not intended to diagnose MRSA infection nor to guide or monitor treatment for MRSA infections. Performed at [facility], [address], [place], [place] [address]   Aerobic/Anaerobic Culture (surgical/deep wound)     Status: None   Collection Time: 06/28/20 11:02 AM   Specimen: Fluid; Abscess  Result Value Ref Range Status   Specimen Description   Final    FLUID Performed at [facility], [address], [place], [place] [address]    Special Requests   Final    gallbladder fluid Performed at [facility], [place], [place] [address]    Gram Stain   Final    FEW WBC PRESENT,BOTH PMN AND MONONUCLEAR MODERATE GRAM POSITIVE RODS    Culture   Final    FEW ESCHERICHIA COLI RARE KLEBSIELLA OXYTOCA NO ANAEROBES ISOLATED Performed at [facility], [place] [address], [place], [place] [address]     Report Status 07/03/2020 FINAL  Final   Organism ID, Bacteria ESCHERICHIA COLI  Final   Organism ID, Bacteria KLEBSIELLA OXYTOCA  Final      Susceptibility   Escherichia coli - MIC*    AMPICILLIN >=32 RESISTANT Resistant     CEFAZOLIN <=4 SENSITIVE Sensitive     CEFEPIME <=0.12 SENSITIVE Sensitive     CEFTAZIDIME <=1 SENSITIVE Sensitive     CEFTRIAXONE <=0.25 SENSITIVE Sensitive     CIPROFLOXACIN <=0.25 SENSITIVE Sensitive     GENTAMICIN <=1 SENSITIVE Sensitive     IMIPENEM <=0.25 SENSITIVE Sensitive     TRIMETH/SULFA <=20 SENSITIVE Sensitive     AMPICILLIN/SULBACTAM 16 INTERMEDIATE Intermediate     PIP/TAZO <=4 SENSITIVE Sensitive     * FEW ESCHERICHIA COLI   Klebsiella oxytoca - MIC*    AMPICILLIN >=32 RESISTANT Resistant     CEFAZOLIN <=4 SENSITIVE Sensitive     CEFEPIME <=0.12 SENSITIVE Sensitive     CEFTAZIDIME <=1 SENSITIVE Sensitive     CEFTRIAXONE <=0.25 SENSITIVE Sensitive     CIPROFLOXACIN <=0.25 SENSITIVE Sensitive     GENTAMICIN <=1 SENSITIVE Sensitive     IMIPENEM <=0.25 SENSITIVE Sensitive     TRIMETH/SULFA <=20 SENSITIVE Sensitive     AMPICILLIN/SULBACTAM 8 SENSITIVE Sensitive     PIP/TAZO <=4 SENSITIVE Sensitive     * RARE  KLEBSIELLA OXYTOCA    Coagulation Studies: No results for input(s): LABPROT, INR in the last 72 hours.  Urinalysis: No results for input(s): COLORURINE, LABSPEC, PHURINE, GLUCOSEU, HGBUR, BILIRUBINUR, KETONESUR, PROTEINUR, UROBILINOGEN, NITRITE, LEUKOCYTESUR in the last 72 hours.  Invalid input(s): APPERANCEUR    Imaging: US RENAL  Result Date: 07/05/2020 CLINICAL DATA:  Acute kidney failure EXAM: RENAL / URINARY TRACT ULTRASOUND COMPLETE COMPARISON:  Abdominal MRI June 28, 2020 FINDINGS: Right Kidney: Renal measurements: 11.8 x 6.4 x 6.5 cm = volume: 259.0 mL. Echogenicity and renal cortical thickness are within normal limits. No hydronephrosis visualized. There is a cyst in the mid right kidney measuring 1.0 x 1.0 cm. There is  perinephric fluid on the right. No sonographically demonstrable calculus or ureterectasis. Left Kidney: Renal measurements: 11.4 x 6.2 x 6.1 cm = volume: 277.3 mL. Echogenicity and renal cortical thickness are within normal limits. No mass or hydronephrosis visualized. There is slight perinephric fluid on the left. No sonographically demonstrable calculus or ureterectasis. Bladder: Appears normal for degree of bladder distention. Other: Slight ascites.  Left pleural effusion. IMPRESSION: 1. Perinephric fluid collections bilaterally, larger on the right than on the left. Etiology uncertain. 2. No obstructing focus in either kidney. Renal cortical thickness and echogenicity within normal limits bilaterally. 3.  1 cm cyst mid right kidney. 4.  Left pleural effusion. 5.  Mild ascites. Electronically Signed   By: Lowella Grip III M.D.   On: 07/05/2020 11:34     Medications:   . sodium chloride Stopped (07/03/20 1547)  . cefTRIAXone (ROCEPHIN)  IV Stopped (07/04/20 1636)  . dextrose 5 % 1,000 mL with sodium bicarbonate 75 mEq infusion 70 mL/hr at 07/05/20 1401   . Chlorhexidine Gluconate Cloth  6 each Topical Q0600  . heparin injection (subcutaneous)  5,000 Units Subcutaneous Q8H  . multivitamin with minerals  1 tablet Oral Daily  . pantoprazole  40 mg Oral Daily  . Ensure Max Protein  11 oz Oral BID  . sodium chloride flush  5 mL Intracatheter Q8H   alum & mag hydroxide-simeth, docusate sodium, HYDROcodone-acetaminophen, loperamide, morphine injection, ondansetron (ZOFRAN) IV, polyethylene glycol  Assessment/ Plan:  59 y.o. female with past medical history of hypertension, hyperlipidemia, nonalcoholic fatty liver disease who presented with cholelithiasis and gallbladder wall thickening consistent with cholecystitis.  1.  Acute kidney injury secondary to ATN.   Baseline creatinine 0.7.     Lab Results  Component Value Date   CREATININE 3.03 (H) 07/05/2020   CREATININE 3.30 (H) 07/04/2020    CREATININE 3.27 (H) 07/03/2020  Renal function is improving gradually We will continue monitoring closely No acute indication for dialysis  2.  Metabolic acidosis.   Serum bicarbonate improved, 20 today Will discontinue IV Sodium bicarbonate infusion  3.  Hypokalemia.   Potassium down to 2.8 today We will supplement with IV KCL    LOS: 8 Tiffanny Lamarche 10/27/20214:38 PM

## 2020-07-06 LAB — CBC WITH DIFFERENTIAL/PLATELET
Abs Immature Granulocytes: 0.33 K/uL — ABNORMAL HIGH (ref 0.00–0.07)
Basophils Absolute: 0 K/uL (ref 0.0–0.1)
Basophils Relative: 0 %
Eosinophils Absolute: 0.1 K/uL (ref 0.0–0.5)
Eosinophils Relative: 1 %
HCT: 30.2 % — ABNORMAL LOW (ref 36.0–46.0)
Hemoglobin: 10.9 g/dL — ABNORMAL LOW (ref 12.0–15.0)
Immature Granulocytes: 3 %
Lymphocytes Relative: 7 %
Lymphs Abs: 0.9 K/uL (ref 0.7–4.0)
MCH: 36.5 pg — ABNORMAL HIGH (ref 26.0–34.0)
MCHC: 36.1 g/dL — ABNORMAL HIGH (ref 30.0–36.0)
MCV: 101 fL — ABNORMAL HIGH (ref 80.0–100.0)
Monocytes Absolute: 1 K/uL (ref 0.1–1.0)
Monocytes Relative: 8 %
Neutro Abs: 10 K/uL — ABNORMAL HIGH (ref 1.7–7.7)
Neutrophils Relative %: 81 %
Platelets: 276 K/uL (ref 150–400)
RBC: 2.99 MIL/uL — ABNORMAL LOW (ref 3.87–5.11)
RDW: 15.4 % (ref 11.5–15.5)
WBC: 12.3 K/uL — ABNORMAL HIGH (ref 4.0–10.5)
nRBC: 0 % (ref 0.0–0.2)

## 2020-07-06 LAB — ANA W/REFLEX IF POSITIVE: Anti Nuclear Antibody (ANA): NEGATIVE

## 2020-07-06 LAB — GLUCOSE, CAPILLARY
Glucose-Capillary: 77 mg/dL (ref 70–99)
Glucose-Capillary: 80 mg/dL (ref 70–99)
Glucose-Capillary: 81 mg/dL (ref 70–99)

## 2020-07-06 LAB — BASIC METABOLIC PANEL
Anion gap: 9 (ref 5–15)
BUN: 53 mg/dL — ABNORMAL HIGH (ref 8–23)
CO2: 21 mmol/L — ABNORMAL LOW (ref 22–32)
Calcium: 7.9 mg/dL — ABNORMAL LOW (ref 8.9–10.3)
Chloride: 107 mmol/L (ref 98–111)
Creatinine, Ser: 2.79 mg/dL — ABNORMAL HIGH (ref 0.44–1.00)
GFR, Estimated: 19 mL/min — ABNORMAL LOW (ref >60)
Glucose, Bld: 78 mg/dL (ref 70–99)
Potassium: 3.3 mmol/L — ABNORMAL LOW (ref 3.5–5.1)
Sodium: 137 mmol/L (ref 135–145)

## 2020-07-06 LAB — C4 COMPLEMENT: Complement C4, Body Fluid: 10 mg/dL — ABNORMAL LOW (ref 12–38)

## 2020-07-06 LAB — C3 COMPLEMENT: C3 Complement: 89 mg/dL (ref 82–167)

## 2020-07-06 LAB — GLOMERULAR BASEMENT MEMBRANE ANTIBODIES: GBM Ab: 3 units (ref 0–20)

## 2020-07-06 MED ORDER — POTASSIUM CHLORIDE CRYS ER 20 MEQ PO TBCR
20.0000 meq | EXTENDED_RELEASE_TABLET | Freq: Two times a day (BID) | ORAL | Status: DC
  Administered 2020-07-06 – 2020-07-07 (×3): 20 meq via ORAL
  Filled 2020-07-06 (×3): qty 1

## 2020-07-06 NOTE — Progress Notes (Signed)
Sepsis screen not appropriate at this time. Pt admitted with sepsis related to cholecystitis. Will continue to monitor.

## 2020-07-06 NOTE — Progress Notes (Signed)
PROGRESS NOTE  Carrie Hardy:[id number] DOB: Mar 29, 1961 DOA: 06/27/2020 PCP: Ricardo Jericho, NP  Brief History   Carrie Cass Mukombeis a 59 y.o.femalewith a history of hypertension, hyperlipidemia who came in with feeling lightheaded. Found to have a low blood pressure of about 70/50. She denies any recent pain, denies vomiting or diarrhea, reports eating and drinking normally. Patient was admitted with severe sepsis suspected due to cholecystitis on workup in the ER.  The patient was admitted to the ICU. She required pressors and aggressive IV Fluid resuscitation. Blood cultures grew out E. Coli. She will require 2 weeks total of IV Rocephin. She was transferred out of the ICU to the hospitalist service on 07/03/2020. Dr. Adora Fridge saw the patient for general surgery. The plan is for the patient to follow up with him as outpatient for definitive treatment of gallbladder disease.  Cholecystostomy tube was placed by IR on 06/28/2020. In the ICU she had a femoral line that was removed on 07/04/2020. She is being tapered off of stress doses of steroids. She has been advanced to a full liquid diet.  Recommendation is for completion of 14 days of IV Rocephin to cover for the patient's E colli bacteremia and Kelbsiella from cholecystic fluid.. Today is day 10.  Consultants  . PCCM . General surgery . Nephrology . Palliative Care . Interventional Radiology  Procedures  . Placement of cholecystostomy tube on 06/28/2020  Antibiotics   Anti-infectives (From admission, onward)   Start     Dose/Rate Route Frequency Ordered Stop   06/30/20 1700  cefTRIAXone (ROCEPHIN) 2 g in sodium chloride 0.9 % 100 mL IVPB        2 g 200 mL/hr over 30 Minutes Intravenous Every 24 hours 06/30/20 1552     06/27/20 2315  piperacillin-tazobactam (ZOSYN) IVPB 3.375 g  Status:  Discontinued        3.375 g 12.5 mL/hr over 240 Minutes Intravenous Every 8 hours 06/27/20 2312 06/30/20 1552   06/27/20  2100  metroNIDAZOLE (FLAGYL) IVPB 500 mg        500 mg 100 mL/hr over 60 Minutes Intravenous  Once 06/27/20 2054 06/27/20 2225   06/27/20 1930  cefTRIAXone (ROCEPHIN) 2 g in sodium chloride 0.9 % 100 mL IVPB        2 g 200 mL/hr over 30 Minutes Intravenous  Once 06/27/20 1926 06/27/20 2053     Subjective  The patient is resting comfortably. No new complaints.  Objective   Vitals:  Vitals:   07/06/20 0853 07/06/20 1211  BP: 110/77 111/83  Pulse: 86 100  Resp: 18 20  Temp:  98.4 F (36.9 C)  SpO2: 98% 99%   Exam:  Constitutional:  . The patient is awake, alert, and oriented x 3. No acute distress. Respiratory:  . No increased work of breathing. . No wheezes, rales, or rhonchi . No tactile fremitus Cardiovascular:  . Regular rate and rhythm . No murmurs, ectopy, or gallups. . No lateral PMI. No thrills. Abdomen:  . Abdomen is soft, non-tender, non-distended . No hernias, masses, or organomegaly . Normoactive bowel sounds.  Musculoskeletal:  . No cyanosis, clubbing, or edema Skin:  . No rashes, lesions, ulcers . palpation of skin: no induration or nodules Neurologic:  . CN 2-12 intact . Sensation all 4 extremities intact Psychiatric:  . Mental status o Mood, affect appropriate o Orientation to person, place, time  . judgment and insight appear intact  I have personally reviewed the following:   Today's  Data  . Vitals, BMP  Micro Data  . Blood cultures x 2: E. Coli . Cholecystic fluid; Klebsiella oxytoca . Both sensitive to Ceftriaxone  Imaging  . CT abdomen and pelvis . Renal ultrasound . Abdominal ultrasound  Scheduled Meds: . Chlorhexidine Gluconate Cloth  6 each Topical Q0600  . heparin injection (subcutaneous)  5,000 Units Subcutaneous Q8H  . multivitamin with minerals  1 tablet Oral Daily  . pantoprazole  40 mg Oral Daily  . potassium chloride  20 mEq Oral BID  . Ensure Max Protein  11 oz Oral BID  . sodium chloride flush  5 mL Intracatheter  Q8H   Continuous Infusions: . sodium chloride 250 mL (07/05/20 1843)  . cefTRIAXone (ROCEPHIN)  IV 2 g (07/05/20 1844)    Active Problems:   Acute cholecystitis   Hyperlipidemia   Acute renal failure with tubular necrosis (HCC)   LOS: 9 days   A & P   Severe sepsis with septic shock requiring IV pressers/ATN secondary to cholecystitis: The patient was admitted to the ICU. She required pressors and aggressive IV Fluid resuscitation. Blood cultures grew out E. Coli. She will require 2 weeks total of IV Rocephin. She was transferred out of the ICU to the hospitalist service on 07/03/2020. Dr. Adora Fridge saw the patient for general surgery. The plan is for the patient to follow up with him as outpatient for definitive treatment of gallbladder disease.  Cholecystostomy tube was placed by IR on 06/28/2020. In the ICU she had a femoral line that was removed on 07/04/2020. She is being tapered off of stress doses of steroids. She has been advanced to a full liquid diet. Blood cultures grew out E. Coli. Culture of cholecystic fluid has grown out Klebsiella oxytoca. Both are sensitive to rocephin. Recommendation is for completion of 14 days of IV Rocephin to cover for the patient's E colli bacteremia and Kelbsiella from cholecystic fluid.. Today is day 10.  Acute renal failure with acidosis secondary to severe sepsis/septic shock and ATN: It appears that the patient's baseline creatinine was between 1 and 1.5. Upon admission her creatinine was 1.88. It increased to a peak of 3.3 on 07/04/2020. It has declined to 3.03 in the last 24 hours. Nephrology has been consulted. Her urine output has improved. Nephrology sees no acute need for dialysis. She completed a sodium bicarbonate drip on 07/04/2020.  Hypokalemia: Supplemented and follow. Sodium bicarbonate drip has been discontinued.  Hyperglycemia due to dextrose drip and stress dose steroids: Glucoses are monitored by FSBS and SSI.  Hyperlipidemia:   resume atorvastatin when creatinine is in acceptable range.  History of hypertension: The patient is presently normotensive without home amlodipine and metoprolol.   Thrombocytopenia acute in the setting of severe sepsis: Resolved. 214 on 07/04/2020. Pharmacological DVT Prophylaxis resumed.   Generalized weakness: PT/OT to evaluate and treat the patient.  I have seen and examined this patient myself. I have spent 32 minutes in her evaluation and care.  Family communication : None available CODE STATUS: full DVT Prophylaxis : heparin  Status is: Inpatient  Remains inpatient appropriate because:IV treatments appropriate due to intensity of illness or inability to take PO and Inpatient level of care appropriate due to severity of illness  Dispo: The patient is from: Home  Anticipated d/c is to: Home  Anticipated d/c date is: 2 days  Patient currently is not medically stable to d/c. patient got out of ICU last night. Admitted with severe septic shock secondary to gallbladder disease. Improving  slowly. Need to monitor renal function. Continue IV antibiotics. Physical therapy consultation placed. TOC for discharge planning.   Shanecia Hoganson, DO [facility] Direct contact: see [email or web address]  7PM-7AM contact night coverage as above 07/06/2020, 3:01 PM  LOS: 8 days

## 2020-07-06 NOTE — Progress Notes (Signed)
Physical Therapy Treatment Patient Details Name: Carrie Hardy MRN: [id number] DOB: 1961/01/07 Today's Date: 07/06/2020    History of Present Illness Carrie Hardy is a 59 y.o. female with PMH of HTN, HLD, and NAFLD. MD assessment includes Severe Sepsis with Cholecystitis, Anion Gap Metabolic Acidosis,Acute renal failure with acidosis, and Transaminitis secondary to cholecystitis complicated by chronic hepatic steatosis    PT Comments    Pt sitting EOB upon arrival, agreed to PT. Sit to stand transfers from various surfaces with CGA and vc's for hand placement. Pt able to transfer on and off commode, perform independent hygiene with SBA. Gait training with RW in room x 62ft with CGA and vc's for pacing. O2 sats 93% upon exertion with increased fatigue. Pt tolerated session well. Continue per POC  Follow Up Recommendations  Home health PT;Supervision for mobility/OOB     Equipment Recommendations  Rolling walker with 5" wheels    Recommendations for Other Services      Precautions / Restrictions Precautions Precautions: None Restrictions Weight Bearing Restrictions: No    Mobility  Bed Mobility Overal bed mobility: Needs Assistance       Supine to sit: Min guard     General bed mobility comments: pt uses bed rails and elevated HOB. pt reports feeling lightheaded upon standing  Transfers Overall transfer level: Needs assistance Equipment used: Rolling walker (2 wheeled) Transfers: Sit to/from Stand Sit to Stand: Min guard         General transfer comment: pt able to easily come to standing and reports no dizziness  Ambulation/Gait Ambulation/Gait assistance: Min guard Gait Distance (Feet): 20 Feet Assistive device: Rolling walker (2 wheeled) Gait Pattern/deviations: Decreased stride length;WFL(Within Functional Limits) Gait velocity: greatly decreased   General Gait Details: pt able to walk with good mechanics and no LOB.   Stairs              Wheelchair Mobility    Modified Rankin (Stroke Patients Only)       Balance                                            Cognition Arousal/Alertness: Awake/alert Behavior During Therapy: WFL for tasks assessed/performed Overall Cognitive Status: Within Functional Limits for tasks assessed                                        Exercises      General Comments        Pertinent Vitals/Pain Pain Assessment: No/denies pain Faces Pain Scale: No hurt    Home Living                      Prior Function            PT Goals (current goals can now be found in the care plan section) Acute Rehab PT Goals Patient Stated Goal: to get well Progress towards PT goals: Progressing toward goals    Frequency    Min 2X/week      PT Plan Current plan remains appropriate    Co-evaluation              AM-PAC PT "6 Clicks" Mobility   Outcome Measure  Help needed turning from your back to your side while in a flat bed  without using bedrails?: A Little Help needed moving from lying on your back to sitting on the side of a flat bed without using bedrails?: A Little Help needed moving to and from a bed to a chair (including a wheelchair)?: A Little Help needed standing up from a chair using your arms (e.g., wheelchair or bedside chair)?: A Little Help needed to walk in hospital room?: A Little Help needed climbing 3-5 steps with a railing? : A Lot 6 Click Score: 17    End of Session Equipment Utilized During Treatment: Gait belt Activity Tolerance: Patient tolerated treatment well Patient left: in chair;with call bell/phone within reach;with chair alarm set Nurse Communication: Mobility status PT Visit Diagnosis: Muscle weakness (generalized) (M62.81);Difficulty in walking, not elsewhere classified (R26.2)     Time: 1100-1139 PT Time Calculation (min) (ACUTE ONLY): 39 min  Charges:  $Gait Training: 8-22  mins $Therapeutic Activity: 23-37 mins                     Mikel Cella, PTA   Josie Dixon 07/06/2020, 12:34 PM

## 2020-07-06 NOTE — Progress Notes (Signed)
Mobility Specialist - Progress Note   07/06/20 1100  Mobility  Activity Transferred:  Bed to chair  Level of Assistance Modified independent, requires aide device or extra time  Assistive Device Front wheel walker  Distance Ambulated (ft) 3 ft  Mobility Response Tolerated well  Mobility performed by Mobility specialist  $Mobility charge 1 Mobility    Pre-mobility: 95 HR, 96% SpO2 Post-mobility: 103 HR, 95% SpO2  Pt was long-sitting in bed upon arrival utilizing room air. Pt agreed to session. Pt denied any pain, nausea, or fatigue this date. Pt did state that she's feeling a bit "moody" today however. Pt was able to get to EOB with SBA and stood to RW with minA. CGA was used for safety. Upon standing, pt denied any dizziness or weakness in LE. Pt ambulated ~3' to recliner with no LOB noted. No heavy breathing noted, pt denied SOB. Overall, pt tolerated session well. PT entered at the end of session for continuation of activity.    Kathee Delton Mobility Specialist 07/06/20, 11:55 AM

## 2020-07-06 NOTE — Plan of Care (Signed)
Pt Axox4. Calm and cooperative and able to voice her needs. Pt continues to drain dark green bile content. Pt currently taking prn Imodium for watery stools. IVF discontinued. Pt on full diet with Ensure supplement. Generalized weakness. Pt denies any pain. Safety measures in place. Will continue to monitor.  Problem: Education: Goal: Knowledge of General Education information will improve Description: Including pain rating scale, medication(s)/side effects and non-pharmacologic comfort measures Outcome: Progressing   Problem: Health Behavior/Discharge Planning: Goal: Ability to manage health-related needs will improve Outcome: Progressing   Problem: Clinical Measurements: Goal: Ability to maintain clinical measurements within normal limits will improve Outcome: Progressing Goal: Will remain free from infection Outcome: Progressing Goal: Diagnostic test results will improve Outcome: Progressing Goal: Respiratory complications will improve Outcome: Progressing Goal: Cardiovascular complication will be avoided Outcome: Progressing   Problem: Activity: Goal: Risk for activity intolerance will decrease Outcome: Progressing   Problem: Nutrition: Goal: Adequate nutrition will be maintained Outcome: Progressing   Problem: Coping: Goal: Level of anxiety will decrease Outcome: Progressing   Problem: Elimination: Goal: Will not experience complications related to bowel motility Outcome: Progressing Goal: Will not experience complications related to urinary retention Outcome: Progressing   Problem: Pain Managment: Goal: General experience of comfort will improve Outcome: Progressing   Problem: Safety: Goal: Ability to remain free from injury will improve Outcome: Progressing   Problem: Skin Integrity: Goal: Risk for impaired skin integrity will decrease Outcome: Progressing

## 2020-07-06 NOTE — Progress Notes (Signed)
[facility]  ROUNDING NOTE   Subjective:  Patient appears comfortable, sitting up in bed.  Her main concern today was about discharge.  She is concerned that she is not able to move around as much while she is in the hospital.  She says she will feel better when she gets home. Patient's renal function is improving.  Objective:  Vital signs in last 24 hours:  Temp:  [98.1 F (36.7 C)-98.7 F (37.1 C)] 98.4 F (36.9 C) (10/28 1211) Pulse Rate:  [80-100] 100 (10/28 1211) Resp:  [16-20] 20 (10/28 1211) BP: (110-133)/(76-100) 111/83 (10/28 1211) SpO2:  [96 %-99 %] 99 % (10/28 1211) Weight:  [104.6 kg] 104.6 kg (10/28 0418)  Weight change: 1.361 kg Filed Weights   07/04/20 0434 07/05/20 0600 07/06/20 0418  Weight: 107 kg 103.2 kg 104.6 kg    Intake/Output: I/O last 3 completed shifts: In: 3651.2 [P.O.:720; I.V.:2726.2; Other:5; IV Piggyback:200] Out: 0488 [Urine:3140; Drains:570]   Intake/Output this shift:  No intake/output data recorded.  Physical Exam: General:  Resting in bed, in no acute distress  Head:  Moist oral mucosal membranes  Eyes:  Sclerae and conjunctivae clear  Lungs:    lungs clear, respirations even and unlabored  Heart  regular rate and rhythm  Abdomen:   cholecystostomy drain in place, draining moderate amount of greenish yellow colored output  Extremities:  1+ peripheral edema  Neurologic:  Oriented x3  Skin:  No acute lesions or rashes    Basic Metabolic Panel: Recent Labs  Lab 06/30/20 0415 07/01/20 0625 07/02/20 0616 07/02/20 0616 07/03/20 0500 07/03/20 0500 07/04/20 0609 07/05/20 1004 07/06/20 0606  NA 135   < > 135  --  136  --  134* 136 137  K 3.6   < > 3.6  --  3.3*  --  3.6 2.8* 3.3*  CL 104   < > 106  --  107  --  105 104 107  CO2 15*   < > 17*  --  17*  --  17* 20* 21*  GLUCOSE 139*   < > 130*  --  121*  --  122* 125* 78  BUN 36*   < > 47*  --  49*  --  52* 56* 53*  CREATININE 2.75*   < > 3.22*  --  3.27*  --  3.30*  3.03* 2.79*  CALCIUM 6.6*   < > 7.4*   < > 7.4*   < > 8.0* 8.0* 7.9*  MG 1.7  --  2.0  --   --   --  1.9  --   --   PHOS 6.6*  --   --   --   --   --  5.9* 5.4*  --    < > = values in this interval not displayed.    Liver Function Tests: Recent Labs  Lab 06/30/20 0415 07/01/20 0625 07/03/20 0500 07/04/20 0609 07/05/20 1004  AST 394* 201* 59*  --   --   ALT 215* 172* 95*  --   --   ALKPHOS 119 198* 183*  --   --   BILITOT 2.8* 2.3* 1.2  --   --   PROT 5.9* 6.2* 5.7*  --   --   ALBUMIN 2.8* 2.8* 2.5* 2.4* 2.5*   No results for input(s): LIPASE, AMYLASE in the last 168 hours. No results for input(s): AMMONIA in the last 168 hours.  CBC: Recent Labs  Lab 07/01/20 0625 07/02/20 8916  07/03/20 0501 07/04/20 0609 07/06/20 0606  WBC 9.8 8.9 10.3 12.8* 12.3*  NEUTROABS 8.5* 7.2 8.3* 10.6* 10.0*  HGB 9.9* 9.6* 10.5* 11.2* 10.9*  HCT 27.3* 27.3* 29.5* 31.0* 30.2*  MCV 102.6* 102.2* 101.0* 99.7 101.0*  PLT 83* 86* 144* 214 276    Cardiac Enzymes: No results for input(s): CKTOTAL, CKMB, CKMBINDEX, TROPONINI in the last 168 hours.  BNP: Invalid input(s): POCBNP  CBG: Recent Labs  Lab 07/05/20 0735 07/05/20 1153 07/05/20 1543 07/06/20 0044 07/06/20 0758  GLUCAP 93 90 101* 81 77    Microbiology: Results for orders placed or performed during the hospital encounter of 06/27/20  Blood culture (routine single)     Status: Abnormal   Collection Time: 06/27/20  7:51 PM   Specimen: BLOOD  Result Value Ref Range Status   Specimen Description   Final    BLOOD LEFT ANTECUBITAL Performed at [facility], [address], [place], [place] [address]    Special Requests   Final    BOTTLES DRAWN AEROBIC AND ANAEROBIC Blood Culture adequate volume Performed at [facility], [address] [place], [place] [address]    Culture  Setup Time   Final    Organism ID to follow [place] IN BOTH AEROBIC AND ANAEROBIC BOTTLES CRITICAL RESULT CALLED TO,  READ BACK BY AND VERIFIED WITHLloyd Huger AT 7062 06/28/20 [place] Performed at [facility], [address] [place], [place] [address]    Culture ESCHERICHIA COLI (A)  Final   Report Status 06/30/2020 FINAL  Final   Organism ID, Bacteria ESCHERICHIA COLI  Final      Susceptibility   Escherichia coli - MIC*    AMPICILLIN >=32 RESISTANT Resistant     CEFAZOLIN <=4 SENSITIVE Sensitive     CEFEPIME <=0.12 SENSITIVE Sensitive     CEFTAZIDIME <=1 SENSITIVE Sensitive     CEFTRIAXONE <=0.25 SENSITIVE Sensitive     CIPROFLOXACIN <=0.25 SENSITIVE Sensitive     GENTAMICIN <=1 SENSITIVE Sensitive     IMIPENEM <=0.25 SENSITIVE Sensitive     TRIMETH/SULFA <=20 SENSITIVE Sensitive     AMPICILLIN/SULBACTAM >=32 RESISTANT Resistant     PIP/TAZO <=4 SENSITIVE Sensitive     * ESCHERICHIA COLI  Respiratory Panel by RT PCR (Flu A&B, Covid) - Nasopharyngeal Swab     Status: None   Collection Time: 06/27/20  7:51 PM   Specimen: Nasopharyngeal Swab  Result Value Ref Range Status   SARS Coronavirus 2 by RT PCR NEGATIVE NEGATIVE Final    Comment: (NOTE) SARS-CoV-2 target nucleic acids are NOT DETECTED.  The SARS-CoV-2 RNA is generally detectable in upper respiratoy specimens during the acute phase of infection. The lowest concentration of SARS-CoV-2 viral copies this assay can detect is 131 copies/mL. A negative result does not preclude SARS-Cov-2 infection and should not be used as the sole basis for treatment or other patient management decisions. A negative result may occur with  improper specimen collection/handling, submission of specimen other than nasopharyngeal swab, presence of viral mutation(s) within the areas targeted by this assay, and inadequate number of viral copies (<131 copies/mL). A negative result must be combined with clinical observations, patient history, and epidemiological information. The expected result is Negative.  Fact Sheet for Patients:   PinkCheek.be  Fact Sheet for Healthcare Providers:  GravelBags.it  This test is no t yet approved or cleared by the [place] FDA and  has been authorized for detection and/or diagnosis of SARS-CoV-2 by FDA under an Emergency  Use Authorization (EUA). This EUA will remain  in effect (meaning this test can be used) for the duration of the COVID-19 declaration under Section 564(b)(1) of the Act, 21 U.S.C. section 360bbb-3(b)(1), unless the authorization is terminated or revoked sooner.     Influenza A by PCR NEGATIVE NEGATIVE Final   Influenza B by PCR NEGATIVE NEGATIVE Final    Comment: (NOTE) The Xpert Xpress SARS-CoV-2/FLU/RSV assay is intended as an aid in  the diagnosis of influenza from Nasopharyngeal swab specimens and  should not be used as a sole basis for treatment. Nasal washings and  aspirates are unacceptable for Xpert Xpress SARS-CoV-2/FLU/RSV  testing.  Fact Sheet for Patients: PinkCheek.be  Fact Sheet for Healthcare Providers: GravelBags.it  This test is not yet approved or cleared by the [place] FDA and  has been authorized for detection and/or diagnosis of SARS-CoV-2 by  FDA under an Emergency Use Authorization (EUA). This EUA will remain  in effect (meaning this test can be used) for the duration of the  Covid-19 declaration under Section 564(b)(1) of the Act, 21  U.S.C. section 360bbb-3(b)(1), unless the authorization is  terminated or revoked. Performed at [facility], [address] [place], [place] [address]   Blood Culture ID Panel (Reflexed)     Status: Abnormal   Collection Time: 06/27/20  7:51 PM  Result Value Ref Range Status   Enterococcus faecalis NOT DETECTED NOT DETECTED Final   Enterococcus Faecium NOT DETECTED NOT DETECTED Final   Listeria monocytogenes NOT DETECTED NOT DETECTED Final   Staphylococcus  species NOT DETECTED NOT DETECTED Final   Staphylococcus aureus (BCID) NOT DETECTED NOT DETECTED Final   Staphylococcus epidermidis NOT DETECTED NOT DETECTED Final   Staphylococcus lugdunensis NOT DETECTED NOT DETECTED Final   Streptococcus species NOT DETECTED NOT DETECTED Final   Streptococcus agalactiae NOT DETECTED NOT DETECTED Final   Streptococcus pneumoniae NOT DETECTED NOT DETECTED Final   Streptococcus pyogenes NOT DETECTED NOT DETECTED Final   A.calcoaceticus-baumannii NOT DETECTED NOT DETECTED Final   Bacteroides fragilis NOT DETECTED NOT DETECTED Final   Enterobacterales DETECTED (A) NOT DETECTED Final    Comment: Enterobacterales represent a large order of gram negative bacteria, not a single organism. CRITICAL RESULT CALLED TO, READ BACK BY AND VERIFIED WITH:  NATHAN BELUE AT 9924 06/28/20 SDR    Enterobacter cloacae complex NOT DETECTED NOT DETECTED Final   Escherichia coli DETECTED (A) NOT DETECTED Final    Comment: CRITICAL RESULT CALLED TO, READ BACK BY AND VERIFIED WITH:  NATHAN BELUE AT 2683 06/28/20 SDR    Klebsiella aerogenes NOT DETECTED NOT DETECTED Final   Klebsiella oxytoca NOT DETECTED NOT DETECTED Final   Klebsiella pneumoniae NOT DETECTED NOT DETECTED Final   Proteus species NOT DETECTED NOT DETECTED Final   Salmonella species NOT DETECTED NOT DETECTED Final   Serratia marcescens NOT DETECTED NOT DETECTED Final   Haemophilus influenzae NOT DETECTED NOT DETECTED Final   Neisseria meningitidis NOT DETECTED NOT DETECTED Final   Pseudomonas aeruginosa NOT DETECTED NOT DETECTED Final   Stenotrophomonas maltophilia NOT DETECTED NOT DETECTED Final   Candida albicans NOT DETECTED NOT DETECTED Final   Candida auris NOT DETECTED NOT DETECTED Final   Candida glabrata NOT DETECTED NOT DETECTED Final   Candida krusei NOT DETECTED NOT DETECTED Final   Candida parapsilosis NOT DETECTED NOT DETECTED Final   Candida tropicalis NOT DETECTED NOT DETECTED Final    Cryptococcus neoformans/gattii NOT DETECTED NOT DETECTED Final   CTX-M ESBL NOT DETECTED NOT  DETECTED Final   Carbapenem resistance IMP NOT DETECTED NOT DETECTED Final   Carbapenem resistance KPC NOT DETECTED NOT DETECTED Final   Carbapenem resistance NDM NOT DETECTED NOT DETECTED Final   Carbapenem resist OXA 48 LIKE NOT DETECTED NOT DETECTED Final   Carbapenem resistance VIM NOT DETECTED NOT DETECTED Final    Comment: Performed at [facility], [address] [place], [place] [address]  MRSA PCR Screening     Status: None   Collection Time: 06/28/20  5:21 AM   Specimen: Nasopharyngeal  Result Value Ref Range Status   MRSA by PCR NEGATIVE NEGATIVE Final    Comment:        The GeneXpert MRSA Assay (FDA approved for NASAL specimens only), is one component of a comprehensive MRSA colonization surveillance program. It is not intended to diagnose MRSA infection nor to guide or monitor treatment for MRSA infections. Performed at [facility], [address], [place], [place] [address]   Aerobic/Anaerobic Culture (surgical/deep wound)     Status: None   Collection Time: 06/28/20 11:02 AM   Specimen: Fluid; Abscess  Result Value Ref Range Status   Specimen Description   Final    FLUID Performed at [facility], [address], [place], [place] [address]    Special Requests   Final    gallbladder fluid Performed at [facility], [place], [place] [address]    Gram Stain   Final    FEW WBC PRESENT,BOTH PMN AND MONONUCLEAR MODERATE GRAM POSITIVE RODS    Culture   Final    FEW ESCHERICHIA COLI RARE KLEBSIELLA OXYTOCA NO ANAEROBES ISOLATED Performed at [facility], [place] [address], [place], [place] [address]    Report Status 07/03/2020 FINAL  Final   Organism ID, Bacteria ESCHERICHIA COLI  Final   Organism ID, Bacteria KLEBSIELLA OXYTOCA  Final      Susceptibility   Escherichia coli - MIC*    AMPICILLIN >=32  RESISTANT Resistant     CEFAZOLIN <=4 SENSITIVE Sensitive     CEFEPIME <=0.12 SENSITIVE Sensitive     CEFTAZIDIME <=1 SENSITIVE Sensitive     CEFTRIAXONE <=0.25 SENSITIVE Sensitive     CIPROFLOXACIN <=0.25 SENSITIVE Sensitive     GENTAMICIN <=1 SENSITIVE Sensitive     IMIPENEM <=0.25 SENSITIVE Sensitive     TRIMETH/SULFA <=20 SENSITIVE Sensitive     AMPICILLIN/SULBACTAM 16 INTERMEDIATE Intermediate     PIP/TAZO <=4 SENSITIVE Sensitive     * FEW ESCHERICHIA COLI   Klebsiella oxytoca - MIC*    AMPICILLIN >=32 RESISTANT Resistant     CEFAZOLIN <=4 SENSITIVE Sensitive     CEFEPIME <=0.12 SENSITIVE Sensitive     CEFTAZIDIME <=1 SENSITIVE Sensitive     CEFTRIAXONE <=0.25 SENSITIVE Sensitive     CIPROFLOXACIN <=0.25 SENSITIVE Sensitive     GENTAMICIN <=1 SENSITIVE Sensitive     IMIPENEM <=0.25 SENSITIVE Sensitive     TRIMETH/SULFA <=20 SENSITIVE Sensitive     AMPICILLIN/SULBACTAM 8 SENSITIVE Sensitive     PIP/TAZO <=4 SENSITIVE Sensitive     * RARE KLEBSIELLA OXYTOCA    Coagulation Studies: No results for input(s): LABPROT, INR in the last 72 hours.  Urinalysis: No results for input(s): COLORURINE, LABSPEC, PHURINE, GLUCOSEU, HGBUR, BILIRUBINUR, KETONESUR, PROTEINUR, UROBILINOGEN, NITRITE, LEUKOCYTESUR in the last 72 hours.  Invalid input(s): APPERANCEUR    Imaging: US RENAL  Result Date: 07/05/2020 CLINICAL DATA:  Acute kidney failure EXAM: RENAL / URINARY TRACT ULTRASOUND COMPLETE COMPARISON:  Abdominal MRI June 28, 2020 FINDINGS: Right Kidney: Renal measurements: 11.8 x 6.4 x 6.5 cm = volume: 259.0 mL. Echogenicity and renal cortical thickness are within normal limits. No hydronephrosis visualized. There is a cyst in the mid right kidney measuring 1.0 x 1.0 cm. There is perinephric fluid on the right. No sonographically demonstrable calculus or ureterectasis. Left Kidney: Renal measurements: 11.4 x 6.2 x 6.1 cm = volume: 277.3 mL. Echogenicity and renal cortical thickness are  within normal limits. No mass or hydronephrosis visualized. There is slight perinephric fluid on the left. No sonographically demonstrable calculus or ureterectasis. Bladder: Appears normal for degree of bladder distention. Other: Slight ascites.  Left pleural effusion. IMPRESSION: 1. Perinephric fluid collections bilaterally, larger on the right than on the left. Etiology uncertain. 2. No obstructing focus in either kidney. Renal cortical thickness and echogenicity within normal limits bilaterally. 3.  1 cm cyst mid right kidney. 4.  Left pleural effusion. 5.  Mild ascites. Electronically Signed   By: Lowella Grip III M.D.   On: 07/05/2020 11:34     Medications:   . sodium chloride 250 mL (07/05/20 1843)  . cefTRIAXone (ROCEPHIN)  IV 2 g (07/05/20 1844)   . Chlorhexidine Gluconate Cloth  6 each Topical Q0600  . heparin injection (subcutaneous)  5,000 Units Subcutaneous Q8H  . multivitamin with minerals  1 tablet Oral Daily  . pantoprazole  40 mg Oral Daily  . potassium chloride  20 mEq Oral BID  . Ensure Max Protein  11 oz Oral BID  . sodium chloride flush  5 mL Intracatheter Q8H   alum & mag hydroxide-simeth, docusate sodium, HYDROcodone-acetaminophen, loperamide, morphine injection, ondansetron (ZOFRAN) IV, polyethylene glycol  Assessment/ Plan:  59 y.o. female with past medical history of hypertension, hyperlipidemia, nonalcoholic fatty liver disease who presented with cholelithiasis and gallbladder wall thickening consistent with cholecystitis.  1.  Acute kidney injury secondary to ATN.   Baseline creatinine 0.7.     Lab Results  Component Value Date   CREATININE 2.79 (H) 07/06/2020   CREATININE 3.03 (H) 07/05/2020   CREATININE 3.30 (H) 07/04/2020  Patient's kidney function is improving gradually No indication for dialysis  2.  Metabolic acidosis.   CO2 is 21 today, bicarbonate infusion discontinued yesterday  3.  Hypokalemia.   K+ 3.3 today after 20 mEq of KCl IV Will  supplement with Klor-Con today   LOS: 9 Lakaya Tolen 10/28/20212:14 PM

## 2020-07-07 DIAGNOSIS — R6520 Severe sepsis without septic shock: Secondary | ICD-10-CM

## 2020-07-07 LAB — BASIC METABOLIC PANEL
Anion gap: 11 (ref 5–15)
BUN: 50 mg/dL — ABNORMAL HIGH (ref 8–23)
CO2: 21 mmol/L — ABNORMAL LOW (ref 22–32)
Calcium: 8.4 mg/dL — ABNORMAL LOW (ref 8.9–10.3)
Chloride: 110 mmol/L (ref 98–111)
Creatinine, Ser: 2.55 mg/dL — ABNORMAL HIGH (ref 0.44–1.00)
GFR, Estimated: 21 mL/min — ABNORMAL LOW (ref >60)
Glucose, Bld: 83 mg/dL (ref 70–99)
Potassium: 3.6 mmol/L (ref 3.5–5.1)
Sodium: 142 mmol/L (ref 135–145)

## 2020-07-07 LAB — PROTEIN ELECTROPHORESIS, SERUM
A/G Ratio: 0.8 (ref 0.7–1.7)
Albumin ELP: 2.4 g/dL — ABNORMAL LOW (ref 2.9–4.4)
Alpha-1-Globulin: 0.2 g/dL (ref 0.0–0.4)
Alpha-2-Globulin: 0.8 g/dL (ref 0.4–1.0)
Beta Globulin: 0.8 g/dL (ref 0.7–1.3)
Gamma Globulin: 1.1 g/dL (ref 0.4–1.8)
Globulin, Total: 2.9 g/dL (ref 2.2–3.9)
M-Spike, %: 0.3 g/dL — ABNORMAL HIGH
Total Protein ELP: 5.3 g/dL — ABNORMAL LOW (ref 6.0–8.5)

## 2020-07-07 LAB — GLUCOSE, CAPILLARY
Glucose-Capillary: 76 mg/dL (ref 70–99)
Glucose-Capillary: 84 mg/dL (ref 70–99)
Glucose-Capillary: 90 mg/dL (ref 70–99)

## 2020-07-07 LAB — MPO/PR-3 (ANCA) ANTIBODIES
ANCA Proteinase 3: <3.5 U/mL (ref 0.0–3.5)
Myeloperoxidase Abs: <9 U/mL (ref 0.0–9.0)

## 2020-07-07 MED ORDER — POTASSIUM CHLORIDE CRYS ER 20 MEQ PO TBCR
20.0000 meq | EXTENDED_RELEASE_TABLET | Freq: Every day | ORAL | 0 refills | Status: DC
Start: 2020-07-07 — End: 2020-08-14

## 2020-07-07 MED ORDER — ADULT MULTIVITAMIN W/MINERALS CH
1.0000 | ORAL_TABLET | Freq: Every day | ORAL | 0 refills | Status: DC
Start: 2020-07-08 — End: 2020-08-14

## 2020-07-07 MED ORDER — CEPHALEXIN 500 MG PO CAPS: 500.0000 mg | ORAL_CAPSULE | Freq: Three times a day (TID) | ORAL | 0 refills | Status: AC

## 2020-07-07 MED ORDER — HYDROCODONE-ACETAMINOPHEN 5-325 MG PO TABS: 1.0000 | ORAL_TABLET | Freq: Four times a day (QID) | ORAL | 0 refills | Status: DC | PRN

## 2020-07-07 MED ORDER — ENSURE MAX PROTEIN PO LIQD
11.0000 [oz_av] | Freq: Two times a day (BID) | ORAL | 0 refills | Status: DC
Start: 2020-07-07 — End: 2020-08-14

## 2020-07-07 MED ORDER — SODIUM CHLORIDE 0.9% FLUSH: 10.0000 mL | Freq: Every day | INTRAVENOUS | 0 refills | Status: DC

## 2020-07-07 MED ORDER — PANTOPRAZOLE SODIUM 40 MG PO TBEC
40.0000 mg | DELAYED_RELEASE_TABLET | Freq: Every day | ORAL | 0 refills | Status: DC
Start: 2020-07-08 — End: 2020-08-14

## 2020-07-07 MED ORDER — SODIUM CHLORIDE 0.9% FLUSH: 10.0000 mL | Freq: Three times a day (TID) | INTRAVENOUS | 2 refills | Status: DC

## 2020-07-07 NOTE — TOC Transition Note (Signed)
Transition of Care [facility]) - CM/SW Discharge Note   Patient Details  Name: Carrie Hardy MRN: [id number] Date of Birth: [birth year]  Transition of Care [facility]) CM/SW Contact:  Meriel Flavors, LCSW Phone Number: 07/07/2020, 3:00 PM   Clinical Narrative:    CSW received message patient needs HH/PT set up and a walker. CSW called patient to discuss discharge plan. Patient states her husband has already bought a walker for her. CSW is trying to find an agency with available staff to provide HH/PT and will call patient with results as soon as possible. Due to type of insurance, CSW may have to refer patient for outpatient PT         Patient Goals and CMS Choice        Discharge Placement                       Discharge Plan and Services                                     Social Determinants of Health (SDOH) Interventions     Readmission Risk Interventions No flowsheet data found.

## 2020-07-07 NOTE — Progress Notes (Signed)
History of acute calculous cholecystitis s/p percutaneous cholecystostomy tube placement in IR 06/28/2020.  If patient is to be discharged, below are discharge instructions: - Flush drain once daily with 5-10 cc NS flush (patient will need an order for flushes upon discharge). RN to teach patient how to flush/empty drain. - Record output from each drain once daily. - No submerging or showering with drain in place. Recommend sponge bathing around drain site. - Follow-up at [facility] in 6-8 weeks for routine cholecystostomy tube exchange unless cholecystectomy occurred in the interim (IR schedulers to call patient to set up this appointment).  Please call IR with questions/concerns.   Bea Graff Acquanetta [place]anilla, PA-C 07/07/2020, 1:12 PM

## 2020-07-07 NOTE — Progress Notes (Signed)
[facility]  ROUNDING NOTE   Subjective:  She is sitting up in bed, in no acute distress.  No reported shortness of breath, nausea, and vomiting.  Cholecystotomy drain with yellowish-green output, denies abdominal discomfort or pain. Renal function improving. Recorded urine output for the preceding 24 hours is 850 mL   Objective:  Vital signs in last 24 hours:  Temp:  [97.9 F (36.6 C)-99.2 F (37.3 C)] 97.9 F (36.6 C) (10/29 1207) Pulse Rate:  [82-92] 82 (10/29 1207) Resp:  [18-20] 18 (10/29 1207) BP: (116-133)/(77-90) 133/84 (10/29 1207) SpO2:  [96 %-98 %] 96 % (10/29 1207) Weight:  [103.7 kg] 103.7 kg (10/29 0500)  Weight change: -0.854 kg Filed Weights   07/05/20 0600 07/06/20 0418 07/07/20 0500  Weight: 103.2 kg 104.6 kg 103.7 kg    Intake/Output: I/O last 3 completed shifts: In: 87 [P.O.:760; I.V.:0.1; IV Piggyback:129.9] Out: 2440 [Urine:2190; Drains:250]   Intake/Output this shift:  Total I/O In: 480 [P.O.:480] Out: 200 [Other:200]  Physical Exam: General:  Resting in bed, in no acute distress  Head:  Moist oral mucosal membranes  Eyes:  Sclerae and conjunctivae clear  Lungs:    lungs clear, respirations even and unlabored  Heart  regular rate and rhythm  Abdomen:   cholecystostomy drain in place, draining moderate amount of greenish yellow colored output  Extremities:  1+ peripheral edema  Neurologic:  Oriented x3  Skin:  No acute lesions or rashes    Basic Metabolic Panel: Recent Labs  Lab 07/02/20 0616 07/02/20 0616 07/03/20 0500 07/03/20 0500 07/04/20 0609 07/04/20 0609 07/05/20 1004 07/06/20 0606 07/07/20 0545  NA 135   < > 136  --  134*  --  136 137 142  K 3.6   < > 3.3*  --  3.6  --  2.8* 3.3* 3.6  CL 106   < > 107  --  105  --  104 107 110  CO2 17*   < > 17*  --  17*  --  20* 21* 21*  GLUCOSE 130*   < > 121*  --  122*  --  125* 78 83  BUN 47*   < > 49*  --  52*  --  56* 53* 50*  CREATININE 3.22*   < > 3.27*  --  3.30*   --  3.03* 2.79* 2.55*  CALCIUM 7.4*   < > 7.4*   < > 8.0*   < > 8.0* 7.9* 8.4*  MG 2.0  --   --   --  1.9  --   --   --   --   PHOS  --   --   --   --  5.9*  --  5.4*  --   --    < > = values in this interval not displayed.    Liver Function Tests: Recent Labs  Lab 07/01/20 0625 07/03/20 0500 07/04/20 0609 07/05/20 1004  AST 201* 59*  --   --   ALT 172* 95*  --   --   ALKPHOS 198* 183*  --   --   BILITOT 2.3* 1.2  --   --   PROT 6.2* 5.7*  --   --   ALBUMIN 2.8* 2.5* 2.4* 2.5*   No results for input(s): LIPASE, AMYLASE in the last 168 hours. No results for input(s): AMMONIA in the last 168 hours.  CBC: Recent Labs  Lab 07/01/20 3976 07/02/20 7341 07/03/20 0501 07/04/20 9379 07/06/20 0606  WBC 9.8 8.9 10.3 12.8* 12.3*  NEUTROABS 8.5* 7.2 8.3* 10.6* 10.0*  HGB 9.9* 9.6* 10.5* 11.2* 10.9*  HCT 27.3* 27.3* 29.5* 31.0* 30.2*  MCV 102.6* 102.2* 101.0* 99.7 101.0*  PLT 83* 86* 144* 214 276    Cardiac Enzymes: No results for input(s): CKTOTAL, CKMB, CKMBINDEX, TROPONINI in the last 168 hours.  BNP: Invalid input(s): POCBNP  CBG: Recent Labs  Lab 07/06/20 0758 07/06/20 1545 07/07/20 0010 07/07/20 0805 07/07/20 1206  GLUCAP 77 80 84 76 90    Microbiology: Results for orders placed or performed during the hospital encounter of 06/27/20  Blood culture (routine single)     Status: Abnormal   Collection Time: 06/27/20  7:51 PM   Specimen: BLOOD  Result Value Ref Range Status   Specimen Description   Final    BLOOD LEFT ANTECUBITAL Performed at [facility], [address], [place], [place] [address]    Special Requests   Final    BOTTLES DRAWN AEROBIC AND ANAEROBIC Blood Culture adequate volume Performed at [facility], [address] [place], [place] [address]    Culture  Setup Time   Final    Organism ID to follow GRAM NEGATIVE RODS IN BOTH AEROBIC AND ANAEROBIC BOTTLES CRITICAL RESULT CALLED TO, READ BACK BY AND VERIFIED  WITHLloyd Huger AT [id number] 06/28/20 [place] Performed at [facility], [address] [place], [place] [address]    Culture ESCHERICHIA COLI (A)  Final   Report Status 06/30/2020 FINAL  Final   Organism ID, Bacteria ESCHERICHIA COLI  Final      Susceptibility   Escherichia coli - MIC*    AMPICILLIN >=32 RESISTANT Resistant     CEFAZOLIN <=4 SENSITIVE Sensitive     CEFEPIME <=0.12 SENSITIVE Sensitive     CEFTAZIDIME <=1 SENSITIVE Sensitive     CEFTRIAXONE <=0.25 SENSITIVE Sensitive     CIPROFLOXACIN <=0.25 SENSITIVE Sensitive     GENTAMICIN <=1 SENSITIVE Sensitive     IMIPENEM <=0.25 SENSITIVE Sensitive     TRIMETH/SULFA <=20 SENSITIVE Sensitive     AMPICILLIN/SULBACTAM >=32 RESISTANT Resistant     PIP/TAZO <=4 SENSITIVE Sensitive     * ESCHERICHIA COLI  Respiratory Panel by RT PCR (Flu A&B, Covid) - Nasopharyngeal Swab     Status: None   Collection Time: 06/27/20  7:51 PM   Specimen: Nasopharyngeal Swab  Result Value Ref Range Status   SARS Coronavirus 2 by RT PCR NEGATIVE NEGATIVE Final    Comment: (NOTE) SARS-CoV-2 target nucleic acids are NOT DETECTED.  The SARS-CoV-2 RNA is generally detectable in upper respiratoy specimens during the acute phase of infection. The lowest concentration of SARS-CoV-2 viral copies this assay can detect is 131 copies/mL. A negative result does not preclude SARS-Cov-2 infection and should not be used as the sole basis for treatment or other patient management decisions. A negative result may occur with  improper specimen collection/handling, submission of specimen other than nasopharyngeal swab, presence of viral mutation(s) within the areas targeted by this assay, and inadequate number of viral copies (<131 copies/mL). A negative result must be combined with clinical observations, patient history, and epidemiological information. The expected result is Negative.  Fact Sheet for Patients:   PinkCheek.be  Fact Sheet for Healthcare Providers:  GravelBags.it  This test is no t yet approved or cleared by the [place] FDA and  has been authorized for detection and/or diagnosis of SARS-CoV-2 by FDA under an Emergency Use Authorization (EUA). This EUA will remain  in effect (meaning this test can be used) for the duration of the COVID-19 declaration under Section 564(b)(1) of the Act, 21 U.S.C. section 360bbb-3(b)(1), unless the authorization is terminated or revoked sooner.     Influenza A by PCR NEGATIVE NEGATIVE Final   Influenza B by PCR NEGATIVE NEGATIVE Final    Comment: (NOTE) The Xpert Xpress SARS-CoV-2/FLU/RSV assay is intended as an aid in  the diagnosis of influenza from Nasopharyngeal swab specimens and  should not be used as a sole basis for treatment. Nasal washings and  aspirates are unacceptable for Xpert Xpress SARS-CoV-2/FLU/RSV  testing.  Fact Sheet for Patients: PinkCheek.be  Fact Sheet for Healthcare Providers: GravelBags.it  This test is not yet approved or cleared by the [place] FDA and  has been authorized for detection and/or diagnosis of SARS-CoV-2 by  FDA under an Emergency Use Authorization (EUA). This EUA will remain  in effect (meaning this test can be used) for the duration of the  Covid-19 declaration under Section 564(b)(1) of the Act, 21  U.S.C. section 360bbb-3(b)(1), unless the authorization is  terminated or revoked. Performed at [facility], [address] [place], [place] [address]   Blood Culture ID Panel (Reflexed)     Status: Abnormal   Collection Time: 06/27/20  7:51 PM  Result Value Ref Range Status   Enterococcus faecalis NOT DETECTED NOT DETECTED Final   Enterococcus Faecium NOT DETECTED NOT DETECTED Final   Listeria monocytogenes NOT DETECTED NOT DETECTED Final   Staphylococcus  species NOT DETECTED NOT DETECTED Final   Staphylococcus aureus (BCID) NOT DETECTED NOT DETECTED Final   Staphylococcus epidermidis NOT DETECTED NOT DETECTED Final   Staphylococcus lugdunensis NOT DETECTED NOT DETECTED Final   Streptococcus species NOT DETECTED NOT DETECTED Final   Streptococcus agalactiae NOT DETECTED NOT DETECTED Final   Streptococcus pneumoniae NOT DETECTED NOT DETECTED Final   Streptococcus pyogenes NOT DETECTED NOT DETECTED Final   A.calcoaceticus-baumannii NOT DETECTED NOT DETECTED Final   Bacteroides fragilis NOT DETECTED NOT DETECTED Final   Enterobacterales DETECTED (A) NOT DETECTED Final    Comment: Enterobacterales represent a large order of gram negative bacteria, not a single organism. CRITICAL RESULT CALLED TO, READ BACK BY AND VERIFIED WITH:  NATHAN BELUE AT 5329 06/28/20 SDR    Enterobacter cloacae complex NOT DETECTED NOT DETECTED Final   Escherichia coli DETECTED (A) NOT DETECTED Final    Comment: CRITICAL RESULT CALLED TO, READ BACK BY AND VERIFIED WITH:  NATHAN BELUE AT 9242 06/28/20 SDR    Klebsiella aerogenes NOT DETECTED NOT DETECTED Final   Klebsiella oxytoca NOT DETECTED NOT DETECTED Final   Klebsiella pneumoniae NOT DETECTED NOT DETECTED Final   Proteus species NOT DETECTED NOT DETECTED Final   Salmonella species NOT DETECTED NOT DETECTED Final   Serratia marcescens NOT DETECTED NOT DETECTED Final   Haemophilus influenzae NOT DETECTED NOT DETECTED Final   Neisseria meningitidis NOT DETECTED NOT DETECTED Final   Pseudomonas aeruginosa NOT DETECTED NOT DETECTED Final   Stenotrophomonas maltophilia NOT DETECTED NOT DETECTED Final   Candida albicans NOT DETECTED NOT DETECTED Final   Candida auris NOT DETECTED NOT DETECTED Final   Candida glabrata NOT DETECTED NOT DETECTED Final   Candida krusei NOT DETECTED NOT DETECTED Final   Candida parapsilosis NOT DETECTED NOT DETECTED Final   Candida tropicalis NOT DETECTED NOT DETECTED Final    Cryptococcus neoformans/gattii NOT DETECTED NOT DETECTED Final   CTX-M ESBL NOT DETECTED NOT DETECTED Final   Carbapenem resistance IMP NOT  DETECTED NOT DETECTED Final   Carbapenem resistance KPC NOT DETECTED NOT DETECTED Final   Carbapenem resistance NDM NOT DETECTED NOT DETECTED Final   Carbapenem resist OXA 48 LIKE NOT DETECTED NOT DETECTED Final   Carbapenem resistance VIM NOT DETECTED NOT DETECTED Final    Comment: Performed at [facility], [address] [place], [place] [address]  MRSA PCR Screening     Status: None   Collection Time: 06/28/20  5:21 AM   Specimen: Nasopharyngeal  Result Value Ref Range Status   MRSA by PCR NEGATIVE NEGATIVE Final    Comment:        The GeneXpert MRSA Assay (FDA approved for NASAL specimens only), is one component of a comprehensive MRSA colonization surveillance program. It is not intended to diagnose MRSA infection nor to guide or monitor treatment for MRSA infections. Performed at [facility], [address], [place], [place] [address]   Aerobic/Anaerobic Culture (surgical/deep wound)     Status: None   Collection Time: 06/28/20 11:02 AM   Specimen: Fluid; Abscess  Result Value Ref Range Status   Specimen Description   Final    FLUID Performed at [facility], [address], [place], [place] [address]    Special Requests   Final    gallbladder fluid Performed at [facility], [place], [place] [address]    Gram Stain   Final    FEW WBC PRESENT,BOTH PMN AND MONONUCLEAR MODERATE GRAM POSITIVE RODS    Culture   Final    FEW ESCHERICHIA COLI RARE KLEBSIELLA OXYTOCA NO ANAEROBES ISOLATED Performed at [facility], [address], [place], [place] [address]    Report Status 07/03/2020 FINAL  Final   Organism ID, Bacteria ESCHERICHIA COLI  Final   Organism ID, Bacteria KLEBSIELLA OXYTOCA  Final      Susceptibility   Escherichia coli - MIC*    AMPICILLIN >=32  RESISTANT Resistant     CEFAZOLIN <=4 SENSITIVE Sensitive     CEFEPIME <=0.12 SENSITIVE Sensitive     CEFTAZIDIME <=1 SENSITIVE Sensitive     CEFTRIAXONE <=0.25 SENSITIVE Sensitive     CIPROFLOXACIN <=0.25 SENSITIVE Sensitive     GENTAMICIN <=1 SENSITIVE Sensitive     IMIPENEM <=0.25 SENSITIVE Sensitive     TRIMETH/SULFA <=20 SENSITIVE Sensitive     AMPICILLIN/SULBACTAM 16 INTERMEDIATE Intermediate     PIP/TAZO <=4 SENSITIVE Sensitive     * FEW ESCHERICHIA COLI   Klebsiella oxytoca - MIC*    AMPICILLIN >=32 RESISTANT Resistant     CEFAZOLIN <=4 SENSITIVE Sensitive     CEFEPIME <=0.12 SENSITIVE Sensitive     CEFTAZIDIME <=1 SENSITIVE Sensitive     CEFTRIAXONE <=0.25 SENSITIVE Sensitive     CIPROFLOXACIN <=0.25 SENSITIVE Sensitive     GENTAMICIN <=1 SENSITIVE Sensitive     IMIPENEM <=0.25 SENSITIVE Sensitive     TRIMETH/SULFA <=20 SENSITIVE Sensitive     AMPICILLIN/SULBACTAM 8 SENSITIVE Sensitive     PIP/TAZO <=4 SENSITIVE Sensitive     * RARE KLEBSIELLA OXYTOCA    Coagulation Studies: No results for input(s): LABPROT, INR in the last 72 hours.  Urinalysis: No results for input(s): COLORURINE, LABSPEC, PHURINE, GLUCOSEU, HGBUR, BILIRUBINUR, KETONESUR, PROTEINUR, UROBILINOGEN, NITRITE, LEUKOCYTESUR in the last 72 hours.  Invalid input(s): APPERANCEUR    Imaging: No results found.   Medications:   . sodium chloride 250 mL (07/05/20 1843)  . cefTRIAXone (ROCEPHIN)  IV 2 g (07/06/20 1653)   . Chlorhexidine  Gluconate Cloth  6 each Topical Q0600  . heparin injection (subcutaneous)  5,000 Units Subcutaneous Q8H  . multivitamin with minerals  1 tablet Oral Daily  . pantoprazole  40 mg Oral Daily  . potassium chloride  20 mEq Oral BID  . Ensure Max Protein  11 oz Oral BID  . sodium chloride flush  5 mL Intracatheter Q8H   alum & mag hydroxide-simeth, docusate sodium, HYDROcodone-acetaminophen, loperamide, morphine injection, ondansetron (ZOFRAN) IV, polyethylene  glycol  Assessment/ Plan:  59 y.o. female with past medical history of hypertension, hyperlipidemia, nonalcoholic fatty liver disease who presented with cholelithiasis and gallbladder wall thickening consistent with cholecystitis.  1.  Acute kidney injury secondary to ATN.   Baseline creatinine 0.7.     Lab Results  Component Value Date   CREATININE 2.55 (H) 07/07/2020   CREATININE 2.79 (H) 07/06/2020   CREATININE 3.03 (H) 07/05/2020  Patient's renal function is improving slowly and gradually Urine output for the preceding 24 hours is 850 mL No acute indication for dialysis Nephrology can follow up as outpatient if getting discharged  2.  Metabolic acidosis.   Bicarbonate levels stays stable  3.  Hypokalemia.   Potassium normalized to 3.6 today We will continue monitoring   LOS: [place] 10/29/20213:58 PM

## 2020-07-07 NOTE — Discharge Summary (Signed)
Physician Discharge Summary  RENLEIGH OUELLET AJO:[id number] DOB: Jan 06, 1961 DOA: 06/27/2020  PCP: Ricardo Jericho, NP  Admit date: 06/27/2020 Discharge date: 07/07/2020  Recommendations for Outpatient Follow-up:  1. Discharge to home with home health PT/OT/RN 2. Drain care as per interventional radiology instructions. 3. Follow up with general surgery in 2 months. 4. Follow up with nephrology in 2 weeks. 5. Follow up with interventional radiology in 6-8 weeks. 6. Follow up with PCP in 7-10 days. 7. Complete antibiotics. 8. No drivign while taking hydrocodone.   Follow-up Information    Pabon, [place] F, MD Follow up in 2 week(s).   Specialty: General Surgery Contact information: [address] [place] [address] [phone]               Discharge Diagnoses: Principal diagnosis is #1 1. Severe sepsis due to cholecystitis 2. AKI 3. Hyperglycemia due to steroids and D5 4. Hyperlipidemia 5. Hypertension 6. Debility  Discharge Condition: Fair  Disposition: Home with home health PT/OT/RN  Diet recommendation: Heart healthy  Filed Weights   07/05/20 0600 07/06/20 0418 07/07/20 0500  Weight: 103.2 kg 104.6 kg 103.7 kg    History of present illness: 59 yo female presenting to the ED after waking up on 06/27/20 and feeling lightheaded with some slurred speech and one episode of nausea.  She reports being in her normal state of health up to this morning, denies fever/ chills/ vomiting/ diarrhea/ shortness of breath/ fatigue/weight loss or gain.  Upon arrival to the ED patient was hypotensive: 70/50, tachycardic, maintaining SpO2 on room air. ED completed sepsis protocol and workup included labs significant for: serum CO2 19, Cr. 1.88, AG 16, Alk Phos 329, AST 6,003, ALT 731, Total bili 1.9, WBC 1.1, Lactic 5.2 ~ 6.6.  Abdominal US showed cholelithiasis and concerns for cholecystitis. Dr. Joni Fears discussed case with Dr. Dahlia Byes in general surgery  with recommendations for CT abdomen, results showing gallbladder distention with multiple gallstones.  CT results relayed to Dr. Dahlia Byes with further recommendations for MRCP and GI involvement with biliary drain placement.  Patient requiring low dose levophed drip.  PCCM consulted for further management, admitted to ICU.  Hospital Course: KATRINIA STRAKER a 59 y.o.femalewith a history of hypertension, hyperlipidemiawho came in withfeeling lightheaded. Found to have a low blood pressure of about 70/50. She denies any recent pain, denies vomiting or diarrhea, reports eating and drinking normally. Patient was admitted with severe sepsis suspected due to cholecystitis on workup in the ER.  The patient was admitted to the ICU. She required pressors and aggressive IV Fluid resuscitation. Blood cultures grew out E. Coli. She will require 2 weeks total of IV Rocephin. She was transferred out of the ICU to the hospitalist service on 07/03/2020. Dr. Adora Fridge saw the patient for general surgery. The plan is for the patient to follow up with him as outpatient for definitive treatment of gallbladder disease.  Cholecystostomy tube was placed by IR on 06/28/2020. In the ICU she had a femoral line that was removed on 07/04/2020. She is being tapered off of stress doses of steroids. She has been advanced to a full liquid diet.  Today the patient has completed 11 days of IV Rocephin to cover for the patient's E colli bacteremia and Kelbsiella from cholecystic fluid. After consultation with pharmacy, the patient will be transitioned to Keflex to complete her antibiotics.  The patient will be discharged to home with home health PT/OT/RN.  IR was contacted and instructions for drain care  obtained:  Flush drain once daily with 5-10 cc NS flush (patient will need an order for flushes upon discharge). RN to teach patient how to flush/empty drain. - Record output from each drain once daily. - No submerging or  showering with drain in place. Recommend sponge bathing around drain site. - [facility]up at [facility] in 6-8 weeks for routine cholecystostomy tube exchange unless cholecystectomy occurred in the interim (IR schedulers to call patient to set up this appointment).  Today's assessment: S: The patient is resting comfortably. No new complaints.  O: Vitals:  Vitals:   07/07/20 0341 07/07/20 1207  BP: 118/90 133/84  Pulse: 82 82  Resp: 20 18  Temp: 98.8 F (37.1 C) 97.9 F (36.6 C)  SpO2: 98% 96%   Exam:  Constitutional:   The patient is awake, alert, and oriented x 3. No acute distress. Respiratory:   No increased work of breathing.  No wheezes, rales, or rhonchi  No tactile fremitus Cardiovascular:   Regular rate and rhythm  No murmurs, ectopy, or gallups.  No lateral PMI. No thrills. Abdomen:   Abdomen is soft, non-tender, non-distended  No hernias, masses, or organomegaly  Normoactive bowel sounds.  Musculoskeletal:   No cyanosis, clubbing, or edema Skin:   No rashes, lesions, ulcers  palpation of skin: no induration or nodules Neurologic:   CN 2-12 intact  Sensation all 4 extremities intact Psychiatric:   Mental status ? Mood, affect appropriate ? Orientation to person, place, time   judgment and insight appear intact  Discharge Instructions  Discharge Instructions    Activity as tolerated - No restrictions   Complete by: As directed    Call MD for:  persistant nausea and vomiting   Complete by: As directed    Call MD for:  severe uncontrolled pain   Complete by: As directed    Call MD for:  temperature >100.4   Complete by: As directed    Culture, blood (routine x 2)   Complete by: As directed    Diet - low sodium heart healthy   Complete by: As directed    Discharge instructions   Complete by: As directed    Discharge to home with home health PT/OT and RN. Follow up with general surgery in 1 month. Follow up with nephrology in 1-2  weeks. Follow up with interventional radiology in 6-8 weeks for exchange of drain. Irrigate drain and measure output daily as per instructions from interventional radiology. Follow up with PCP in 7-10 days. No driving while taking hydrocodone.   Drain care   Complete by: As directed    Flush Drain: Yes   Flush amount: 5 cc   Flush frequency: tid   Increase activity slowly   Complete by: As directed    No wound care   Complete by: As directed      Allergies as of 07/07/2020      Reactions   Tramadol Itching      Medication List    STOP taking these medications   amLODipine 10 MG tablet Commonly known as: NORVASC   atorvastatin 40 MG tablet Commonly known as: LIPITOR   metoprolol succinate 50 MG 24 hr tablet Commonly known as: TOPROL-XL     TAKE these medications   cephALEXin 500 MG capsule Commonly known as: KEFLEX Take 1 capsule (500 mg total) by mouth 3 (three) times daily for 5 days.   Ensure Max Protein Liqd Take 330 mLs (11 oz total) by mouth 2 (two) times daily.  HYDROcodone-acetaminophen 5-325 MG tablet Commonly known as: NORCO/VICODIN Take 1 tablet by mouth every 6 (six) hours as needed for moderate pain or severe pain.   multivitamin with minerals Tabs tablet Take 1 tablet by mouth daily. Start taking on: July 08, 2020   pantoprazole 40 MG tablet Commonly known as: PROTONIX Take 1 tablet (40 mg total) by mouth daily. Start taking on: July 08, 2020   potassium chloride SA 20 MEQ tablet Commonly known as: KLOR-CON Take 1 tablet (20 mEq total) by mouth daily.   sodium chloride flush 0.9 % Soln Commonly known as: NS Inject 10 mLs into the vein every 8 (eight) hours.            Durable Medical Equipment  (From admission, onward)         Start     Ordered   07/07/20 1243  DME Walker  Once       Question Answer Comment  Walker: With 5 Inch Wheels   Patient needs a walker to treat with the following condition Ambulatory dysfunction       07/07/20 1317         Allergies  Allergen Reactions   Tramadol Itching    The results of significant diagnostics from this hospitalization (including imaging, microbiology, ancillary and laboratory) are listed below for reference.    Significant Diagnostic Studies: CT HEAD WO CONTRAST  Result Date: 06/27/2020 CLINICAL DATA:  Lightheaded. EXAM: CT HEAD WITHOUT CONTRAST TECHNIQUE: Contiguous axial images were obtained from the base of the skull through the vertex without intravenous contrast. COMPARISON:  None. FINDINGS: Brain: There is mild cerebral atrophy with widening of the extra-axial spaces and ventricular dilatation. There are areas of decreased attenuation within the white matter tracts of the supratentorial brain, consistent with microvascular disease changes. Vascular: No hyperdense vessel or unexpected calcification. Skull: Normal. Negative for fracture or focal lesion. Sinuses/Orbits: No acute finding. Other: None. IMPRESSION: No acute intracranial pathology. Electronically Signed   By: Virgina Norfolk M.D.   On: 06/27/2020 18:10   [place] Intraoperative  Result Date: 06/28/2020 INDICATION: Acute cholecystitis. Poor operative candidate. Please perform image guided cholecystostomy tube placement for infection source control purposes. EXAM: ULTRASOUND AND FLUOROSCOPIC-GUIDED CHOLECYSTOSTOMY TUBE PLACEMENT COMPARISON:  CT abdomen and pelvis-06/27/2020 MEDICATIONS: The patient is currently admitted to the hospital and on intravenous antibiotics. Antibiotics were administered within an appropriate time frame prior to skin puncture. ANESTHESIA/SEDATION: Moderate (conscious) sedation was employed during this procedure. A total of Versed 0.5 mg and Fentanyl 25 mcg was administered intravenously. Moderate Sedation Time: 15 minutes. The patient's level of consciousness and vital signs were monitored continuously by radiology nursing throughout the procedure under my direct supervision.  CONTRAST:  None FLUOROSCOPY TIME:  Not provided COMPLICATIONS: None immediate. PROCEDURE: Informed written consent was obtained from the patient after a discussion of the risks, benefits and alternatives to treatment. Questions regarding the procedure were encouraged and answered. A timeout was performed prior to the initiation of the procedure. Patient was placed supine on the CT gantry with CT imaging demonstrating distension of the gallbladder with rather extensive amount pericholecystic fluid and layering echogenic gallstones or biliary sludge. The table position was marked and the gallbladder was identified sonographically. The right upper abdominal quadrant was prepped and draped in the usual sterile fashion, and a sterile drape was applied covering the operative field. Maximum barrier sterile technique with sterile gowns and gloves were used for the procedure. A timeout was performed prior to the initiation of the procedure.  Local anesthesia was provided with 1% lidocaine with epinephrine. Ultrasound scanning of the right upper quadrant demonstrates a markedly dilated gallbladder with significant amount pericholecystic fluid. Of note, the patient reported pain with ultrasound imaging over the gallbladder. Utilizing a transhepatic approach, an 18 gauge needle was advanced into the gallbladder under direct ultrasound guidance. An ultrasound image was saved for documentation purposes. A short Amplatz wire was coiled within the gallbladder lumen. Appropriate positioning was confirmed with CT imaging. The track was dilated allowing placement of a 10 French percutaneous drainage catheter with end coiled and locked within the gallbladder lumen. At this point, approximately 80 cc of bile was aspirated from the gallbladder. A small amount of aspirated bile was capped sent to laboratory for analysis. The cholecystostomy tube was flushed with a small amount of saline and connected to a gravity bag. Obscured place within  interrupted suture and a Stat Lock device. A dressing was applied. The patient tolerated the procedure well without immediate postprocedural complication. IMPRESSION: Successful ultrasound and CT guided placement of a 10.2 French cholecystostomy tube. A small amount of aspirated bile was sent to the laboratory for analysis. Electronically Signed   By: Sandi Mariscal M.D.   On: 06/28/2020 11:41   CT ABDOMEN PELVIS W CONTRAST  Result Date: 06/27/2020 CLINICAL DATA:  Elevated LFTs EXAM: CT ABDOMEN AND PELVIS WITH CONTRAST TECHNIQUE: Multidetector CT imaging of the abdomen and pelvis was performed using the standard protocol following bolus administration of intravenous contrast. CONTRAST:  71m OMNIPAQUE IOHEXOL 300 MG/ML  SOLN COMPARISON:  Ultrasound from earlier in the same day, CT from 03/27/2016. FINDINGS: Lower chest: No acute abnormality. Hepatobiliary: Fatty infiltration of the liver is noted. The gallbladder is well distended with multiple dependent gallstones. Mild pericholecystic inflammatory changes are noted similar to that seen on prior ultrasound. No biliary ductal dilatation is seen. Pancreas: Unremarkable. No pancreatic ductal dilatation or surrounding inflammatory changes. Spleen: Normal in size without focal abnormality. Adrenals/Urinary Tract: Adrenal glands are within normal limits. Kidneys demonstrate a normal enhancement pattern bilaterally. No renal calculi or obstructive changes are seen. Delayed images demonstrate no significant excretion although this is expected given the patient's poor renal function. Bladder is partially distended. Stomach/Bowel: No obstructive or inflammatory changes of the colon are seen. The appendix is within normal limits. Small bowel shows some postsurgical changes. No obstructive changes are noted. Postsurgical changes are noted in normal stomach as well consistent with prior bariatric surgery. Vascular/Lymphatic: Aortic atherosclerosis. No enlarged abdominal or  pelvic lymph nodes. Reproductive: Uterus is well visualized and demonstrates a central hypodense mass lesion. This likely represents a degenerating fibroid given the more diffuse enhancement on prior CT examination. Other: No abdominal wall hernia or abnormality. No abdominopelvic ascites. Musculoskeletal: Degenerative changes of lumbar spine are noted. IMPRESSION: Dilated gallbladder with wall thickening and pericholecystic inflammatory change in cholelithiasis similar to that seen on prior ultrasound examination. Again HIDA scan may be helpful to assess for gallbladder function/cystic duct patency. Hypodense lesion within the uterus which measures approximately 3.3 cm. This likely represents a degenerating fibroid given the findings on prior CT. Chronic changes as described above. Electronically Signed   By: MInez CatalinaM.D.   On: 06/27/2020 23:22   [place]  Result Date: 07/05/2020 CLINICAL DATA:  Acute kidney failure EXAM: RENAL / URINARY TRACT ULTRASOUND COMPLETE COMPARISON:  Abdominal MRI June 28, 2020 FINDINGS: Right Kidney: Renal measurements: 11.8 x 6.4 x 6.5 cm = volume: 259.0 mL. Echogenicity and renal cortical thickness are within normal  limits. No hydronephrosis visualized. There is a cyst in the mid right kidney measuring 1.0 x 1.0 cm. There is perinephric fluid on the right. No sonographically demonstrable calculus or ureterectasis. Left Kidney: Renal measurements: 11.4 x 6.2 x 6.1 cm = volume: 277.3 mL. Echogenicity and renal cortical thickness are within normal limits. No mass or hydronephrosis visualized. There is slight perinephric fluid on the left. No sonographically demonstrable calculus or ureterectasis. Bladder: Appears normal for degree of bladder distention. Other: Slight ascites.  Left pleural effusion. IMPRESSION: 1. Perinephric fluid collections bilaterally, larger on the right than on the left. Etiology uncertain. 2. No obstructing focus in either kidney. Renal cortical  thickness and echogenicity within normal limits bilaterally. 3.  1 cm cyst mid right kidney. 4.  Left pleural effusion. 5.  Mild ascites. Electronically Signed   By: Lowella Grip III M.D.   On: 07/05/2020 11:34   MR 3D Recon At Scanner  Result Date: 06/29/2020 CLINICAL DATA:  59 year old female with history of jaundice. Cholelithiasis. EXAM: MRI ABDOMEN WITHOUT AND WITH CONTRAST (INCLUDING MRCP) TECHNIQUE: Multiplanar multisequence MR imaging of the abdomen was performed both before and after the administration of intravenous contrast. Heavily T2-weighted images of the biliary and pancreatic ducts were obtained, and three-dimensional MRCP images were rendered by post processing. CONTRAST:  81m GADAVIST GADOBUTROL 1 MMOL/ML IV SOLN COMPARISON:  No prior abdominal MRI. CT the abdomen and pelvis 06/27/2020. FINDINGS: Lower chest: Small right pleural effusion lying dependently with areas of probable passive atelectasis in the right lower lobe. Mild cardiomegaly. Hepatobiliary: In the periphery of segment 8 of the liver (axial image 25 of series 28) there is a 1.4 x 0.8 cm hypovascular region which is mildly T1 hypointense, mildly T2 hyperintense, and associated with small filling defects in the proximal aspect of the middle hepatic vein (axial image 21 of series 28). No other hepatic lesions are noted. No intra or extrahepatic biliary ductal dilatation noted on MRCP images. However, there is mild diffuse periportal edema. Interval placement of percutaneous cholecystostomy tube which resides within a decompressed gallbladder. Gallbladder wall appears mildly thickened but severely edematous. Common bile duct measures 3 mm in the porta hepatis. No filling defect in the common bile duct to suggest choledocholithiasis. Pancreas: No pancreatic mass. No pancreatic ductal dilatation. No pancreatic or peripancreatic fluid collections. Trace amount of peripancreatic fluid. Spleen:  Unremarkable. Adrenals/Urinary Tract:  Subcentimeter T1 hypointense, T2 hyperintense, nonenhancing lesions in both kidneys, compatible with simple cysts. Small amount of perinephric fluid bilaterally. No hydroureteronephrosis in the visualized portions of the abdomen. Bilateral adrenal glands are normal in appearance. Stomach/Bowel: Unremarkable. Vascular/Lymphatic: Aortic atherosclerosis, without evidence of aneurysm in the abdominal vasculature. No lymphadenopathy noted in the abdomen. Other: Small volume of ascites, most evident in the right upper quadrant, particularly adjacent to the gallbladder. Perinephric fluid bilaterally, as well as a small amount of peripancreatic fluid in the retroperitoneum. Musculoskeletal: No aggressive appearing osseous lesions are noted in the visualized portions of the skeleton. IMPRESSION: 1. Interval percutaneous cholecystostomy with decompression of the gallbladder. Previously noted gallstones are not confidently identified on today's examination. Gallbladder wall appears thickened and severely edematous with persistent pericholecystic fluid. 2. No choledocholithiasis or signs of biliary tract obstruction. 3. Periportal edema in the liver. 4. Edema in the retroperitoneum including both peripancreatic and perinephric fluid bilaterally. 5. Small right pleural effusion lying dependently with some associated passive subsegmental atelectasis in the right lower lobe. Electronically Signed   By: DVinnie LangtonM.D.   On: 06/29/2020  06:15   DG Chest Port 1 View  Result Date: 06/28/2020 CLINICAL DATA:  Respiratory failure. EXAM: PORTABLE CHEST 1 VIEW COMPARISON:  Chest x-ray 06/27/2020. FINDINGS: Heart size stable. Low lung volumes. Very mild bilateral interstitial prominence noted. Mild pneumonitis can not be excluded. Persistent mild bibasilar subsegmental atelectasis. No pleural effusion or pneumothorax. IMPRESSION: Low lung volumes. Very mild bilateral interstitial prominence noted. Mild pneumonitis can not be  excluded. Persistent mild bibasilar subsegmental atelectasis. Electronically Signed   By: Marcello Moores  Register   On: 06/28/2020 07:56   DG Chest Port 1 View  Result Date: 06/27/2020 CLINICAL DATA:  Possible sepsis EXAM: PORTABLE CHEST 1 VIEW COMPARISON:  None. FINDINGS: Minimal atelectasis left base. No consolidation or effusion. Normal heart size. Aortic atherosclerosis. No pneumothorax. IMPRESSION: No active disease. Electronically Signed   By: Donavan Foil M.D.   On: 06/27/2020 19:00   MR ABDOMEN MRCP W WO CONTAST  Result Date: 06/29/2020 CLINICAL DATA:  59 year old female with history of jaundice. Cholelithiasis. EXAM: MRI ABDOMEN WITHOUT AND WITH CONTRAST (INCLUDING MRCP) TECHNIQUE: Multiplanar multisequence MR imaging of the abdomen was performed both before and after the administration of intravenous contrast. Heavily T2-weighted images of the biliary and pancreatic ducts were obtained, and three-dimensional MRCP images were rendered by post processing. CONTRAST:  73m GADAVIST GADOBUTROL 1 MMOL/ML IV SOLN COMPARISON:  No prior abdominal MRI. CT the abdomen and pelvis 06/27/2020. FINDINGS: Lower chest: Small right pleural effusion lying dependently with areas of probable passive atelectasis in the right lower lobe. Mild cardiomegaly. Hepatobiliary: In the periphery of segment 8 of the liver (axial image 25 of series 28) there is a 1.4 x 0.8 cm hypovascular region which is mildly T1 hypointense, mildly T2 hyperintense, and associated with small filling defects in the proximal aspect of the middle hepatic vein (axial image 21 of series 28). No other hepatic lesions are noted. No intra or extrahepatic biliary ductal dilatation noted on MRCP images. However, there is mild diffuse periportal edema. Interval placement of percutaneous cholecystostomy tube which resides within a decompressed gallbladder. Gallbladder wall appears mildly thickened but severely edematous. Common bile duct measures 3 mm in the  porta hepatis. No filling defect in the common bile duct to suggest choledocholithiasis. Pancreas: No pancreatic mass. No pancreatic ductal dilatation. No pancreatic or peripancreatic fluid collections. Trace amount of peripancreatic fluid. Spleen:  Unremarkable. Adrenals/Urinary Tract: Subcentimeter T1 hypointense, T2 hyperintense, nonenhancing lesions in both kidneys, compatible with simple cysts. Small amount of perinephric fluid bilaterally. No hydroureteronephrosis in the visualized portions of the abdomen. Bilateral adrenal glands are normal in appearance. Stomach/Bowel: Unremarkable. Vascular/Lymphatic: Aortic atherosclerosis, without evidence of aneurysm in the abdominal vasculature. No lymphadenopathy noted in the abdomen. Other: Small volume of ascites, most evident in the right upper quadrant, particularly adjacent to the gallbladder. Perinephric fluid bilaterally, as well as a small amount of peripancreatic fluid in the retroperitoneum. Musculoskeletal: No aggressive appearing osseous lesions are noted in the visualized portions of the skeleton. IMPRESSION: 1. Interval percutaneous cholecystostomy with decompression of the gallbladder. Previously noted gallstones are not confidently identified on today's examination. Gallbladder wall appears thickened and severely edematous with persistent pericholecystic fluid. 2. No choledocholithiasis or signs of biliary tract obstruction. 3. Periportal edema in the liver. 4. Edema in the retroperitoneum including both peripancreatic and perinephric fluid bilaterally. 5. Small right pleural effusion lying dependently with some associated passive subsegmental atelectasis in the right lower lobe. Electronically Signed   By: DVinnie LangtonM.D.   On: 06/29/2020 06:15  ECHOCARDIOGRAM COMPLETE  Result Date: 07/02/2020    ECHOCARDIOGRAM REPORT   Patient Name:   KAMILLA HANDS Date of Exam: 07/02/2020 Medical Rec #:  [id number]         Height:       65.0 in  Accession #:    [id number]        Weight:       215.2 lb Date of Birth:  1961/08/08         BSA:          2.041 m Patient Age:    59 years          BP:           106/81 mmHg Patient Gender: F                 HR:           79 bpm. Exam Location:  [facility] Procedure: 2D Echo, Cardiac Doppler and Color Doppler Indications:     Cardiomyopathy-unspecified 425.9  History:         Patient has no prior history of Echocardiogram examinations.                  Risk Factors:Hypertension.  Sonographer:     Sherrie Sport RDCS (AE) Referring Phys:  [id number] CARMEN Veda Canning Diagnosing Phys: Nelva Bush MD IMPRESSIONS  1. Left ventricular ejection fraction, by estimation, is 35 to 40%. The left ventricle has moderately decreased function. The left ventricle demonstrates global hypokinesis. There is mild left ventricular hypertrophy. Left ventricular diastolic parameters are consistent with Grade I diastolic dysfunction (impaired relaxation).  2. Right ventricular systolic function is low normal. The right ventricular size is normal. There is normal pulmonary artery systolic pressure.  3. Left atrial size was severely dilated.  4. Right atrial size was mildly dilated.  5. The mitral valve is normal in structure. Mild to moderate mitral valve regurgitation. No evidence of mitral stenosis.  6. The aortic valve is tricuspid. Aortic valve regurgitation is mild. No aortic stenosis is present. FINDINGS  Left Ventricle: Left ventricular ejection fraction, by estimation, is 35 to 40%. The left ventricle has moderately decreased function. The left ventricle demonstrates global hypokinesis. The left ventricular internal cavity size was normal in size. There is mild left ventricular hypertrophy. Left ventricular diastolic parameters are consistent with Grade I diastolic dysfunction (impaired relaxation). Right Ventricle: The right ventricular size is normal. No increase in right ventricular wall thickness. Right ventricular systolic function is low  normal. There is normal pulmonary artery systolic pressure. Left Atrium: Left atrial size was severely dilated. Right Atrium: Right atrial size was mildly dilated. Pericardium: There is no evidence of pericardial effusion. Mitral Valve: The mitral valve is normal in structure. Mild mitral annular calcification. Mild to moderate mitral valve regurgitation. No evidence of mitral valve stenosis. Tricuspid Valve: The tricuspid valve is normal in structure. Tricuspid valve regurgitation is mild. Aortic Valve: The aortic valve is tricuspid. Aortic valve regurgitation is mild. No aortic stenosis is present. Aortic valve mean gradient measures 3.0 mmHg. Aortic valve peak gradient measures 5.3 mmHg. Aortic valve area, by VTI measures 2.43 cm. Pulmonic Valve: The pulmonic valve was grossly normal. Pulmonic valve regurgitation is not visualized. Aorta: The aortic root is normal in size and structure. Pulmonary Artery: The pulmonary artery is of normal size. Venous: The inferior vena cava was not well visualized. IAS/Shunts: The interatrial septum was not well visualized.  LEFT VENTRICLE PLAX 2D LVIDd:  4.86 cm      Diastology LVIDs:         3.93 cm      LV e' medial:    5.44 cm/s LV PW:         1.06 cm      LV E/e' medial:  11.3 LV IVS:        0.93 cm      LV e' lateral:   9.14 cm/s LVOT diam:     2.00 cm      LV E/e' lateral: 6.7 LV SV:         54 LV SV Index:   26 LVOT Area:     3.14 cm  LV Volumes (MOD) LV vol d, MOD A2C: 106.0 ml LV vol d, MOD A4C: 98.9 ml LV vol s, MOD A2C: 62.3 ml LV vol s, MOD A4C: 59.4 ml LV SV MOD A2C:     43.7 ml LV SV MOD A4C:     98.9 ml LV SV MOD BP:      39.8 ml RIGHT VENTRICLE RV Basal diam:  3.30 cm RV S prime:     10.40 cm/s TAPSE (M-mode): 2.2 cm LEFT ATRIUM              Index       RIGHT ATRIUM           Index LA diam:        4.90 cm  2.40 cm/m  RA Area:     16.90 cm LA Vol (A2C):   151.0 ml 74.00 ml/m RA Volume:   38.80 ml  19.01 ml/m LA Vol (A4C):   112.0 ml 54.89 ml/m LA  Biplane Vol: 133.0 ml 65.18 ml/m  AORTIC VALVE                   PULMONIC VALVE AV Area (Vmax):    2.05 cm    PV Vmax:        0.63 m/s AV Area (Vmean):   2.14 cm    PV Peak grad:   1.6 mmHg AV Area (VTI):     2.43 cm    RVOT Peak grad: 2 mmHg AV Vmax:           115.00 cm/s AV Vmean:          76.600 cm/s AV VTI:            0.221 m AV Peak Grad:      5.3 mmHg AV Mean Grad:      3.0 mmHg LVOT Vmax:         75.10 cm/s LVOT Vmean:        52.300 cm/s LVOT VTI:          0.171 m LVOT/AV VTI ratio: 0.77  AORTA Ao Root diam: 3.20 cm MITRAL VALVE               TRICUSPID VALVE MV Area (PHT): 3.83 cm    TR Peak grad:   26.4 mmHg MV Decel Time: 198 msec    TR Vmax:        257.00 cm/s MV E velocity: 61.30 cm/s MV A velocity: 78.80 cm/s  SHUNTS MV E/A ratio:  0.78        Systemic VTI:  0.17 m                            Systemic Diam: 2.00 cm Nelva Bush MD Electronically  signed by Nelva Bush MD Signature Date/Time: 07/02/2020/2:48:34 PM    Final    CT IMAGE GUIDED DRAINAGE BY PERCUTANEOUS CATHETER  Result Date: 06/28/2020 INDICATION: Acute cholecystitis. Poor operative candidate. Please perform image guided cholecystostomy tube placement for infection source control purposes. EXAM: ULTRASOUND AND FLUOROSCOPIC-GUIDED CHOLECYSTOSTOMY TUBE PLACEMENT COMPARISON:  CT abdomen and pelvis-06/27/2020 MEDICATIONS: The patient is currently admitted to the hospital and on intravenous antibiotics. Antibiotics were administered within an appropriate time frame prior to skin puncture. ANESTHESIA/SEDATION: Moderate (conscious) sedation was employed during this procedure. A total of Versed 0.5 mg and Fentanyl 25 mcg was administered intravenously. Moderate Sedation Time: 15 minutes. The patient's level of consciousness and vital signs were monitored continuously by radiology nursing throughout the procedure under my direct supervision. CONTRAST:  None FLUOROSCOPY TIME:  Not provided COMPLICATIONS: None immediate. PROCEDURE:  Informed written consent was obtained from the patient after a discussion of the risks, benefits and alternatives to treatment. Questions regarding the procedure were encouraged and answered. A timeout was performed prior to the initiation of the procedure. Patient was placed supine on the CT gantry with CT imaging demonstrating distension of the gallbladder with rather extensive amount pericholecystic fluid and layering echogenic gallstones or biliary sludge. The table position was marked and the gallbladder was identified sonographically. The right upper abdominal quadrant was prepped and draped in the usual sterile fashion, and a sterile drape was applied covering the operative field. Maximum barrier sterile technique with sterile gowns and gloves were used for the procedure. A timeout was performed prior to the initiation of the procedure. Local anesthesia was provided with 1% lidocaine with epinephrine. Ultrasound scanning of the right upper quadrant demonstrates a markedly dilated gallbladder with significant amount pericholecystic fluid. Of note, the patient reported pain with ultrasound imaging over the gallbladder. Utilizing a transhepatic approach, an 18 gauge needle was advanced into the gallbladder under direct ultrasound guidance. An ultrasound image was saved for documentation purposes. A short Amplatz wire was coiled within the gallbladder lumen. Appropriate positioning was confirmed with CT imaging. The track was dilated allowing placement of a 10 French percutaneous drainage catheter with end coiled and locked within the gallbladder lumen. At this point, approximately 80 cc of bile was aspirated from the gallbladder. A small amount of aspirated bile was capped sent to laboratory for analysis. The cholecystostomy tube was flushed with a small amount of saline and connected to a gravity bag. Obscured place within interrupted suture and a Stat Lock device. A dressing was applied. The patient tolerated  the procedure well without immediate postprocedural complication. IMPRESSION: Successful ultrasound and CT guided placement of a 10.2 French cholecystostomy tube. A small amount of aspirated bile was sent to the laboratory for analysis. Electronically Signed   By: Sandi Mariscal M.D.   On: 06/28/2020 11:41   US ABDOMEN LIMITED RUQ (LIVER/GB)  Result Date: 06/27/2020 CLINICAL DATA:  Elevated LFT EXAM: ULTRASOUND ABDOMEN LIMITED RIGHT UPPER QUADRANT COMPARISON:  CT 03/27/2016 FINDINGS: Gallbladder: Multiple shadowing stones in the gallbladder. Increased gallbladder wall thickness measuring 7.3 mm. Negative sonographic Murphy. Edematous appearing gallbladder wall with small amount of pericholecystic fluid. Common bile duct: Diameter: 3.6 mm Liver: Liver is echogenic. Previously described liver mass is not seen sonographically. Portal vein is patent on color Doppler imaging with normal direction of blood flow towards the liver. Other: Small amount of free fluid in the right upper quadrant IMPRESSION: 1. Cholelithiasis with gallbladder wall thickening/edema and small amount of pericholecystic fluid but negative sonographic Percell Miller, concern  is raised for cholecystitis despite negative sonographic Murphy. Correlation with nuclear medicine hepatobiliary imaging could be obtained for further evaluation 2. Diffusely echogenic liver consistent with steatosis and or hepatocellular disease 3. Small amount of right upper quadrant ascites Electronically Signed   By: Donavan Foil M.D.   On: 06/27/2020 22:51    Microbiology: Recent Results (from the past 240 hour(s))  Blood culture (routine single)     Status: Abnormal   Collection Time: 06/27/20  7:51 PM   Specimen: BLOOD  Result Value Ref Range Status   Specimen Description   Final    BLOOD LEFT ANTECUBITAL Performed at [facility], [address] [place], [place] [address]    Special Requests   Final    BOTTLES DRAWN AEROBIC AND ANAEROBIC Blood Culture  adequate volume Performed at [facility], [address] [place], [place] [address]    Culture  Setup Time   Final    Organism ID to follow GRAM NEGATIVE RODS IN BOTH AEROBIC AND ANAEROBIC BOTTLES CRITICAL RESULT CALLED TO, READ BACK BY AND VERIFIED WITHLloyd Huger AT [id number] 06/28/20 [place] Performed at [facility], [address] [place], [place] [address]    Culture ESCHERICHIA COLI (A)  Final   Report Status 06/30/2020 FINAL  Final   Organism ID, Bacteria ESCHERICHIA COLI  Final      Susceptibility   Escherichia coli - MIC*    AMPICILLIN >=32 RESISTANT Resistant     CEFAZOLIN <=4 SENSITIVE Sensitive     CEFEPIME <=0.12 SENSITIVE Sensitive     CEFTAZIDIME <=1 SENSITIVE Sensitive     CEFTRIAXONE <=0.25 SENSITIVE Sensitive     CIPROFLOXACIN <=0.25 SENSITIVE Sensitive     GENTAMICIN <=1 SENSITIVE Sensitive     IMIPENEM <=0.25 SENSITIVE Sensitive     TRIMETH/SULFA <=20 SENSITIVE Sensitive     AMPICILLIN/SULBACTAM >=32 RESISTANT Resistant     PIP/TAZO <=4 SENSITIVE Sensitive     * ESCHERICHIA COLI  Respiratory Panel by RT PCR (Flu A&B, Covid) - Nasopharyngeal Swab     Status: None   Collection Time: 06/27/20  7:51 PM   Specimen: Nasopharyngeal Swab  Result Value Ref Range Status   SARS Coronavirus 2 by RT PCR NEGATIVE NEGATIVE Final    Comment: (NOTE) SARS-CoV-2 target nucleic acids are NOT DETECTED.  The SARS-CoV-2 RNA is generally detectable in upper respiratoy specimens during the acute phase of infection. The lowest concentration of SARS-CoV-2 viral copies this assay can detect is 131 copies/mL. A negative result does not preclude SARS-Cov-2 infection and should not be used as the sole basis for treatment or other patient management decisions. A negative result may occur with  improper specimen collection/handling, submission of specimen other than nasopharyngeal swab, presence of viral mutation(s) within the areas targeted by this assay, and  inadequate number of viral copies (<131 copies/mL). A negative result must be combined with clinical observations, patient history, and epidemiological information. The expected result is Negative.  Fact Sheet for Patients:  PinkCheek.be  Fact Sheet for Healthcare Providers:  GravelBags.it  This test is no t yet approved or cleared by the [place] FDA and  has been authorized for detection and/or diagnosis of SARS-CoV-2 by FDA under an Emergency Use Authorization (EUA). This EUA will remain  in effect (meaning this test can be used) for the duration of the COVID-19 declaration under Section 564(b)(1) of the Act, 21 U.S.C. section 360bbb-3(b)(1), unless the authorization is terminated or revoked sooner.     Influenza  A by PCR NEGATIVE NEGATIVE Final   Influenza B by PCR NEGATIVE NEGATIVE Final    Comment: (NOTE) The Xpert Xpress SARS-CoV-2/FLU/RSV assay is intended as an aid in  the diagnosis of influenza from Nasopharyngeal swab specimens and  should not be used as a sole basis for treatment. Nasal washings and  aspirates are unacceptable for Xpert Xpress SARS-CoV-2/FLU/RSV  testing.  Fact Sheet for Patients: PinkCheek.be  Fact Sheet for Healthcare Providers: GravelBags.it  This test is not yet approved or cleared by the [place] FDA and  has been authorized for detection and/or diagnosis of SARS-CoV-2 by  FDA under an Emergency Use Authorization (EUA). This EUA will remain  in effect (meaning this test can be used) for the duration of the  Covid-19 declaration under Section 564(b)(1) of the Act, 21  U.S.C. section 360bbb-3(b)(1), unless the authorization is  terminated or revoked. Performed at [facility], [address] [place], [place] [address]   Blood Culture ID Panel (Reflexed)     Status: Abnormal   Collection Time: 06/27/20  7:51  PM  Result Value Ref Range Status   Enterococcus faecalis NOT DETECTED NOT DETECTED Final   Enterococcus Faecium NOT DETECTED NOT DETECTED Final   Listeria monocytogenes NOT DETECTED NOT DETECTED Final   Staphylococcus species NOT DETECTED NOT DETECTED Final   Staphylococcus aureus (BCID) NOT DETECTED NOT DETECTED Final   Staphylococcus epidermidis NOT DETECTED NOT DETECTED Final   Staphylococcus lugdunensis NOT DETECTED NOT DETECTED Final   Streptococcus species NOT DETECTED NOT DETECTED Final   Streptococcus agalactiae NOT DETECTED NOT DETECTED Final   Streptococcus pneumoniae NOT DETECTED NOT DETECTED Final   Streptococcus pyogenes NOT DETECTED NOT DETECTED Final   A.calcoaceticus-baumannii NOT DETECTED NOT DETECTED Final   Bacteroides fragilis NOT DETECTED NOT DETECTED Final   Enterobacterales DETECTED (A) NOT DETECTED Final    Comment: Enterobacterales represent a large order of gram negative bacteria, not a single organism. CRITICAL RESULT CALLED TO, READ BACK BY AND VERIFIED WITH:  NATHAN BELUE AT 1624 06/28/20 SDR    Enterobacter cloacae complex NOT DETECTED NOT DETECTED Final   Escherichia coli DETECTED (A) NOT DETECTED Final    Comment: CRITICAL RESULT CALLED TO, READ BACK BY AND VERIFIED WITH:  NATHAN BELUE AT 4695 06/28/20 SDR    Klebsiella aerogenes NOT DETECTED NOT DETECTED Final   Klebsiella oxytoca NOT DETECTED NOT DETECTED Final   Klebsiella pneumoniae NOT DETECTED NOT DETECTED Final   Proteus species NOT DETECTED NOT DETECTED Final   Salmonella species NOT DETECTED NOT DETECTED Final   Serratia marcescens NOT DETECTED NOT DETECTED Final   Haemophilus influenzae NOT DETECTED NOT DETECTED Final   Neisseria meningitidis NOT DETECTED NOT DETECTED Final   Pseudomonas aeruginosa NOT DETECTED NOT DETECTED Final   Stenotrophomonas maltophilia NOT DETECTED NOT DETECTED Final   Candida albicans NOT DETECTED NOT DETECTED Final   Candida auris NOT DETECTED NOT DETECTED Final    Candida glabrata NOT DETECTED NOT DETECTED Final   Candida krusei NOT DETECTED NOT DETECTED Final   Candida parapsilosis NOT DETECTED NOT DETECTED Final   Candida tropicalis NOT DETECTED NOT DETECTED Final   Cryptococcus neoformans/gattii NOT DETECTED NOT DETECTED Final   CTX-M ESBL NOT DETECTED NOT DETECTED Final   Carbapenem resistance IMP NOT DETECTED NOT DETECTED Final   Carbapenem resistance KPC NOT DETECTED NOT DETECTED Final   Carbapenem resistance NDM NOT DETECTED NOT DETECTED Final   Carbapenem resist OXA 48 LIKE NOT DETECTED NOT DETECTED Final  Carbapenem resistance VIM NOT DETECTED NOT DETECTED Final    Comment: Performed at [facility], [address] [place], [place] [address]  MRSA PCR Screening     Status: None   Collection Time: 06/28/20  5:21 AM   Specimen: Nasopharyngeal  Result Value Ref Range Status   MRSA by PCR NEGATIVE NEGATIVE Final    Comment:        The GeneXpert MRSA Assay (FDA approved for NASAL specimens only), is one component of a comprehensive MRSA colonization surveillance program. It is not intended to diagnose MRSA infection nor to guide or monitor treatment for MRSA infections. Performed at [facility], [address] [place], [place] [address]   Aerobic/Anaerobic Culture (surgical/deep wound)     Status: None   Collection Time: 06/28/20 11:02 AM   Specimen: Fluid; Abscess  Result Value Ref Range Status   Specimen Description   Final    FLUID Performed at [facility] [address], [place], [place] [address]    Special Requests   Final    gallbladder fluid Performed at [facility], [place], [place] [address]    Gram Stain   Final    FEW WBC PRESENT,BOTH PMN AND MONONUCLEAR MODERATE GRAM POSITIVE RODS    Culture   Final    FEW ESCHERICHIA COLI RARE KLEBSIELLA OXYTOCA NO ANAEROBES ISOLATED Performed at [facility], [place] [address] [place], [place] [address]      Report Status 07/03/2020 FINAL  Final   Organism ID, Bacteria ESCHERICHIA COLI  Final   Organism ID, Bacteria KLEBSIELLA OXYTOCA  Final      Susceptibility   Escherichia coli - MIC*    AMPICILLIN >=32 RESISTANT Resistant     CEFAZOLIN <=4 SENSITIVE Sensitive     CEFEPIME <=0.12 SENSITIVE Sensitive     CEFTAZIDIME <=1 SENSITIVE Sensitive     CEFTRIAXONE <=0.25 SENSITIVE Sensitive     CIPROFLOXACIN <=0.25 SENSITIVE Sensitive     GENTAMICIN <=1 SENSITIVE Sensitive     IMIPENEM <=0.25 SENSITIVE Sensitive     TRIMETH/SULFA <=20 SENSITIVE Sensitive     AMPICILLIN/SULBACTAM 16 INTERMEDIATE Intermediate     PIP/TAZO <=4 SENSITIVE Sensitive     * FEW ESCHERICHIA COLI   Klebsiella oxytoca - MIC*    AMPICILLIN >=32 RESISTANT Resistant     CEFAZOLIN <=4 SENSITIVE Sensitive     CEFEPIME <=0.12 SENSITIVE Sensitive     CEFTAZIDIME <=1 SENSITIVE Sensitive     CEFTRIAXONE <=0.25 SENSITIVE Sensitive     CIPROFLOXACIN <=0.25 SENSITIVE Sensitive     GENTAMICIN <=1 SENSITIVE Sensitive     IMIPENEM <=0.25 SENSITIVE Sensitive     TRIMETH/SULFA <=20 SENSITIVE Sensitive     AMPICILLIN/SULBACTAM 8 SENSITIVE Sensitive     PIP/TAZO <=4 SENSITIVE Sensitive     * RARE KLEBSIELLA OXYTOCA     Labs: Basic Metabolic Panel: Recent Labs  Lab 07/02/20 0616 07/02/20 0616 07/03/20 0500 07/04/20 0609 07/05/20 1004 07/06/20 0606 07/07/20 0545  NA 135   < > 136 134* 136 137 142  K 3.6   < > 3.3* 3.6 2.8* 3.3* 3.6  CL 106   < > 107 105 104 107 110  CO2 17*   < > 17* 17* 20* 21* 21*  GLUCOSE 130*   < > 121* 122* 125* 78 83  BUN 47*   < > 49* 52* 56* 53* 50*  CREATININE 3.22*   < > 3.27* 3.30* 3.03* 2.79* 2.55*  CALCIUM  7.4*   < > 7.4* 8.0* 8.0* 7.9* 8.4*  MG 2.0  --   --  1.9  --   --   --   PHOS  --   --   --  5.9* 5.4*  --   --    < > = values in this interval not displayed.   Liver Function Tests: Recent Labs  Lab 07/01/20 0625 07/03/20 0500 07/04/20 0609 07/05/20 1004  AST 201* 59*  --   --    ALT 172* 95*  --   --   ALKPHOS 198* 183*  --   --   BILITOT 2.3* 1.2  --   --   PROT 6.2* 5.7*  --   --   ALBUMIN 2.8* 2.5* 2.4* 2.5*   No results for input(s): LIPASE, AMYLASE in the last 168 hours. No results for input(s): AMMONIA in the last 168 hours. CBC: Recent Labs  Lab 07/01/20 0625 07/02/20 0616 07/03/20 0501 07/04/20 0609 07/06/20 0606  WBC 9.8 8.9 10.3 12.8* 12.3*  NEUTROABS 8.5* 7.2 8.3* 10.6* 10.0*  HGB 9.9* 9.6* 10.5* 11.2* 10.9*  HCT 27.3* 27.3* 29.5* 31.0* 30.2*  MCV 102.6* 102.2* 101.0* 99.7 101.0*  PLT 83* 86* 144* 214 276   Cardiac Enzymes: No results for input(s): CKTOTAL, CKMB, CKMBINDEX, TROPONINI in the last 168 hours. BNP: BNP (last 3 results) No results for input(s): BNP in the last 8760 hours.  ProBNP (last 3 results) No results for input(s): PROBNP in the last 8760 hours.  CBG: Recent Labs  Lab 07/06/20 0758 07/06/20 1545 07/07/20 0010 07/07/20 0805 07/07/20 1206  GLUCAP 77 80 84 76 90    Active Problems:   Acute cholecystitis   Hyperlipidemia   Acute renal failure with tubular necrosis (HCC)   Time coordinating discharge: 38 minutes.  Signed:        Makalya Nave, DO [facility]  07/07/2020, 1:19 PM

## 2020-07-10 ENCOUNTER — Telehealth: Payer: Self-pay | Admitting: Surgery

## 2020-07-10 ENCOUNTER — Other Ambulatory Visit (HOSPITAL_COMMUNITY): Payer: Self-pay | Admitting: Interventional Radiology

## 2020-07-10 DIAGNOSIS — K819 Cholecystitis, unspecified: Secondary | ICD-10-CM

## 2020-07-10 NOTE — Telephone Encounter (Signed)
Patient is calling about her wound said she hasn't had a nurse come out and look at her wound and is asking if we would be able to take a look at it for her. Please call patient and advise.

## 2020-07-10 NOTE — Telephone Encounter (Signed)
Spoke with patient and got her scheduled for a follow up appointment with Dr.Pabon on November 3rd, 2021. Patient states she was hospitalized for acute cholecystitis and septic shock on 06/27/20 and was discharged on 07/07/20. Patient states she was told that she would have a home health nurse come out to her home after being discharged. Patient states she was then informed that due to shortage of staff they may not be able to get to her. Patient asked if she could get an appointment with Dr.Pabon just to have a doctor look at the area and also to receive instructions on how to take care of the wound as far cleaning instructions. Patient states she didn't received those instructions while being hospitalized

## 2020-07-12 ENCOUNTER — Ambulatory Visit (INDEPENDENT_AMBULATORY_CARE_PROVIDER_SITE_OTHER): Payer: Federal, State, Local not specified - PPO | Admitting: Surgery

## 2020-07-12 ENCOUNTER — Encounter: Payer: Self-pay | Admitting: Surgery

## 2020-07-12 ENCOUNTER — Other Ambulatory Visit: Payer: Self-pay

## 2020-07-12 VITALS — BP 152/95 | HR 96 | Temp 98.3°F | Ht 64.0 in | Wt 202.0 lb

## 2020-07-12 DIAGNOSIS — K810 Acute cholecystitis: Secondary | ICD-10-CM

## 2020-07-12 NOTE — Progress Notes (Signed)
Outpatient Surgical Follow Up  07/12/2020  Carrie Hardy is an 59 y.o. female.   Chief Complaint  Patient presents with  . Routine Post Op    Post op - ED 06/27/20 cholecystitis , cholecystectomy tube - Dr Dahlia Byes    HPI: Carrie Hardy is a 59 year old female well-known to me with a history of cholecystitis requiring cholecystostomy tube.  Patient was septic requiring ICU care and vasopressors as well as steroids dose for shock.  She has recovered but has persistent renal insufficiency.  She is going to see neurology in the upcoming weeks.  She continues to have a cholecystostomy tube and is draining golden bile.  She denies any fevers and chills she denies any abdominal pain.  He did have an MRCP that I have personally reviewed showing evidence of cholecystitis without choledocholithiasis and without any other lesions.  She now comes accompanied by her husband  Past Medical History:  Diagnosis Date  . Hypertension     History reviewed. No pertinent surgical history.  History reviewed. No pertinent family history.  Social History:  reports that she has never smoked. She has never used smokeless tobacco. She reports previous alcohol use. She reports previous drug use.  Allergies:  Allergies  Allergen Reactions  . Other Other (See Comments)    Anesthesia medications in 1980 caused respiratory arrest  . Tramadol Itching    Medications reviewed.    ROS Full ROS performed and is otherwise negative other than what is stated in HPI   BP (!) 152/95   Pulse 96   Temp 98.3 F (36.8 C) (Oral)   Ht 5\' 4"  (1.626 m)   Wt 202 lb (91.6 kg)   SpO2 98%   BMI 34.67 kg/m   Physical Exam Vitals and nursing note reviewed. Exam conducted with a chaperone present.  Constitutional:      General: She is not in acute distress.    Appearance: Normal appearance. She is normal weight.  Pulmonary:     Effort: Pulmonary effort is normal. No respiratory distress.     Breath sounds: No stridor.   Abdominal:     General: Abdomen is flat. There is no distension.     Palpations: Abdomen is soft. There is no mass.     Tenderness: There is no abdominal tenderness. There is no guarding.     Hernia: No hernia is present.     Comments: choleCystostomy drain in place with golden bile  Musculoskeletal:        General: Normal range of motion.     Cervical back: Normal range of motion and neck supple. No rigidity or tenderness.  Skin:    General: Skin is warm and dry.     Capillary Refill: Capillary refill takes less than 2 seconds.  Neurological:     General: No focal deficit present.     Mental Status: She is alert and oriented to person, place, and time.  Psychiatric:        Mood and Affect: Mood normal.        Behavior: Behavior normal.        Thought Content: Thought content normal.        Judgment: Judgment normal.     Assessment/Plan:  HX of cholecystitis Patient with multiple medical issues including difficult fertility, acute kidney injury with persistent elevation of the creatinine. We have to change the gravity back for a JP pulled to make the cholecystostomy tube more friendly. Before surgical intervention is attempted she needs to  be in an opt to no medical condition.  There is still significant room for improvement from her renal perspective as well as her hyperglycemia and overall deconditioning. We will see her back in 1 month with recent labs and in the interim we will keep cholecystostomy tube.  There is no need for emergent surgical intervention.  Greater than 50% of the 40 minutes  visit was spent in counseling/coordination of care   Carrie Hamman, MD [facility]

## 2020-07-12 NOTE — Patient Instructions (Addendum)
Dr.Pabon applied a JP Drain at today's visit for patient.   Patient instructed to empty drain twice a day, patient advised to document the amount emptied from drain.  Patient will have lab work done in two weeks. Patient will follow up with Dr.Pabon in a month.

## 2020-07-14 ENCOUNTER — Other Ambulatory Visit: Payer: Self-pay | Admitting: Interventional Radiology

## 2020-07-14 DIAGNOSIS — K819 Cholecystitis, unspecified: Secondary | ICD-10-CM

## 2020-07-27 ENCOUNTER — Other Ambulatory Visit
Admission: RE | Admit: 2020-07-27 | Discharge: 2020-07-27 | Disposition: A | Discharge status: Home / Self Care | Payer: Federal, State, Local not specified - PPO | Source: Ambulatory Visit | Attending: Surgery | Admitting: Surgery

## 2020-07-27 DIAGNOSIS — K810 Acute cholecystitis: Secondary | ICD-10-CM

## 2020-07-27 LAB — COMPREHENSIVE METABOLIC PANEL
ALT: 13 U/L (ref 0–44)
AST: 22 U/L (ref 15–41)
Albumin: 3.5 g/dL (ref 3.5–5.0)
Alkaline Phosphatase: 176 U/L — ABNORMAL HIGH (ref 38–126)
Anion gap: 10 (ref 5–15)
BUN: <5 mg/dL — ABNORMAL LOW (ref 8–23)
CO2: 24 mmol/L (ref 22–32)
Calcium: 8.8 mg/dL — ABNORMAL LOW (ref 8.9–10.3)
Chloride: 105 mmol/L (ref 98–111)
Creatinine, Ser: 0.5 mg/dL (ref 0.44–1.00)
GFR, Estimated: >60 mL/min (ref >60)
Glucose, Bld: 90 mg/dL (ref 70–99)
Potassium: 3.4 mmol/L — ABNORMAL LOW (ref 3.5–5.1)
Sodium: 139 mmol/L (ref 135–145)
Total Bilirubin: 0.8 mg/dL (ref 0.3–1.2)
Total Protein: 7.9 g/dL (ref 6.5–8.1)

## 2020-07-27 LAB — CBC WITH DIFFERENTIAL/PLATELET
Abs Immature Granulocytes: 0.01 10*3/uL (ref 0.00–0.07)
Basophils Absolute: 0 10*3/uL (ref 0.0–0.1)
Basophils Relative: 1 %
Eosinophils Absolute: 0.1 10*3/uL (ref 0.0–0.5)
Eosinophils Relative: 3 %
HCT: 28.4 % — ABNORMAL LOW (ref 36.0–46.0)
Hemoglobin: 9.5 g/dL — ABNORMAL LOW (ref 12.0–15.0)
Immature Granulocytes: 0 %
Lymphocytes Relative: 30 %
Lymphs Abs: 1.4 10*3/uL (ref 0.7–4.0)
MCH: 34.7 pg — ABNORMAL HIGH (ref 26.0–34.0)
MCHC: 33.5 g/dL (ref 30.0–36.0)
MCV: 103.6 fL — ABNORMAL HIGH (ref 80.0–100.0)
Monocytes Absolute: 0.5 10*3/uL (ref 0.1–1.0)
Monocytes Relative: 11 %
Neutro Abs: 2.5 10*3/uL (ref 1.7–7.7)
Neutrophils Relative %: 55 %
Platelets: 419 10*3/uL — ABNORMAL HIGH (ref 150–400)
RBC: 2.74 MIL/uL — ABNORMAL LOW (ref 3.87–5.11)
RDW: 14.7 % (ref 11.5–15.5)
WBC: 4.6 10*3/uL (ref 4.0–10.5)
nRBC: 0 % (ref 0.0–0.2)

## 2020-07-28 ENCOUNTER — Telehealth: Payer: Self-pay

## 2020-07-28 NOTE — Telephone Encounter (Signed)
LVM for pt to call our office for lab results.

## 2020-08-11 ENCOUNTER — Other Ambulatory Visit: Payer: Self-pay

## 2020-08-11 ENCOUNTER — Ambulatory Visit
Admission: RE | Admit: 2020-08-11 | Discharge: 2020-08-11 | Disposition: A | Discharge status: Home / Self Care | Payer: Federal, State, Local not specified - PPO | Source: Ambulatory Visit | Attending: Interventional Radiology | Admitting: Interventional Radiology

## 2020-08-11 ENCOUNTER — Ambulatory Visit: Payer: Federal, State, Local not specified - PPO

## 2020-08-11 DIAGNOSIS — Z885 Allergy status to narcotic agent status: Secondary | ICD-10-CM | POA: Diagnosis not present

## 2020-08-11 DIAGNOSIS — K8010 Calculus of gallbladder with chronic cholecystitis without obstruction: Secondary | ICD-10-CM | POA: Insufficient documentation

## 2020-08-11 DIAGNOSIS — Z888 Allergy status to other drugs, medicaments and biological substances status: Secondary | ICD-10-CM | POA: Diagnosis not present

## 2020-08-11 DIAGNOSIS — Z434 Encounter for attention to other artificial openings of digestive tract: Secondary | ICD-10-CM | POA: Insufficient documentation

## 2020-08-11 DIAGNOSIS — Z79899 Other long term (current) drug therapy: Secondary | ICD-10-CM | POA: Diagnosis not present

## 2020-08-11 DIAGNOSIS — K819 Cholecystitis, unspecified: Secondary | ICD-10-CM

## 2020-08-11 HISTORY — PX: IR CATHETER TUBE CHANGE: IMG717

## 2020-08-11 MED ORDER — IODIXANOL 320 MG/ML IV SOLN
10.0000 mL | Freq: Once | INTRAVENOUS | Status: AC | PRN
  Administered 2020-08-11: 10:00:00 10 mL

## 2020-08-11 NOTE — Procedures (Signed)
Interventional Radiology Procedure:   Indications: Cholecystostomy tube care  Procedure: Cholecystostomy tube exchange and injection  Findings: New 10  Fr drain in gallbladder.  Cystic duct is obstructed.  Complications: None     EBL: None  Plan: Follow up with general surgery   Carrie Hardy. Anselm Pancoast, MD  Pager: [phone]

## 2020-08-11 NOTE — H&P (Signed)
Chief Complaint: Patient was seen in consultation today for perc chole drain injection/exchange at the request of Watts,John  Referring Physician(s): Dr. Dahlia Byes  Supervising Physician: Markus Daft  Patient Status: [facility] - Out-pt  History of Present Illness: Carrie Hardy is a 59 y.o. female with recent admission for cholecystitis. Had to have perc chole drain placed 10/20. Doing well since discharge. Now scheduled for cholangiogram and possible exchange. States she is to see Dr. Dahlia Byes next week to discuss surgery. PMHx, meds, labs, imaging, allergies reviewed. Feels well, no recent fevers, chills, illness. Has been NPO today as directed.   Past Medical History:  Diagnosis Date  . Hypertension     No past surgical history on file.  Allergies: Other and Tramadol  Medications: Prior to Admission medications   Medication Sig Start Date End Date Taking? Authorizing Provider  Ensure Max Protein (ENSURE MAX PROTEIN) LIQD Take 330 mLs (11 oz total) by mouth 2 (two) times daily. 07/07/20  Yes Swayze, Ava, DO  HYDROcodone-acetaminophen (NORCO/VICODIN) 5-325 MG tablet Take 1 tablet by mouth every 6 (six) hours as needed for moderate pain or severe pain. 07/07/20  Yes Swayze, Ava, DO  Multiple Vitamin (MULTIVITAMIN WITH MINERALS) TABS tablet Take 1 tablet by mouth daily. 07/08/20  Yes Swayze, Ava, DO  pantoprazole (PROTONIX) 40 MG tablet Take 1 tablet (40 mg total) by mouth daily. 07/08/20  Yes Swayze, Ava, DO  potassium chloride SA (KLOR-CON) 20 MEQ tablet Take 1 tablet (20 mEq total) by mouth daily. 07/07/20  Yes Swayze, Ava, DO  sodium chloride flush (NS) 0.9 % SOLN Inject 10 mLs into the vein daily. 07/07/20 09/07/20  Swayze, Ava, DO     No family history on file.  Social History   Socioeconomic History  . Marital status: Married    Spouse name: Not on file  . Number of children: Not on file  . Years of education: Not on file  . Highest education level: Not on file    Occupational History  . Not on file  Tobacco Use  . Smoking status: Never Smoker  . Smokeless tobacco: Never Used  Vaping Use  . Vaping Use: Never assessed  Substance and Sexual Activity  . Alcohol use: Not Currently  . Drug use: Not Currently  . Sexual activity: Not on file  Other Topics Concern  . Not on file  Social History Narrative  . Not on file   Social Determinants of Health   Financial Resource Strain:   . Difficulty of Paying Living Expenses: Not on file  Food Insecurity:   . Worried About Charity fundraiser in the Last Year: Not on file  . Ran Out of Food in the Last Year: Not on file  Transportation Needs:   . Lack of Transportation (Medical): Not on file  . Lack of Transportation (Non-Medical): Not on file  Physical Activity:   . Days of Exercise per Week: Not on file  . Minutes of Exercise per Session: Not on file  Stress:   . Feeling of Stress : Not on file  Social Connections:   . Frequency of Communication with Friends and Family: Not on file  . Frequency of Social Gatherings with Friends and Family: Not on file  . Attends Religious Services: Not on file  . Active Member of Clubs or Organizations: Not on file  . Attends Archivist Meetings: Not on file  . Marital Status: Not on file     Review of Systems: A  12 point ROS discussed and pertinent positives are indicated in the HPI above.  All other systems are negative.  Review of Systems  Vital Signs: BP 116/73   Pulse (!) 103   Temp 98.2 F (36.8 C) (Oral)   Resp 20   Ht 5\' 5"  (1.651 m)   Wt 91.6 kg   SpO2 99%   BMI 33.60 kg/m   Physical Exam Constitutional:      Appearance: Normal appearance. She is not ill-appearing.  HENT:     Mouth/Throat:     Mouth: Mucous membranes are moist.     Pharynx: Oropharynx is clear.  Cardiovascular:     Rate and Rhythm: Normal rate and regular rhythm.     Pulses: Normal pulses.     Heart sounds: Normal heart sounds.  Pulmonary:      Breath sounds: Normal breath sounds.  Abdominal:     General: Abdomen is flat.     Palpations: Abdomen is soft.     Comments: RUQ drain intact, bilious output  Skin:    General: Skin is warm and dry.  Neurological:     General: No focal deficit present.     Mental Status: She is alert and oriented to person, place, and time.  Psychiatric:        Mood and Affect: Mood normal.        Thought Content: Thought content normal.        Judgment: Judgment normal.     Labs:  CBC: Recent Labs    07/03/20 0501 07/04/20 0609 07/06/20 0606 07/27/20 1614  WBC 10.3 12.8* 12.3* 4.6  HGB 10.5* 11.2* 10.9* 9.5*  HCT 29.5* 31.0* 30.2* 28.4*  PLT 144* 214 276 419*    COAGS: Recent Labs    06/27/20 1811 07/01/20 0625  INR 1.1 1.0  APTT 23*  --     BMP: Recent Labs    07/05/20 1004 07/06/20 0606 07/07/20 0545 07/27/20 1614  NA 136 137 142 139  K 2.8* 3.3* 3.6 3.4*  CL 104 107 110 105  CO2 20* 21* 21* 24  GLUCOSE 125* 78 83 90  BUN 56* 53* 50* <5*  CALCIUM 8.0* 7.9* 8.4* 8.8*  CREATININE 3.03* 2.79* 2.55* 0.50  GFRNONAA 17* 19* 21* >60    LIVER FUNCTION TESTS: Recent Labs    06/30/20 0415 06/30/20 0415 07/01/20 0625 07/01/20 0625 07/03/20 0500 07/04/20 0609 07/05/20 1004 07/27/20 1614  BILITOT 2.8*  --  2.3*  --  1.2  --   --  0.8  AST 394*  --  201*  --  59*  --   --  22  ALT 215*  --  172*  --  95*  --   --  13  ALKPHOS 119  --  198*  --  183*  --   --  176*  PROT 5.9*  --  6.2*  --  5.7*  --   --  7.9  ALBUMIN 2.8*   < > 2.8*   < > 2.5* 2.4* 2.5* 3.5   < > = values in this interval not displayed.    TUMOR MARKERS: No results for input(s): AFPTM, CEA, CA199, CHROMGRNA in the last 8760 hours.  Assessment and Plan: Cholecystitis and cholelithiasis S/p perc chole 10/20 For cholangiogram and drain exchange Risks and benefits discussed with the patient including bleeding, infection, damage to adjacent structures, and sepsis.  All of the patient's  questions were answered, patient is agreeable to proceed. Consent signed and  in chart.    Thank you for this interesting consult.  I greatly enjoyed meeting Carrie Hardy and look forward to participating in their care.  A copy of this report was sent to the requesting provider on this date.  Electronically Signed: Ascencion Dike, PA-C 08/11/2020, 9:45 AM   I spent a total of 20 minutes in face to face in clinical consultation, greater than 50% of which was counseling/coordinating care for cholangiogram/drain exchange

## 2020-08-14 ENCOUNTER — Encounter: Payer: Self-pay | Admitting: Surgery

## 2020-08-14 ENCOUNTER — Ambulatory Visit (INDEPENDENT_AMBULATORY_CARE_PROVIDER_SITE_OTHER): Payer: Federal, State, Local not specified - PPO | Admitting: Surgery

## 2020-08-14 ENCOUNTER — Telehealth: Payer: Self-pay | Admitting: Surgery

## 2020-08-14 ENCOUNTER — Other Ambulatory Visit: Payer: Self-pay

## 2020-08-14 VITALS — BP 137/78 | HR 118 | Temp 98.7°F | Ht 65.0 in | Wt 169.0 lb

## 2020-08-14 DIAGNOSIS — K810 Acute cholecystitis: Secondary | ICD-10-CM

## 2020-08-14 NOTE — Patient Instructions (Signed)
Our surgery scheduler Pamala Hurry will contact you within the next 24-48 hours to discuss the preparation prior to surgery and also discuss the times to align with your schedule. Please have the BLUE sheet when she contacts you. If you have any questions or concerns, please do not hesitate to give our office a call.  Laparoscopic Cholecystectomy Laparoscopic cholecystectomy is surgery to remove the gallbladder. The gallbladder is a pear-shaped organ that lies beneath the liver on the right side of the body. The gallbladder stores bile, which is a fluid that helps the body to digest fats. Cholecystectomy is often done for inflammation of the gallbladder (cholecystitis). This condition is usually caused by a buildup of gallstones (cholelithiasis) in the gallbladder. Gallstones can block the flow of bile, which can result in inflammation and pain. In severe cases, emergency surgery may be required. This procedure is done though small incisions in your abdomen (laparoscopic surgery). A thin scope with a camera (laparoscope) is inserted through one incision. Thin surgical instruments are inserted through the other incisions. In some cases, a laparoscopic procedure may be turned into a type of surgery that is done through a larger incision (open surgery). Tell a health care provider about:  Any allergies you have.  All medicines you are taking, including vitamins, herbs, eye drops, creams, and over-the-counter medicines.  Any problems you or family members have had with anesthetic medicines.  Any blood disorders you have.  Any surgeries you have had.  Any medical conditions you have.  Whether you are pregnant or may be pregnant. What are the risks? Generally, this is a safe procedure. However, problems may occur, including:  Infection.  Bleeding.  Allergic reactions to medicines.  Damage to other structures or organs.  A stone remaining in the common bile duct. The common bile duct carries bile  from the gallbladder into the small intestine.  A bile leak from the cyst duct that is clipped when your gallbladder is removed. What happens before the procedure? Staying hydrated Follow instructions from your health care provider about hydration, which may include:  Up to 2 hours before the procedure - you may continue to drink clear liquids, such as water, clear fruit juice, black coffee, and plain tea. Eating and drinking restrictions Follow instructions from your health care provider about eating and drinking, which may include:  8 hours before the procedure - stop eating heavy meals or foods such as meat, fried foods, or fatty foods.  6 hours before the procedure - stop eating light meals or foods, such as toast or cereal.  6 hours before the procedure - stop drinking milk or drinks that contain milk.  2 hours before the procedure - stop drinking clear liquids. Medicines  Ask your health care provider about: ? Changing or stopping your regular medicines. This is especially important if you are taking diabetes medicines or blood thinners. ? Taking medicines such as aspirin and ibuprofen. These medicines can thin your blood. Do not take these medicines before your procedure if your health care provider instructs you not to.  You may be given antibiotic medicine to help prevent infection. General instructions  Let your health care provider know if you develop a cold or an infection before surgery.  Plan to have someone take you home from the hospital or clinic.  Ask your health care provider how your surgical site will be marked or identified. What happens during the procedure?   To reduce your risk of infection: ? Your health care team  will wash or sanitize their hands. ? Your skin will be washed with soap. ? Hair may be removed from the surgical area.  An IV tube may be inserted into one of your veins.  You will be given one or more of the following: ? A medicine to  help you relax (sedative). ? A medicine to make you fall asleep (general anesthetic).  A breathing tube will be placed in your mouth.  Your surgeon will make several small cuts (incisions) in your abdomen.  The laparoscope will be inserted through one of the small incisions. The camera on the laparoscope will send images to a TV screen (monitor) in the operating room. This lets your surgeon see inside your abdomen.  Air-like gas will be pumped into your abdomen. This will expand your abdomen to give the surgeon more room to perform the surgery.  Other tools that are needed for the procedure will be inserted through the other incisions. The gallbladder will be removed through one of the incisions.  Your common bile duct may be examined. If stones are found in the common bile duct, they may be removed.  After your gallbladder has been removed, the incisions will be closed with stitches (sutures), staples, or skin glue.  Your incisions may be covered with a bandage (dressing). The procedure may vary among health care providers and hospitals. What happens after the procedure?  Your blood pressure, heart rate, breathing rate, and blood oxygen level will be monitored until the medicines you were given have worn off.  You will be given medicines as needed to control your pain.  Do not drive for 24 hours if you were given a sedative. This information is not intended to replace advice given to you by your health care provider. Make sure you discuss any questions you have with your health care provider. Document Revised: 08/08/2017 Document Reviewed: 02/12/2016 Elsevier Patient Education  [place].

## 2020-08-14 NOTE — Telephone Encounter (Signed)
Patient has been advised of Pre-Admission date/time, COVID Testing date and Surgery date.  Surgery Date: 08/25/20 Preadmission Testing Date: 08/18/20 (phone 8a-1p) Covid Testing Date: 08/23/20 - patient advised to go to the [facility] ([place]) between 8a-1p   Patient has been made aware to call [phone], between 1-3:00pm the day before surgery, to find out what time to arrive for surgery.

## 2020-08-14 NOTE — Progress Notes (Signed)
Outpatient Surgical Follow Up  08/14/2020  KHRISTINE VERNO is an 59 y.o. female.   Chief Complaint  Patient presents with  . Follow-up    06/27/20 cholecystitis , cholecystectomy tube - Dr Dahlia Byes    HPI: [name] is a 59 year old female with a history of acute cholecystitis sepsis hypotension that was treated with a cholecystostomy tube antibiotics.  She has recovered well.  Her kidney function now has normalized and she has been optimized from medical perspective.  She did have an exchange of the catheter last Friday.  Continues to put out about 200 to 300 cc of golden bile intake from cholecystostomy tube.  He is able to walk and is back to baseline.  She denies any chest pains or shortness of breath.  She is ready to move forward for cholecystectomy  Past Medical History:  Diagnosis Date  . Hypertension     Past Surgical History:  Procedure Laterality Date  . IR CATHETER TUBE CHANGE  08/11/2020    No family history on file.  Social History:  reports that she has never smoked. She has never used smokeless tobacco. She reports previous alcohol use. She reports previous drug use.  Allergies:  Allergies  Allergen Reactions  . Other Other (See Comments)    Anesthesia medications in 1980 caused respiratory arrest  . Tramadol Itching    Medications reviewed.    ROS Full ROS performed and is otherwise negative other than what is stated in HPI   BP 137/78   Pulse (!) 118   Temp 98.7 F (37.1 C) (Oral)   Ht 5\' 5"  (1.651 m)   Wt 169 lb (76.7 kg)   SpO2 99%   BMI 28.12 kg/m   Physical Exam Vitals and nursing note reviewed. Exam conducted with a chaperone present.  Constitutional:      General: She is not in acute distress.    Appearance: Normal appearance. She is normal weight.  Cardiovascular:     Rate and Rhythm: Normal rate and regular rhythm.     Heart sounds: No murmur heard.   Pulmonary:     Effort: Pulmonary effort is normal. No respiratory distress.      Breath sounds: Normal breath sounds. No stridor.  Abdominal:     General: Abdomen is flat. There is no distension.     Palpations: Abdomen is soft. There is no mass.     Tenderness: There is no abdominal tenderness. There is no guarding or rebound.     Hernia: No hernia is present.     Comments: cholecystostomy tube in place w golden bile  Musculoskeletal:        General: No swelling or tenderness. Normal range of motion.     Cervical back: Normal range of motion and neck supple. No rigidity or tenderness.  Skin:    General: Skin is warm and dry.     Capillary Refill: Capillary refill takes less than 2 seconds.  Neurological:     General: No focal deficit present.     Mental Status: She is alert.  Psychiatric:        Mood and Affect: Mood normal.        Behavior: Behavior normal.        Thought Content: Thought content normal.        Judgment: Judgment normal.      Assessment/Plan: 59 year old female with history of cholecystitis requiring cholecystostomy tube.  She is now ready for cholecystectomy.  I do think that now is a  reasonable time to proceed with robotic cholecystectomy.  The risks, benefits, complications, treatment options, and expected outcomes were discussed with the patient. The possibilities of bleeding, recurrent infection, finding a normal gallbladder, perforation of viscus organs, damage to surrounding structures, bile leak, abscess formation, needing a drain placed, the need for additional procedures, reaction to medication, pulmonary aspiration,  failure to diagnose a condition, the possible need to convert to an open procedure, and creating a complication requiring transfusion or operation were discussed with the patient. The patient and/or family concurred with the proposed plan, giving informed consent.  Greater than 50% of the 45 minutes  visit was spent in counseling/coordination of care   Caroleen Hamman, MD [facility]

## 2020-08-14 NOTE — H&P (View-Only) (Signed)
Outpatient Surgical Follow Up  08/14/2020  Carrie Hardy is an 59 y.o. female.   Chief Complaint  Patient presents with  . Follow-up    06/27/20 cholecystitis , cholecystectomy tube - Dr Dahlia Byes    HPI: Carrie Hardy is a 59 year old female with a history of acute cholecystitis sepsis hypotension that was treated with a cholecystostomy tube antibiotics.  She has recovered well.  Her kidney function now has normalized and she has been optimized from medical perspective.  She did have an exchange of the catheter last Friday.  Continues to put out about 200 to 300 cc of golden bile intake from cholecystostomy tube.  He is able to walk and is back to baseline.  She denies any chest pains or shortness of breath.  She is ready to move forward for cholecystectomy  Past Medical History:  Diagnosis Date  . Hypertension     Past Surgical History:  Procedure Laterality Date  . IR CATHETER TUBE CHANGE  08/11/2020    No family history on file.  Social History:  reports that she has never smoked. She has never used smokeless tobacco. She reports previous alcohol use. She reports previous drug use.  Allergies:  Allergies  Allergen Reactions  . Other Other (See Comments)    Anesthesia medications in 1980 caused respiratory arrest  . Tramadol Itching    Medications reviewed.    ROS Full ROS performed and is otherwise negative other than what is stated in HPI   BP 137/78   Pulse (!) 118   Temp 98.7 F (37.1 C) (Oral)   Ht 5\' 5"  (1.651 m)   Wt 169 lb (76.7 kg)   SpO2 99%   BMI 28.12 kg/m   Physical Exam Vitals and nursing note reviewed. Exam conducted with a chaperone present.  Constitutional:      General: She is not in acute distress.    Appearance: Normal appearance. She is normal weight.  Cardiovascular:     Rate and Rhythm: Normal rate and regular rhythm.     Heart sounds: No murmur heard.   Pulmonary:     Effort: Pulmonary effort is normal. No respiratory distress.      Breath sounds: Normal breath sounds. No stridor.  Abdominal:     General: Abdomen is flat. There is no distension.     Palpations: Abdomen is soft. There is no mass.     Tenderness: There is no abdominal tenderness. There is no guarding or rebound.     Hernia: No hernia is present.     Comments: cholecystostomy tube in place w golden bile  Musculoskeletal:        General: No swelling or tenderness. Normal range of motion.     Cervical back: Normal range of motion and neck supple. No rigidity or tenderness.  Skin:    General: Skin is warm and dry.     Capillary Refill: Capillary refill takes less than 2 seconds.  Neurological:     General: No focal deficit present.     Mental Status: She is alert.  Psychiatric:        Mood and Affect: Mood normal.        Behavior: Behavior normal.        Thought Content: Thought content normal.        Judgment: Judgment normal.      Assessment/Plan: 59 year old female with history of cholecystitis requiring cholecystostomy tube.  She is now ready for cholecystectomy.  I do think that now is a  reasonable time to proceed with robotic cholecystectomy.  The risks, benefits, complications, treatment options, and expected outcomes were discussed with the patient. The possibilities of bleeding, recurrent infection, finding a normal gallbladder, perforation of viscus organs, damage to surrounding structures, bile leak, abscess formation, needing a drain placed, the need for additional procedures, reaction to medication, pulmonary aspiration,  failure to diagnose a condition, the possible need to convert to an open procedure, and creating a complication requiring transfusion or operation were discussed with the patient. The patient and/or family concurred with the proposed plan, giving informed consent.  Greater than 50% of the 45 minutes  visit was spent in counseling/coordination of care   Caroleen Hamman, MD [facility]

## 2020-08-16 ENCOUNTER — Other Ambulatory Visit
Admission: RE | Admit: 2020-08-16 | Discharge: 2020-08-16 | Disposition: A | Discharge status: Home / Self Care | Payer: Federal, State, Local not specified - PPO | Source: Ambulatory Visit | Attending: Surgery | Admitting: Surgery

## 2020-08-16 ENCOUNTER — Other Ambulatory Visit: Payer: Self-pay

## 2020-08-16 ENCOUNTER — Telehealth: Payer: Self-pay | Admitting: Surgery

## 2020-08-16 DIAGNOSIS — K8060 Calculus of gallbladder and bile duct with cholecystitis, unspecified, without obstruction: Secondary | ICD-10-CM | POA: Diagnosis not present

## 2020-08-16 DIAGNOSIS — K811 Chronic cholecystitis: Secondary | ICD-10-CM | POA: Diagnosis present

## 2020-08-16 DIAGNOSIS — Z20822 Contact with and (suspected) exposure to covid-19: Secondary | ICD-10-CM | POA: Diagnosis not present

## 2020-08-16 DIAGNOSIS — Z01812 Encounter for preprocedural laboratory examination: Secondary | ICD-10-CM | POA: Insufficient documentation

## 2020-08-16 MED ORDER — INDOCYANINE GREEN 25 MG IV SOLR
7.5000 mg | Freq: Once | INTRAVENOUS | Status: AC
  Administered 2020-08-17: 7.5 mg via INTRAVENOUS
  Filled 2020-08-16: qty 3

## 2020-08-16 NOTE — Telephone Encounter (Signed)
Updated information regarding rescheduled surgery per Dr. Dahlia Byes.  Patient has been advised of Pre-Admission date/time, COVID Testing date and Surgery date.  Surgery Date: 08/17/20 Preadmission Testing Date: 08/17/20 (patient informed to arrive 2 hours early ) Covid Testing Date: 08/16/20 - patient advised to go to the [facility] ([place]) between 8a-1p  Patient has been made aware to call [phone], between 1-3:00pm the day before surgery, to find out what time to arrive for surgery.

## 2020-08-17 ENCOUNTER — Ambulatory Visit: Payer: Federal, State, Local not specified - PPO

## 2020-08-17 ENCOUNTER — Ambulatory Visit
Admission: RE | Admit: 2020-08-17 | Discharge: 2020-08-17 | Disposition: A | Discharge status: Home / Self Care | Payer: Federal, State, Local not specified - PPO | Attending: Surgery | Admitting: Surgery

## 2020-08-17 ENCOUNTER — Ambulatory Visit: Payer: Federal, State, Local not specified - PPO | Admitting: Anesthesiology

## 2020-08-17 ENCOUNTER — Encounter: Payer: Self-pay | Admitting: Surgery

## 2020-08-17 ENCOUNTER — Encounter
Admission: RE | Disposition: A | Discharge status: Home / Self Care | Payer: Self-pay | Source: Home / Self Care | Attending: Surgery

## 2020-08-17 ENCOUNTER — Other Ambulatory Visit: Payer: Self-pay

## 2020-08-17 DIAGNOSIS — K810 Acute cholecystitis: Secondary | ICD-10-CM

## 2020-08-17 DIAGNOSIS — K8060 Calculus of gallbladder and bile duct with cholecystitis, unspecified, without obstruction: Secondary | ICD-10-CM | POA: Diagnosis not present

## 2020-08-17 DIAGNOSIS — K811 Chronic cholecystitis: Secondary | ICD-10-CM

## 2020-08-17 DIAGNOSIS — Z20822 Contact with and (suspected) exposure to covid-19: Secondary | ICD-10-CM | POA: Insufficient documentation

## 2020-08-17 HISTORY — DX: Other complications of anesthesia, initial encounter: T88.59XA

## 2020-08-17 HISTORY — PX: INTRAOPERATIVE CHOLANGIOGRAM: SHX5230

## 2020-08-17 LAB — SARS CORONAVIRUS 2 (TAT 6-24 HRS): SARS Coronavirus 2: NEGATIVE

## 2020-08-17 SURGERY — CHOLECYSTECTOMY, ROBOT-ASSISTED, LAPAROSCOPIC
Anesthesia: General

## 2020-08-17 MED ORDER — ACETAMINOPHEN 500 MG PO TABS: 1000.0000 mg | ORAL_TABLET | ORAL | Status: AC

## 2020-08-17 MED ORDER — FENTANYL CITRATE (PF) 100 MCG/2ML IJ SOLN
INTRAMUSCULAR | Status: AC
  Administered 2020-08-17: 50 ug via INTRAVENOUS
  Filled 2020-08-17: qty 2

## 2020-08-17 MED ORDER — EPHEDRINE 5 MG/ML INJ
INTRAVENOUS | Status: AC
  Filled 2020-08-17: qty 10

## 2020-08-17 MED ORDER — PROPOFOL 10 MG/ML IV BOLUS
INTRAVENOUS | Status: DC | PRN
  Administered 2020-08-17: 150 mg via INTRAVENOUS

## 2020-08-17 MED ORDER — FENTANYL CITRATE (PF) 100 MCG/2ML IJ SOLN
25.0000 ug | INTRAMUSCULAR | Status: DC | PRN
  Administered 2020-08-17: 50 ug via INTRAVENOUS

## 2020-08-17 MED ORDER — ORAL CARE MOUTH RINSE: 15.0000 mL | Freq: Once | OROMUCOSAL | Status: AC

## 2020-08-17 MED ORDER — BUPIVACAINE LIPOSOME 1.3 % IJ SUSP
INTRAMUSCULAR | Status: DC | PRN
  Administered 2020-08-17: 20 mL

## 2020-08-17 MED ORDER — PHENYLEPHRINE HCL (PRESSORS) 10 MG/ML IV SOLN
INTRAVENOUS | Status: DC | PRN
  Administered 2020-08-17 (×2): 100 ug via INTRAVENOUS
  Administered 2020-08-17: 200 ug via INTRAVENOUS
  Administered 2020-08-17: 100 ug via INTRAVENOUS

## 2020-08-17 MED ORDER — SUCCINYLCHOLINE CHLORIDE 20 MG/ML IJ SOLN
INTRAMUSCULAR | Status: DC | PRN
  Administered 2020-08-17: 100 mg via INTRAVENOUS

## 2020-08-17 MED ORDER — MIDAZOLAM HCL 2 MG/2ML IJ SOLN
INTRAMUSCULAR | Status: AC
  Filled 2020-08-17: qty 2

## 2020-08-17 MED ORDER — HYDROCODONE-ACETAMINOPHEN 5-325 MG PO TABS
1.0000 | ORAL_TABLET | Freq: Once | ORAL | Status: AC
  Administered 2020-08-17: 1 via ORAL

## 2020-08-17 MED ORDER — PROMETHAZINE HCL 25 MG/ML IJ SOLN: 6.2500 mg | INTRAMUSCULAR | Status: DC | PRN

## 2020-08-17 MED ORDER — FENTANYL CITRATE (PF) 100 MCG/2ML IJ SOLN
INTRAMUSCULAR | Status: DC | PRN
  Administered 2020-08-17: 25 ug via INTRAVENOUS
  Administered 2020-08-17: 50 ug via INTRAVENOUS
  Administered 2020-08-17: 25 ug via INTRAVENOUS
  Administered 2020-08-17 (×2): 50 ug via INTRAVENOUS

## 2020-08-17 MED ORDER — HYDROCODONE-ACETAMINOPHEN 5-325 MG PO TABS
ORAL_TABLET | ORAL | Status: AC
  Administered 2020-08-17: 1 via ORAL
  Filled 2020-08-17: qty 1

## 2020-08-17 MED ORDER — ACETAMINOPHEN 500 MG PO TABS
ORAL_TABLET | ORAL | Status: AC
  Administered 2020-08-17: 1000 mg via ORAL
  Filled 2020-08-17: qty 2

## 2020-08-17 MED ORDER — PROPOFOL 10 MG/ML IV BOLUS
INTRAVENOUS | Status: AC
  Filled 2020-08-17: qty 40

## 2020-08-17 MED ORDER — EPHEDRINE SULFATE 50 MG/ML IJ SOLN
INTRAMUSCULAR | Status: DC | PRN
  Administered 2020-08-17: 10 mg via INTRAVENOUS

## 2020-08-17 MED ORDER — CHLORHEXIDINE GLUCONATE CLOTH 2 % EX PADS: 6.0000 | MEDICATED_PAD | Freq: Once | CUTANEOUS | Status: DC

## 2020-08-17 MED ORDER — BUPIVACAINE LIPOSOME 1.3 % IJ SUSP
INTRAMUSCULAR | Status: AC
  Filled 2020-08-17: qty 20

## 2020-08-17 MED ORDER — ONDANSETRON HCL 4 MG/2ML IJ SOLN
INTRAMUSCULAR | Status: DC | PRN
  Administered 2020-08-17: 4 mg via INTRAVENOUS

## 2020-08-17 MED ORDER — DEXAMETHASONE SODIUM PHOSPHATE 10 MG/ML IJ SOLN
INTRAMUSCULAR | Status: AC
  Filled 2020-08-17: qty 1

## 2020-08-17 MED ORDER — CEFAZOLIN SODIUM-DEXTROSE 2-4 GM/100ML-% IV SOLN
INTRAVENOUS | Status: AC
  Filled 2020-08-17: qty 100

## 2020-08-17 MED ORDER — CHLORHEXIDINE GLUCONATE 0.12 % MT SOLN: 15.0000 mL | Freq: Once | OROMUCOSAL | Status: AC

## 2020-08-17 MED ORDER — KETOROLAC TROMETHAMINE 30 MG/ML IJ SOLN
INTRAMUSCULAR | Status: AC
  Filled 2020-08-17: qty 1

## 2020-08-17 MED ORDER — BUPIVACAINE-EPINEPHRINE (PF) 0.25% -1:200000 IJ SOLN
INTRAMUSCULAR | Status: DC | PRN
  Administered 2020-08-17: 30 mL

## 2020-08-17 MED ORDER — FENTANYL CITRATE (PF) 100 MCG/2ML IJ SOLN
INTRAMUSCULAR | Status: AC
  Filled 2020-08-17: qty 2

## 2020-08-17 MED ORDER — ROCURONIUM BROMIDE 100 MG/10ML IV SOLN
INTRAVENOUS | Status: DC | PRN
  Administered 2020-08-17 (×3): 20 mg via INTRAVENOUS
  Administered 2020-08-17: 10 mg via INTRAVENOUS
  Administered 2020-08-17: 30 mg via INTRAVENOUS

## 2020-08-17 MED ORDER — ACETAMINOPHEN 160 MG/5ML PO SOLN
325.0000 mg | ORAL | Status: DC | PRN
  Filled 2020-08-17: qty 20.3

## 2020-08-17 MED ORDER — ACETAMINOPHEN 325 MG PO TABS: 325.0000 mg | ORAL_TABLET | ORAL | Status: DC | PRN

## 2020-08-17 MED ORDER — CHLORHEXIDINE GLUCONATE 0.12 % MT SOLN
OROMUCOSAL | Status: AC
  Administered 2020-08-17: 15 mL via OROMUCOSAL
  Filled 2020-08-17: qty 15

## 2020-08-17 MED ORDER — ONDANSETRON HCL 4 MG/2ML IJ SOLN
INTRAMUSCULAR | Status: AC
  Filled 2020-08-17: qty 2

## 2020-08-17 MED ORDER — CELECOXIB 200 MG PO CAPS
ORAL_CAPSULE | ORAL | Status: AC
  Administered 2020-08-17: 200 mg via ORAL
  Filled 2020-08-17: qty 1

## 2020-08-17 MED ORDER — CEFAZOLIN SODIUM-DEXTROSE 2-4 GM/100ML-% IV SOLN
2.0000 g | INTRAVENOUS | Status: AC
  Administered 2020-08-17: 2 g via INTRAVENOUS

## 2020-08-17 MED ORDER — DROPERIDOL 2.5 MG/ML IJ SOLN
0.6250 mg | Freq: Once | INTRAMUSCULAR | Status: DC | PRN
  Filled 2020-08-17: qty 2

## 2020-08-17 MED ORDER — GABAPENTIN 300 MG PO CAPS
ORAL_CAPSULE | ORAL | Status: AC
  Administered 2020-08-17: 300 mg via ORAL
  Filled 2020-08-17: qty 1

## 2020-08-17 MED ORDER — ROCURONIUM BROMIDE 10 MG/ML (PF) SYRINGE
PREFILLED_SYRINGE | INTRAVENOUS | Status: AC
  Filled 2020-08-17: qty 10

## 2020-08-17 MED ORDER — CELECOXIB 200 MG PO CAPS: 200.0000 mg | ORAL_CAPSULE | ORAL | Status: AC

## 2020-08-17 MED ORDER — LIDOCAINE HCL (CARDIAC) PF 100 MG/5ML IV SOSY
PREFILLED_SYRINGE | INTRAVENOUS | Status: DC | PRN
  Administered 2020-08-17: 100 mg via INTRAVENOUS

## 2020-08-17 MED ORDER — LACTATED RINGERS IV SOLN: INTRAVENOUS | Status: DC

## 2020-08-17 MED ORDER — SUGAMMADEX SODIUM 500 MG/5ML IV SOLN
INTRAVENOUS | Status: DC | PRN
  Administered 2020-08-17: 200 mg via INTRAVENOUS

## 2020-08-17 MED ORDER — GLYCOPYRROLATE 0.2 MG/ML IJ SOLN
INTRAMUSCULAR | Status: DC | PRN
  Administered 2020-08-17: .2 mg via INTRAVENOUS

## 2020-08-17 MED ORDER — LIDOCAINE HCL (PF) 2 % IJ SOLN
INTRAMUSCULAR | Status: AC
  Filled 2020-08-17: qty 5

## 2020-08-17 MED ORDER — DEXAMETHASONE SODIUM PHOSPHATE 10 MG/ML IJ SOLN
INTRAMUSCULAR | Status: DC | PRN
  Administered 2020-08-17: 10 mg via INTRAVENOUS

## 2020-08-17 MED ORDER — HYDROCODONE-ACETAMINOPHEN 5-325 MG PO TABS: 1.0000 | ORAL_TABLET | ORAL | 0 refills | Status: DC | PRN

## 2020-08-17 MED ORDER — MIDAZOLAM HCL 2 MG/2ML IJ SOLN
INTRAMUSCULAR | Status: DC | PRN
  Administered 2020-08-17: 2 mg via INTRAVENOUS

## 2020-08-17 MED ORDER — BUPIVACAINE-EPINEPHRINE (PF) 0.25% -1:200000 IJ SOLN
INTRAMUSCULAR | Status: AC
  Filled 2020-08-17: qty 30

## 2020-08-17 MED ORDER — GABAPENTIN 300 MG PO CAPS: 300.0000 mg | ORAL_CAPSULE | ORAL | Status: AC

## 2020-08-17 MED ORDER — SODIUM CHLORIDE 0.9 % IV SOLN
INTRAVENOUS | Status: DC | PRN
  Administered 2020-08-17: 25 ug/min via INTRAVENOUS

## 2020-08-17 MED ORDER — HYDROCODONE-ACETAMINOPHEN 5-325 MG PO TABS: 1.0000 | ORAL_TABLET | Freq: Once | ORAL | Status: AC

## 2020-08-17 SURGICAL SUPPLY — 55 items
ADH SKN CLS APL DERMABOND .7 (GAUZE/BANDAGES/DRESSINGS) ×2
APL PRP STRL LF DISP 70% ISPRP (MISCELLANEOUS) ×2
BAG SPEC RTRVL LRG 6X4 10 (ENDOMECHANICALS) ×2
CANISTER SUCT 1200ML W/VALVE (MISCELLANEOUS) ×4 IMPLANT
CANNULA REDUC XI 12-8 STAPL (CANNULA) ×3
CANNULA REDUC XI 12-8MM STAPL (CANNULA) ×1
CANNULA REDUCER 12-8 DVNC XI (CANNULA) ×2 IMPLANT
CHLORAPREP W/TINT 26 (MISCELLANEOUS) ×4 IMPLANT
CLIP VESOLOCK MED LG 6/CT (CLIP) ×4 IMPLANT
COVER WAND RF STERILE (DRAPES) ×4 IMPLANT
DECANTER SPIKE VIAL GLASS SM (MISCELLANEOUS) ×4 IMPLANT
DEFOGGER SCOPE WARMER CLEARIFY (MISCELLANEOUS) ×4 IMPLANT
DERMABOND ADVANCED (GAUZE/BANDAGES/DRESSINGS) ×2
DERMABOND ADVANCED .7 DNX12 (GAUZE/BANDAGES/DRESSINGS) ×2 IMPLANT
DRAPE ARM DVNC X/XI (DISPOSABLE) ×8 IMPLANT
DRAPE COLUMN DVNC XI (DISPOSABLE) ×2 IMPLANT
DRAPE DA VINCI XI ARM (DISPOSABLE) ×16
DRAPE DA VINCI XI COLUMN (DISPOSABLE) ×4
ELECT CAUTERY BLADE 6.4 (BLADE) ×4 IMPLANT
ELECT REM PT RETURN 9FT ADLT (ELECTROSURGICAL) ×4
ELECTRODE REM PT RTRN 9FT ADLT (ELECTROSURGICAL) ×2 IMPLANT
GLOVE BIO SURGEON STRL SZ7 (GLOVE) ×8 IMPLANT
GOWN STRL REUS W/ TWL LRG LVL3 (GOWN DISPOSABLE) ×8 IMPLANT
GOWN STRL REUS W/TWL LRG LVL3 (GOWN DISPOSABLE) ×16
IRRIGATION STRYKERFLOW (MISCELLANEOUS) ×2 IMPLANT
IRRIGATOR STRYKERFLOW (MISCELLANEOUS) ×4
IRRIGATOR SUCT 8 DISP DVNC XI (IRRIGATION / IRRIGATOR) ×2 IMPLANT
IRRIGATOR SUCTION 8MM XI DISP (IRRIGATION / IRRIGATOR) ×4
IV NS 1000ML (IV SOLUTION) ×4
IV NS 1000ML BAXH (IV SOLUTION) ×2 IMPLANT
KIT PINK PAD W/HEAD ARE REST (MISCELLANEOUS) ×4
KIT PINK PAD W/HEAD ARM REST (MISCELLANEOUS) ×2 IMPLANT
LABEL OR SOLS (LABEL) ×4 IMPLANT
MANIFOLD NEPTUNE II (INSTRUMENTS) ×4 IMPLANT
NEEDLE HYPO 22GX1.5 SAFETY (NEEDLE) ×4 IMPLANT
NS IRRIG 500ML POUR BTL (IV SOLUTION) ×4 IMPLANT
OBTURATOR OPTICAL STANDARD 8MM (TROCAR) ×4
OBTURATOR OPTICAL STND 8 DVNC (TROCAR) ×2
OBTURATOR OPTICALSTD 8 DVNC (TROCAR) ×2 IMPLANT
PACK LAP CHOLECYSTECTOMY (MISCELLANEOUS) ×4 IMPLANT
PENCIL ELECTRO HAND CTR (MISCELLANEOUS) ×4 IMPLANT
POUCH SPECIMEN RETRIEVAL 10MM (ENDOMECHANICALS) ×4 IMPLANT
SCISSORS METZENBAUM CVD 33 (INSTRUMENTS) ×4 IMPLANT
SEAL CANN UNIV 5-8 DVNC XI (MISCELLANEOUS) ×6 IMPLANT
SEAL XI 5MM-8MM UNIVERSAL (MISCELLANEOUS) ×12
SET TUBE SMOKE EVAC HIGH FLOW (TUBING) ×4 IMPLANT
SOLUTION ELECTROLUBE (MISCELLANEOUS) ×4 IMPLANT
SPONGE LAP 18X18 RF (DISPOSABLE) ×4 IMPLANT
SPONGE LAP 4X18 RFD (DISPOSABLE) IMPLANT
STAPLER CANNULA SEAL DVNC XI (STAPLE) ×2 IMPLANT
STAPLER CANNULA SEAL XI (STAPLE) ×4
SUT MNCRL AB 4-0 PS2 18 (SUTURE) ×4 IMPLANT
SUT VICRYL 0 AB UR-6 (SUTURE) ×8 IMPLANT
TAPE TRANSPORE STRL 2 31045 (GAUZE/BANDAGES/DRESSINGS) ×4 IMPLANT
TROCAR BALLN GELPORT 12X130M (ENDOMECHANICALS) ×4 IMPLANT

## 2020-08-17 NOTE — Transfer of Care (Signed)
Immediate Anesthesia Transfer of Care Note  Patient: Carrie Hardy  Procedure(s) Performed: XI ROBOTIC ASSISTED LAPAROSCOPIC CHOLECYSTECTOMY (N/A ) INTRAOPERATIVE CHOLANGIOGRAM  Patient Location: PACU  Anesthesia Type:General  Level of Consciousness: awake, alert  and oriented  Airway & Oxygen Therapy: Patient Spontanous Breathing and Patient connected to face mask oxygen  Post-op Assessment: Report given to RN and Post -op Vital signs reviewed and stable  Post vital signs: Reviewed and stable  Last Vitals:  Vitals Value Taken Time  BP 108/82 08/17/20 1725  Temp    Pulse 91 08/17/20 1729  Resp 18 08/17/20 1729  SpO2 100 % 08/17/20 1729  Vitals shown include unvalidated device data.  Last Pain:  Vitals:   08/17/20 1438  TempSrc: Tympanic  PainSc:       Patients Stated Pain Goal: 0 (22/30/09 7949)  Complications: No complications documented.

## 2020-08-17 NOTE — Anesthesia Preprocedure Evaluation (Addendum)
Anesthesia Evaluation  Patient identified by MRN, date of birth, ID band Patient awake    Reviewed: Allergy & Precautions, H&P , NPO status , reviewed documented beta blocker date and time   Airway Mallampati: II  TM Distance: >3 FB Neck ROM: full    Dental  (+) Partial Upper, Partial Lower, Chipped, Missing, Caps   Pulmonary    Pulmonary exam normal        Cardiovascular hypertension, Normal cardiovascular exam  06/2020 ECHO IMPRESSIONS    1. Left ventricular ejection fraction, by estimation, is 35 to 40%. The  left ventricle has moderately decreased function. The left ventricle  demonstrates global hypokinesis. There is mild left ventricular  hypertrophy. Left ventricular diastolic  parameters are consistent with Grade I diastolic dysfunction (impaired  relaxation).  2. Right ventricular systolic function is low normal. The right  ventricular size is normal. There is normal pulmonary artery systolic  pressure.  3. Left atrial size was severely dilated.  4. Right atrial size was mildly dilated.  5. The mitral valve is normal in structure. Mild to moderate mitral valve  regurgitation. No evidence of mitral stenosis.  6. The aortic valve is tricuspid. Aortic valve regurgitation is mild. No  aortic stenosis is present.  Pt able to walk/climb stairs w/o DOE/CP   Neuro/Psych    GI/Hepatic GERD  Medicated and Controlled,  Endo/Other    Renal/GU Renal disease     Musculoskeletal   Abdominal   Peds  Hematology   Anesthesia Other Findings Past Medical History: No date: Hypertension Past Surgical History: 08/11/2020: IR CATHETER TUBE CHANGE   Reproductive/Obstetrics                            Anesthesia Physical Anesthesia Plan  ASA: III  Anesthesia Plan: General   Post-op Pain Management:    Induction: Intravenous  PONV Risk Score and Plan: Ondansetron and Treatment may  vary due to age or medical condition  Airway Management Planned: Oral ETT  Additional Equipment:   Intra-op Plan:   Post-operative Plan: Extubation in OR  Informed Consent: I have reviewed the patients History and Physical, chart, labs and discussed the procedure including the risks, benefits and alternatives for the proposed anesthesia with the patient or authorized representative who has indicated his/her understanding and acceptance.     Dental Advisory Given  Plan Discussed with: CRNA  Anesthesia Plan Comments:         Anesthesia Quick Evaluation

## 2020-08-17 NOTE — Interval H&P Note (Signed)
History and Physical Interval Note:  08/17/2020 1:48 PM  Carrie Hardy  has presented today for surgery, with the diagnosis of Biliary colie.  The various methods of treatment have been discussed with the patient and family. After consideration of risks, benefits and other options for treatment, the patient has consented to  Procedure(s): XI ROBOTIC [place] (N/A) as a surgical intervention.  The patient's history has been reviewed, patient examined, no change in status, stable for surgery.  I have reviewed the patient's chart and labs.  Questions were answered to the patient's satisfaction.     [place]

## 2020-08-17 NOTE — Anesthesia Procedure Notes (Signed)
Procedure Name: Intubation Performed by: Fletcher-Harrison, Aven Christen, CRNA Pre-anesthesia Checklist: Patient identified, Emergency Drugs available, Suction available and Patient being monitored Patient Re-evaluated:Patient Re-evaluated prior to induction Oxygen Delivery Method: Circle system utilized Preoxygenation: Pre-oxygenation with 100% oxygen Induction Type: IV induction Ventilation: Mask ventilation without difficulty Laryngoscope Size: McGraph and 3 Grade View: Grade I Tube type: Oral Tube size: 6.5 mm Number of attempts: 1 Airway Equipment and Method: Stylet and Oral airway Placement Confirmation: ETT inserted through vocal cords under direct vision,  positive ETCO2,  breath sounds checked- equal and bilateral and CO2 detector Secured at: 21 cm Tube secured with: Tape Dental Injury: Teeth and Oropharynx as per pre-operative assessment        

## 2020-08-17 NOTE — Discharge Instructions (Addendum)
AMBULATORY SURGERY  DISCHARGE INSTRUCTIONS   1) The drugs that you were given will stay in your system until tomorrow so for the next 24 hours you should not:  A) Drive an automobile B) Make any legal decisions C) Drink any alcoholic beverage   2) You may resume regular meals tomorrow.  Today it is better to start with liquids and gradually work up to solid foods.  You may eat anything you prefer, but it is better to start with liquids, then soup and crackers, and gradually work up to solid foods.   3) Please notify your doctor immediately if you have any unusual bleeding, trouble breathing, redness and pain at the surgery site, drainage, fever, or pain not relieved by medication.    4) Additional Instructions:        Please contact your physician with any problems or Same Day Surgery at [phone], Monday through Friday 6 am to 4 pm, or [place] at [facility] number at [phone].Laparoscopic Cholecystectomy, Care After   These instructions give you information on caring for yourself after your procedure. Your doctor may also give you more specific instructions. Call your doctor if you have any problems or questions after your procedure.  HOME CARE  Change your bandages (dressings) as told by your doctor.  Keep the wound dry and clean. Wash the wound gently with soap and water. Pat the wound dry with a clean towel.  Do not take baths, swim, or use hot tubs for 2 weeks, or as told by your doctor.  Only take medicine as told by your doctor.  Eat a normal diet as told by your doctor.  Do not lift anything heavier than 10 pounds (4.5 kg) until your doctor says it is okay.  Do not play contact sports for 1 week, or as told by your doctor. GET HELP IF:  Your wound is red, puffy (swollen), or painful.  You have yellowish-white fluid (pus) coming from the wound.  You have fluid draining from the wound for more than 1 day.  You have a bad smell coming from the wound.   Your wound breaks open. GET HELP RIGHT AWAY IF:  You have trouble breathing.  You have chest pain.  You have a fever >101  You have pain in the shoulders (shoulder strap areas) that is getting worse.  You feel dizzy or pass out (faint).  You have severe belly (abdominal) pain.  You feel sick to your stomach (nauseous) or throw up (vomit) for more than 1 day.

## 2020-08-17 NOTE — Op Note (Signed)
Robotic assisted laparoscopic Cholecystectomy  Pre-operative Diagnosis: chronic cholecystitis  Post-operative Diagnosis: same  Procedure:  Robotic assisted laparoscopic Cholecystectomy with intraoperative cholangiogram and removal of cholecystostomy tube  Surgeon: Caroleen Hamman, MD FACS  Anesthesia: Gen. with endotracheal tube  Findings: Chronic severe Cholecystitis  Extensive adhesions from the colon to abdominal wall and omentum to the abdominal wall Normal cholangiogram Friable tissue due to chronic inflammation   Estimated Blood Loss:50 cc       Specimens: Gallbladder           Complications: none   Procedure Details  The patient was seen again in the Holding Room. The benefits, complications, treatment options, and expected outcomes were discussed with the patient. The risks of bleeding, infection, recurrence of symptoms, failure to resolve symptoms, bile duct damage, bile duct leak, retained common bile duct stone, bowel injury, any of which could require further surgery and/or ERCP, stent, or papillotomy were reviewed with the patient. The likelihood of improving the patient's symptoms with return to their baseline status is good.  The patient and/or family concurred with the proposed plan, giving informed consent.  The patient was taken to Operating Room, identified  and the procedure verified as Laparoscopic Cholecystectomy.  A Time Out was held and the above information confirmed.  Prior to the induction of general anesthesia, antibiotic prophylaxis was administered. VTE prophylaxis was in place. General endotracheal anesthesia was then administered and tolerated well. After the induction, I injected the cholecystostomy tube with conrad contrast and performed a cholangiogram. There was contrast flowing both antegrade and retrograde. No evidence of biliary obstruction or filling defects were seen. Contrast observed in the duodenum.  The abdomen was prepped with Chloraprep and  draped in the sterile fashion. The patient was positioned in the supine position.  Cut down technique was used to enter the abdominal cavity and a Hasson trochar was placed after two vicryl stitches were anchored to the fascia. Pneumoperitoneum was then created with CO2 and tolerated well without any adverse changes in the patient's vital signs.  Initially two 8-mm ports were placed under direct vision. On the lateral aspect the colon was adhere to the abdominal wall and the omentum was also adhere to the GB. Extensive LOA performed with scissors, we were meticulous and avoided any injuries. We had to the the LOA in order to have appropriate exposure.  We then were able to place the third 8 mm port.   The patient was positioned  in reverse Trendelenburg, robot was brought to the surgical field and docked in the standard fashion.  We made sure all the instrumentation was kept indirect view at all times and that there were no collision between the arms. I scrubbed out and went to the console.  The gallbladder was identified, the fundus grasped and retracted cephalad. Adhesions were lysed bluntly. The infundibulum was grasped and retracted laterally, exposing the peritoneum overlying the triangle of Calot. This was then divided and exposed in a blunt fashion. An extended critical view of the cystic duct and cystic artery was obtained.  The cystic duct was clearly identified and bluntly dissected.   Artery and duct were double clipped and divided. Using ICG cholangiography we visualize the cystic duct and CBD , no evidence of leak or injuries were seen. The gallbladder was taken from the gallbladder fossa in a retrograde fashion with the electrocautery.  Hemostasis was achieved with the electrocautery. inspection of the right upper quadrant was performed. No bleeding, bile duct injury or leak, or  bowel injury was noted.  The gallbladder was  placed in an Endocatch bag.   Robotic instruments and robotic  arms were undocked in the standard fashion.  I scrubbed back in.  Pneumoperitoneum was released AND SPECIMEN REMOVED..  The periumbilical port site was closed with interrumpted 0 Vicryl sutures. 4-0 subcuticular Monocryl was used to close the skin. Dermabond was  applied.  The patient was then extubated and brought to the recovery room in stable condition. Sponge, lap, and needle counts were correct at closure and at the conclusion of the case.  LIPOSOMAL MARCAINE injected at all port sites.              Caroleen Hamman, MD, FACS

## 2020-08-17 NOTE — Interval H&P Note (Signed)
History and Physical Interval Note:  08/17/2020 1:44 PM  Carrie Hardy  has presented today for surgery, with the diagnosis of Biliary colie.  The various methods of treatment have been discussed with the patient and family. After consideration of risks, benefits and other options for treatment, the patient has consented to  Procedure(s): XI ROBOTIC [place] (N/A) as a surgical intervention.  The patient's history has been reviewed, patient examined, no change in status, stable for surgery.  I have reviewed the patient's chart and labs.  Questions were answered to the patient's satisfaction.     [place]

## 2020-08-17 NOTE — Anesthesia Postprocedure Evaluation (Signed)
Anesthesia Post Note  Patient: Carrie Hardy  Procedure(s) Performed: XI ROBOTIC ASSISTED LAPAROSCOPIC CHOLECYSTECTOMY (N/A ) INTRAOPERATIVE CHOLANGIOGRAM  Patient location during evaluation: PACU Anesthesia Type: General Level of consciousness: awake and alert Pain management: pain level controlled Vital Signs Assessment: post-procedure vital signs reviewed and stable Respiratory status: spontaneous breathing, nonlabored ventilation, respiratory function stable and patient connected to nasal cannula oxygen Cardiovascular status: blood pressure returned to baseline and stable Postop Assessment: no apparent nausea or vomiting Anesthetic complications: no   No complications documented.   Last Vitals:  Vitals:   08/17/20 1740 08/17/20 1758  BP: 112/79 115/77  Pulse: 89 87  Resp: 18 14  Temp:    SpO2: 100% 96%    Last Pain:  Vitals:   08/17/20 1758  TempSrc:   PainSc: Asleep                 Arita Miss

## 2020-08-18 ENCOUNTER — Other Ambulatory Visit: Admission: RE | Admit: 2020-08-18 | Payer: Federal, State, Local not specified - PPO | Source: Ambulatory Visit

## 2020-08-18 ENCOUNTER — Encounter: Payer: Self-pay | Admitting: Surgery

## 2020-08-21 LAB — SURGICAL PATHOLOGY

## 2020-08-23 ENCOUNTER — Other Ambulatory Visit: Payer: Federal, State, Local not specified - PPO

## 2020-08-30 ENCOUNTER — Encounter: Payer: Self-pay | Admitting: Surgery

## 2020-08-30 ENCOUNTER — Ambulatory Visit (INDEPENDENT_AMBULATORY_CARE_PROVIDER_SITE_OTHER): Payer: Federal, State, Local not specified - PPO | Admitting: Surgery

## 2020-08-30 ENCOUNTER — Other Ambulatory Visit: Payer: Self-pay

## 2020-08-30 VITALS — BP 126/83 | HR 120 | Temp 98.6°F | Ht 65.0 in | Wt 168.0 lb

## 2020-08-30 DIAGNOSIS — Z09 Encounter for follow-up examination after completed treatment for conditions other than malignant neoplasm: Secondary | ICD-10-CM

## 2020-08-30 NOTE — Patient Instructions (Addendum)
The fluid in your belly button will continue to be absorbed by your body. This may take several months.  Follow up in 8 weeks.   GENERAL POST-OPERATIVE PATIENT INSTRUCTIONS   WOUND CARE INSTRUCTIONS:  Keep a dry clean dressing on the wound if there is drainage. The initial bandage may be removed after 24 hours.  Once the wound has quit draining you may leave it open to air.  If clothing rubs against the wound or causes irritation and the wound is not draining you may cover it with a dry dressing during the daytime.  Try to keep the wound dry and avoid ointments on the wound unless directed to do so.  If the wound becomes bright red and painful or starts to drain infected material that is not clear, please contact your physician immediately.  If the wound is mildly pink and has a thick firm ridge underneath it, this is normal, and is referred to as a healing ridge.  This will resolve over the next 4-6 weeks.  BATHING: You may shower if you have been informed of this by your surgeon. However, Please do not submerge in a tub, hot tub, or pool until incisions are completely sealed or have been told by your surgeon that you may do so.  DIET:  You may eat any foods that you can tolerate.  It is a good idea to eat a high fiber diet and take in plenty of fluids to prevent constipation.  If you do become constipated you may want to take a mild laxative or take ducolax tablets on a daily basis until your bowel habits are regular.  Constipation can be very uncomfortable, along with straining, after recent surgery.  ACTIVITY:  You are encouraged to cough and deep breath or use your incentive spirometer if you were given one, every 15-30 minutes when awake.  This will help prevent respiratory complications and low grade fevers post-operatively if you had a general anesthetic.  You may want to hug a pillow when coughing and sneezing to add additional support to the surgical area, if you had abdominal or chest  surgery, which will decrease pain during these times.  You are encouraged to walk and engage in light activity for the next two weeks.  You should not lift more than 20 pounds for 6 weeks after surgery as it could put you at increased risk for complications.  Twenty pounds is roughly equivalent to a plastic bag of groceries. At that time- Listen to your body when lifting, if you have pain when lifting, stop and then try again in a few days. Soreness after doing exercises or activities of daily living is normal as you get back in to your normal routine.  MEDICATIONS:  Try to take narcotic medications and anti-inflammatory medications, such as tylenol, ibuprofen, naprosyn, etc., with food.  This will minimize stomach upset from the medication.  Should you develop nausea and vomiting from the pain medication, or develop a rash, please discontinue the medication and contact your physician.  You should not drive, make important decisions, or operate machinery when taking narcotic pain medication.  SUNBLOCK Use sun block to incision area over the next year if this area will be exposed to sun. This helps decrease scarring and will allow you avoid a permanent darkened area over your incision.  QUESTIONS:  Please feel free to call our office if you have any questions, and we will be glad to assist you.

## 2020-09-01 NOTE — Progress Notes (Signed)
59 year old female status post robotic cholecystectomy.  Doing well.  No fevers no chills.  Tolerating diet and having bowel movements.  PE NAD Abd: soft, nt, incisions c/d/i, small seroma around periumbilical incision, no infection  A/P doing well Seroma likely to resolve in a few weeks rtc 4-6 weeks

## 2020-10-25 ENCOUNTER — Encounter: Payer: Self-pay | Admitting: Surgery

## 2020-10-25 ENCOUNTER — Ambulatory Visit (INDEPENDENT_AMBULATORY_CARE_PROVIDER_SITE_OTHER): Payer: Federal, State, Local not specified - PPO | Admitting: Surgery

## 2020-10-25 ENCOUNTER — Other Ambulatory Visit: Payer: Self-pay

## 2020-10-25 VITALS — BP 160/110 | HR 99 | Temp 98.1°F | Ht 65.0 in | Wt 168.0 lb

## 2020-10-25 DIAGNOSIS — Z09 Encounter for follow-up examination after completed treatment for conditions other than malignant neoplasm: Secondary | ICD-10-CM

## 2020-10-25 NOTE — Patient Instructions (Signed)
   Follow-up with our office as needed.  Please call and ask to speak with a nurse if you develop questions or concerns.  

## 2020-10-26 ENCOUNTER — Encounter: Payer: Self-pay | Admitting: Surgery

## 2020-10-26 NOTE — Progress Notes (Signed)
Status post robotic cholecystectomy.  She developed seroma.  Now, has completely resolved.  No fevers no chills  PE NAD Abd: soft, nt, no seroma or hernia  A/P Doing well Seroma resolved RTC prn

## 2021-02-19 ENCOUNTER — Ambulatory Visit: Payer: Federal, State, Local not specified - PPO | Admitting: Surgery

## 2021-02-19 ENCOUNTER — Other Ambulatory Visit: Payer: Self-pay

## 2021-02-19 ENCOUNTER — Encounter: Payer: Self-pay | Admitting: Surgery

## 2021-02-19 VITALS — BP 132/74 | HR 98 | Temp 98.1°F | Ht 65.0 in | Wt 162.8 lb

## 2021-02-19 DIAGNOSIS — K432 Incisional hernia without obstruction or gangrene: Secondary | ICD-10-CM

## 2021-02-19 NOTE — Patient Instructions (Addendum)
You have requested for your Umbilical Hernia be repaired. This will be scheduled on 03/08/21 with Dr. Dahlia Byes at [facility].  Please see your (blue)pre-care sheet for information. Our surgery scheduler will call you to look at surgery dates and to go over information.   You will need to arrange to be off work for 1-2 weeks but will have to have a lifting restriction of no more than 15 lbs for 6 weeks following your surgery.   Ventral Hernia, Adult A hernia is a bulge of tissue that pushes through an opening between muscles. An umbilical hernia happens in the abdomen, near the belly button (umbilicus). The hernia may contain tissues from the small intestine, large intestine, or fatty tissue covering the intestines (omentum). Umbilical hernias in adults tend to get worse over time, and they require surgical treatment. There are several types of umbilical hernias. You may have: A hernia located just above or below the umbilicus (indirect hernia). This is the most common type of umbilical hernia in adults. A hernia that forms through an opening formed by the umbilicus (direct hernia). A hernia that comes and goes (reducible hernia). A reducible hernia may be visible only when you strain, lift something heavy, or cough. This type of hernia can be pushed back into the abdomen (reduced). A hernia that traps abdominal tissue inside the hernia (incarcerated hernia). This type of hernia cannot be reduced. A hernia that cuts off blood flow to the tissues inside the hernia (strangulated hernia). The tissues can start to die if this happens. This type of hernia requires emergency treatment.  What are the causes? An ventral hernia happens when tissue inside the abdomen presses on a weak area of the abdominal muscles. What increases the risk? You may have a greater risk of this condition if you: Are obese. Have had several pregnancies. Have a buildup of fluid inside your abdomen  (ascites). Have had surgery that weakens the abdominal muscles.  What are the signs or symptoms? The main symptom of this condition is a painless bulge at or near the belly button. A reducible hernia may be visible only when you strain, lift something heavy, or cough. Other symptoms may include: Dull pain. A feeling of pressure.  Symptoms of a strangulated hernia may include: Pain that gets increasingly worse. Nausea and vomiting. Pain when pressing on the hernia. Skin over the hernia becoming red or purple. Constipation. Blood in the stool.  How is this diagnosed? This condition may be diagnosed based on: A physical exam. You may be asked to cough or strain while standing. These actions increase the pressure inside your abdomen and force the hernia through the opening in your muscles. Your health care provider may try to reduce the hernia by pressing on it. Your symptoms and medical history.  How is this treated? Surgery is the only treatment for an umbilical hernia. Surgery for a strangulated hernia is done as soon as possible. If you have a small hernia that is not incarcerated, you may need to lose weight before having surgery. Follow these instructions at home: Lose weight, if told by your health care provider. Do not try to push the hernia back in. Watch your hernia for any changes in color or size. Tell your health care provider if any changes occur. You may need to avoid activities that increase pressure on your hernia. Do not lift anything that is heavier than 10 lb (4.5 kg) until your health care provider says that this is  safe. Take over-the-counter and prescription medicines only as told by your health care provider. Keep all follow-up visits as told by your health care provider. This is important. Contact a health care provider if: Your hernia gets larger. Your hernia becomes painful. Get help right away if: You develop sudden, severe pain near the area of your  hernia. You have pain as well as nausea or vomiting. You have pain and the skin over your hernia changes color. You develop a fever. This information is not intended to replace advice given to you by your health care provider. Make sure you discuss any questions you have with your health care provider. Document Released: 01/26/2016 Document Revised: 04/28/2016 Document Reviewed: 01/26/2016 Elsevier Interactive Patient Education  [employer].

## 2021-02-20 ENCOUNTER — Telehealth: Payer: Self-pay | Admitting: Surgery

## 2021-02-20 NOTE — Telephone Encounter (Signed)
Outgoing call is made, left message for patient to call.     Please advise patient of Pre-Admission date/time, COVID Testing date and Surgery date.  Surgery Date: 03/08/21 Preadmission Testing Date: 02/28/21 (phone 1p-5p) Covid Testing Date: Not needed.     Patient has been made aware to call [phone], between 1-3:00pm the day before surgery, to find out what time to arrive for surgery.

## 2021-02-21 ENCOUNTER — Encounter: Payer: Self-pay | Admitting: Surgery

## 2021-02-21 NOTE — Progress Notes (Signed)
Outpatient Surgical Follow Up  02/21/2021  Carrie Hardy is an 60 y.o. female.   Chief Complaint  Patient presents with   Follow-up    HPI: Carrie Hardy is a 60 year old female well-known to me with a prior history of acute cholecystitis last year requiring cholecystostomy tube's.  Subsequently I performed a cholecystectomy several months ago.  She does have significant issues including failure to thrive, malnutrition some point in time she did have a gastric bypass several years ago.  Comes in now for a bulge around the periumbilical incision.  She also reports some intermittent pain that is mild sharp.  No specific alleviating or aggravating factors.  She seems to have recovered from the acute illness back last year. She seems that she has regained her strength as compared to when I first saw her last year in the hospital. She continues to smoke  Past Medical History:  Diagnosis Date   Complication of anesthesia    1980's, respiratory arrest during surgery   Hypertension     Past Surgical History:  Procedure Laterality Date   DILATION AND CURETTAGE OF UTERUS  1980   miscarriage   GASTRIC BYPASS  2005   INTRAOPERATIVE CHOLANGIOGRAM  08/17/2020   Procedure: INTRAOPERATIVE CHOLANGIOGRAM;  Surgeon: Jules Husbands, MD;  Location: [facility] ORS;  Service: General;;   IR CATHETER TUBE CHANGE  08/11/2020    No family history on file.  Social History:  reports that she has been smoking cigarettes. She has been smoking an average of 1.00 packs per day. She has never used smokeless tobacco. She reports current alcohol use. She reports previous drug use.  Allergies:  Allergies  Allergen Reactions   Other Other (See Comments)    Anesthesia medications in 1980 caused respiratory arrest   Tramadol Itching    Medications reviewed.    ROS Full ROS performed and is otherwise negative other than what is stated in HPI   BP 132/74   Pulse 98   Temp 98.1 F (36.7 C)   Ht 5\' 5"  (1.651 m)    Wt 162 lb 12.8 oz (73.8 kg)   SpO2 97%   BMI 27.09 kg/m   Physical Exam Vitals and nursing note reviewed. Exam conducted with a chaperone present.  Constitutional:      Appearance: Normal appearance. She is normal weight.  Eyes:     General:        Right eye: No discharge.        Left eye: No discharge.  Cardiovascular:     Rate and Rhythm: Normal rate and regular rhythm.     Pulses: Normal pulses.     Heart sounds: Normal heart sounds. No murmur heard.   No gallop.  Pulmonary:     Effort: Pulmonary effort is normal. No respiratory distress.     Breath sounds: No stridor. No wheezing or rales.  Abdominal:     General: Abdomen is flat. There is no distension.     Palpations: Abdomen is soft. There is no mass.     Tenderness: There is no right CVA tenderness, left CVA tenderness, guarding or rebound.     Hernia: A hernia is present.     Comments: Reducible periumbilical incisional hernia, no peritonitis  Musculoskeletal:        General: Normal range of motion.     Cervical back: Normal range of motion and neck supple. No rigidity or tenderness.  Skin:    General: Skin is warm.     Capillary  Refill: Capillary refill takes less than 2 seconds.  Neurological:     General: No focal deficit present.     Mental Status: She is alert and oriented to person, place, and time.  Psychiatric:        Mood and Affect: Mood normal.        Behavior: Behavior normal.        Thought Content: Thought content normal.        Judgment: Judgment normal.        Assessment/Plan: 61 year old female with incisional hernia not surprising given her malnutrition comorbidities and prior gastric bypass.  Given that is an incisional hernia I do think is prudent to go ahead and repair this.  I do think that is small enough that we can do an open repair with mesh without the need for robotic approach.  Procedure discussed with the patient in detail.  Risks, benefits and possible complications including  but not limited to: Bleeding, infection, injury to adjacent structures and recurrence.  She also understands that pain symptoms can be associated to this.  She wishes to move forward and understands the situation.  Greater than 50% of the 45 minutes  visit was spent in counseling/coordination of care   Caroleen Hamman, MD [place] Surgeon

## 2021-02-21 NOTE — H&P (View-Only) (Signed)
Outpatient Surgical Follow Up  02/21/2021  Carrie Hardy is an 60 y.o. female.   Chief Complaint  Patient presents with   Follow-up    HPI: Carrie Hardy is a 60 year old female well-known to me with a prior history of acute cholecystitis last year requiring cholecystostomy tube's.  Subsequently I performed a cholecystectomy several months ago.  She does have significant issues including failure to thrive, malnutrition some point in time she did have a gastric bypass several years ago.  Comes in now for a bulge around the periumbilical incision.  She also reports some intermittent pain that is mild sharp.  No specific alleviating or aggravating factors.  She seems to have recovered from the acute illness back last year. She seems that she has regained her strength as compared to when I first saw her last year in the hospital. She continues to smoke  Past Medical History:  Diagnosis Date   Complication of anesthesia    1980's, respiratory arrest during surgery   Hypertension     Past Surgical History:  Procedure Laterality Date   DILATION AND CURETTAGE OF UTERUS  1980   miscarriage   GASTRIC BYPASS  2005   INTRAOPERATIVE CHOLANGIOGRAM  08/17/2020   Procedure: INTRAOPERATIVE CHOLANGIOGRAM;  Surgeon: Jules Husbands, MD;  Location: [facility] ORS;  Service: General;;   IR CATHETER TUBE CHANGE  08/11/2020    No family history on file.  Social History:  reports that she has been smoking cigarettes. She has been smoking an average of 1.00 packs per day. She has never used smokeless tobacco. She reports current alcohol use. She reports previous drug use.  Allergies:  Allergies  Allergen Reactions   Other Other (See Comments)    Anesthesia medications in 1980 caused respiratory arrest   Tramadol Itching    Medications reviewed.    ROS Full ROS performed and is otherwise negative other than what is stated in HPI   BP 132/74   Pulse 98   Temp 98.1 F (36.7 C)   Ht 5\' 5"  (1.651 m)    Wt 162 lb 12.8 oz (73.8 kg)   SpO2 97%   BMI 27.09 kg/m   Physical Exam Vitals and nursing note reviewed. Exam conducted with a chaperone present.  Constitutional:      Appearance: Normal appearance. She is normal weight.  Eyes:     General:        Right eye: No discharge.        Left eye: No discharge.  Cardiovascular:     Rate and Rhythm: Normal rate and regular rhythm.     Pulses: Normal pulses.     Heart sounds: Normal heart sounds. No murmur heard.   No gallop.  Pulmonary:     Effort: Pulmonary effort is normal. No respiratory distress.     Breath sounds: No stridor. No wheezing or rales.  Abdominal:     General: Abdomen is flat. There is no distension.     Palpations: Abdomen is soft. There is no mass.     Tenderness: There is no right CVA tenderness, left CVA tenderness, guarding or rebound.     Hernia: A hernia is present.     Comments: Reducible periumbilical incisional hernia, no peritonitis  Musculoskeletal:        General: Normal range of motion.     Cervical back: Normal range of motion and neck supple. No rigidity or tenderness.  Skin:    General: Skin is warm.     Capillary  Refill: Capillary refill takes less than 2 seconds.  Neurological:     General: No focal deficit present.     Mental Status: She is alert and oriented to person, place, and time.  Psychiatric:        Mood and Affect: Mood normal.        Behavior: Behavior normal.        Thought Content: Thought content normal.        Judgment: Judgment normal.        Assessment/Plan: 60 year old female with incisional hernia not surprising given her malnutrition comorbidities and prior gastric bypass.  Given that is an incisional hernia I do think is prudent to go ahead and repair this.  I do think that is small enough that we can do an open repair with mesh without the need for robotic approach.  Procedure discussed with the patient in detail.  Risks, benefits and possible complications including  but not limited to: Bleeding, infection, injury to adjacent structures and recurrence.  She also understands that pain symptoms can be associated to this.  She wishes to move forward and understands the situation.  Greater than 50% of the 45 minutes  visit was spent in counseling/coordination of care   Caroleen Hamman, MD [place] Surgeon

## 2021-02-26 NOTE — Telephone Encounter (Signed)
Another message has been left for the patient to call me.

## 2021-02-28 ENCOUNTER — Other Ambulatory Visit: Payer: Self-pay

## 2021-02-28 ENCOUNTER — Encounter
Admission: RE | Admit: 2021-02-28 | Discharge: 2021-02-28 | Disposition: A | Discharge status: Home / Self Care | Payer: Federal, State, Local not specified - PPO | Source: Ambulatory Visit | Attending: Surgery | Admitting: Surgery

## 2021-02-28 HISTORY — DX: Unspecified osteoarthritis, unspecified site: M19.90

## 2021-02-28 HISTORY — DX: Sleep apnea, unspecified: G47.30

## 2021-02-28 HISTORY — DX: Family history of other specified conditions: Z84.89

## 2021-02-28 HISTORY — DX: Anemia, unspecified: D64.9

## 2021-02-28 HISTORY — DX: Fatty (change of) liver, not elsewhere classified: K76.0

## 2021-02-28 HISTORY — DX: Gastro-esophageal reflux disease without esophagitis: K21.9

## 2021-02-28 NOTE — Patient Instructions (Signed)
Your procedure is scheduled on:03-08-21 THURSDAY Report to the Registration Desk on the 1st floor of the [facility]-Then proceed to the 2nd floor Surgery Desk in the [place] To find out your arrival time, please call [phone] between 1PM - 3PM on:03-07-21 WEDNESDAY  REMEMBER: Instructions that are not followed completely may result in serious medical risk, up to and including death; or upon the discretion of your surgeon and anesthesiologist your surgery may need to be rescheduled.  Do not eat food after midnight the night before surgery.  No gum chewing, lozengers or hard candies.  You may however, drink CLEAR liquids up to 2 hours before you are scheduled to arrive for your surgery. Do not drink anything within 2 hours of your scheduled arrival time.  Clear liquids include: - water  - apple juice without pulp - gatorade (not RED, PURPLE, OR BLUE) - black coffee or tea (Do NOT add milk or creamers to the coffee or tea) Do NOT drink anything that is not on this list.  TAKE THESE MEDICATIONS THE MORNING OF SURGERY WITH A SIP OF WATER: -Metoprolol (Toprol) -Protonix (Pantoprazole)-take one the night before and one on the morning of surgery - helps to prevent nausea after surgery.  One week prior to surgery: Stop Anti-inflammatories (NSAIDS) such as Advil, Aleve, Ibuprofen, Motrin, Naproxen, Naprosyn and Aspirin based products such as Excedrin, Goodys Powder, BC Powder.You may however, continue to take Tylenol if needed for pain up until the day of surgery.  Stop any vitamins/supplements 7 days prior to surgery  No Alcohol for 24 hours before or after surgery.  No Smoking including e-cigarettes for 24 hours prior to surgery.  No chewable tobacco products for at least 6 hours prior to surgery.  No nicotine patches on the day of surgery.  Do not use any "recreational" drugs for at least a week prior to your surgery.  Please be advised that the combination of cocaine and  anesthesia may have negative outcomes, up to and including death. If you test positive for cocaine, your surgery will be cancelled.  On the morning of surgery brush your teeth with toothpaste and water, you may rinse your mouth with mouthwash if you wish. Do not swallow any toothpaste or mouthwash.  Do not wear jewelry, make-up, hairpins, clips or nail polish.  Do not wear lotions, powders, or perfumes.   Do not shave body from the neck down 48 hours prior to surgery just in case you cut yourself which could leave a site for infection.  Also, freshly shaved skin may become irritated if using the CHG soap.  Contact lenses, hearing aids and dentures may not be worn into surgery.  Do not bring valuables to the hospital. [facility] is not responsible for any missing/lost belongings or valuables.   Use CHG Soap as directed on instruction sheet  Notify your doctor if there is any change in your medical condition (cold, fever, infection).  Wear comfortable clothing (specific to your surgery type) to the hospital.  After surgery, you can help prevent lung complications by doing breathing exercises.  Take deep breaths and cough every 1-2 hours. Your doctor may order a device called an Incentive Spirometer to help you take deep breaths. When coughing or sneezing, hold a pillow firmly against your incision with both hands. This is called "splinting." Doing this helps protect your incision. It also decreases belly discomfort.  If you are being admitted to the hospital overnight, leave your suitcase in the car. After  surgery it may be brought to your room.  If you are being discharged the day of surgery, you will not be allowed to drive home. You will need a responsible adult (18 years or older) to drive you home and stay with you that night.   If you are taking public transportation, you will need to have a responsible adult (18 years or older) with you. Please confirm with your physician that  it is acceptable to use public transportation.   Please call the [facility] at [phone] if you have any questions about these instructions.  Surgery Visitation Policy:  Patients undergoing a surgery or procedure may have one family member or support person with them as long as that person is not COVID-19 positive or experiencing its symptoms.  That person may remain in the waiting area during the procedure.  Inpatient Visitation:    Visiting hours are 7 a.m. to 8 p.m. Inpatients will be allowed two visitors daily. The visitors may change each day during the patient's stay. No visitors under the age of 39. Any visitor under the age of 5 must be accompanied by an adult. The visitor must pass COVID-19 screenings, use hand sanitizer when entering and exiting the patient's room and wear a mask at all times, including in the patient's room. Patients must also wear a mask when staff or their visitor are in the room. Masking is required regardless of vaccination status.

## 2021-02-28 NOTE — Telephone Encounter (Signed)
Outgoing call again, patient finally available to speak with.  She is informed that nurse from pre admissions will be calling her.  Patient informed of her surgery date, verbalized understanding.

## 2021-03-05 ENCOUNTER — Other Ambulatory Visit: Payer: Self-pay

## 2021-03-05 ENCOUNTER — Encounter
Admission: RE | Admit: 2021-03-05 | Discharge: 2021-03-05 | Disposition: A | Discharge status: Home / Self Care | Payer: Federal, State, Local not specified - PPO | Source: Ambulatory Visit | Attending: Surgery | Admitting: Surgery

## 2021-03-05 DIAGNOSIS — Z888 Allergy status to other drugs, medicaments and biological substances status: Secondary | ICD-10-CM | POA: Diagnosis not present

## 2021-03-05 DIAGNOSIS — I10 Essential (primary) hypertension: Secondary | ICD-10-CM | POA: Insufficient documentation

## 2021-03-05 DIAGNOSIS — Z9049 Acquired absence of other specified parts of digestive tract: Secondary | ICD-10-CM | POA: Diagnosis not present

## 2021-03-05 DIAGNOSIS — Z01818 Encounter for other preprocedural examination: Secondary | ICD-10-CM | POA: Insufficient documentation

## 2021-03-05 DIAGNOSIS — Z885 Allergy status to narcotic agent status: Secondary | ICD-10-CM | POA: Diagnosis not present

## 2021-03-05 DIAGNOSIS — F17210 Nicotine dependence, cigarettes, uncomplicated: Secondary | ICD-10-CM | POA: Diagnosis not present

## 2021-03-05 DIAGNOSIS — Z884 Allergy status to anesthetic agent status: Secondary | ICD-10-CM | POA: Diagnosis not present

## 2021-03-05 DIAGNOSIS — Z9884 Bariatric surgery status: Secondary | ICD-10-CM | POA: Diagnosis not present

## 2021-03-05 DIAGNOSIS — K432 Incisional hernia without obstruction or gangrene: Secondary | ICD-10-CM | POA: Diagnosis present

## 2021-03-05 DIAGNOSIS — Z01810 Encounter for preprocedural cardiovascular examination: Secondary | ICD-10-CM

## 2021-03-05 LAB — COMPREHENSIVE METABOLIC PANEL
ALT: 30 U/L (ref 0–44)
AST: 66 U/L — ABNORMAL HIGH (ref 15–41)
Albumin: 3.6 g/dL (ref 3.5–5.0)
Alkaline Phosphatase: 197 U/L — ABNORMAL HIGH (ref 38–126)
Anion gap: 12 (ref 5–15)
BUN: 12 mg/dL (ref 8–23)
CO2: 26 mmol/L (ref 22–32)
Calcium: 8.8 mg/dL — ABNORMAL LOW (ref 8.9–10.3)
Chloride: 99 mmol/L (ref 98–111)
Creatinine, Ser: 0.58 mg/dL (ref 0.44–1.00)
GFR, Estimated: >60 mL/min (ref >60)
Glucose, Bld: 91 mg/dL (ref 70–99)
Potassium: 3.9 mmol/L (ref 3.5–5.1)
Sodium: 137 mmol/L (ref 135–145)
Total Bilirubin: 0.7 mg/dL (ref 0.3–1.2)
Total Protein: 7.2 g/dL (ref 6.5–8.1)

## 2021-03-07 ENCOUNTER — Encounter: Payer: Self-pay | Admitting: Surgery

## 2021-03-08 ENCOUNTER — Encounter: Payer: Self-pay | Admitting: Surgery

## 2021-03-08 ENCOUNTER — Ambulatory Visit: Payer: Federal, State, Local not specified - PPO | Admitting: Certified Registered"

## 2021-03-08 ENCOUNTER — Ambulatory Visit
Admission: RE | Admit: 2021-03-08 | Discharge: 2021-03-08 | Disposition: A | Discharge status: Home / Self Care | Payer: Federal, State, Local not specified - PPO | Attending: Surgery | Admitting: Surgery

## 2021-03-08 ENCOUNTER — Encounter
Admission: RE | Disposition: A | Discharge status: Home / Self Care | Payer: Self-pay | Source: Home / Self Care | Attending: Surgery

## 2021-03-08 ENCOUNTER — Other Ambulatory Visit: Payer: Self-pay

## 2021-03-08 ENCOUNTER — Ambulatory Visit: Payer: Federal, State, Local not specified - PPO | Admitting: Urgent Care

## 2021-03-08 DIAGNOSIS — Z885 Allergy status to narcotic agent status: Secondary | ICD-10-CM | POA: Insufficient documentation

## 2021-03-08 DIAGNOSIS — Z9049 Acquired absence of other specified parts of digestive tract: Secondary | ICD-10-CM | POA: Insufficient documentation

## 2021-03-08 DIAGNOSIS — Z884 Allergy status to anesthetic agent status: Secondary | ICD-10-CM | POA: Insufficient documentation

## 2021-03-08 DIAGNOSIS — Z888 Allergy status to other drugs, medicaments and biological substances status: Secondary | ICD-10-CM | POA: Insufficient documentation

## 2021-03-08 DIAGNOSIS — K432 Incisional hernia without obstruction or gangrene: Secondary | ICD-10-CM | POA: Insufficient documentation

## 2021-03-08 DIAGNOSIS — F17210 Nicotine dependence, cigarettes, uncomplicated: Secondary | ICD-10-CM | POA: Insufficient documentation

## 2021-03-08 DIAGNOSIS — Z9884 Bariatric surgery status: Secondary | ICD-10-CM | POA: Insufficient documentation

## 2021-03-08 HISTORY — PX: UMBILICAL HERNIA REPAIR: SHX196

## 2021-03-08 LAB — URINE DRUG SCREEN, QUALITATIVE (ARMC ONLY)
Amphetamines, Ur Screen: NOT DETECTED
Barbiturates, Ur Screen: NOT DETECTED
Benzodiazepine, Ur Scrn: NOT DETECTED
Cannabinoid 50 Ng, Ur ~~LOC~~: NOT DETECTED
Cocaine Metabolite,Ur ~~LOC~~: NOT DETECTED
MDMA (Ecstasy)Ur Screen: NOT DETECTED
Methadone Scn, Ur: NOT DETECTED
Opiate, Ur Screen: NOT DETECTED
Phencyclidine (PCP) Ur S: NOT DETECTED
Tricyclic, Ur Screen: NOT DETECTED

## 2021-03-08 SURGERY — REPAIR, HERNIA, UMBILICAL, ADULT
Anesthesia: General

## 2021-03-08 MED ORDER — CHLORHEXIDINE GLUCONATE 0.12 % MT SOLN: 15.0000 mL | Freq: Once | OROMUCOSAL | Status: AC

## 2021-03-08 MED ORDER — HYDROCODONE-ACETAMINOPHEN 5-325 MG PO TABS: 1.0000 | ORAL_TABLET | Freq: Four times a day (QID) | ORAL | 0 refills | Status: AC | PRN

## 2021-03-08 MED ORDER — FENTANYL CITRATE (PF) 100 MCG/2ML IJ SOLN
INTRAMUSCULAR | Status: AC
  Administered 2021-03-08: 25 ug via INTRAVENOUS
  Filled 2021-03-08: qty 2

## 2021-03-08 MED ORDER — HYDROCODONE-ACETAMINOPHEN 5-325 MG PO TABS
ORAL_TABLET | ORAL | Status: AC
  Filled 2021-03-08: qty 1

## 2021-03-08 MED ORDER — GABAPENTIN 300 MG PO CAPS
ORAL_CAPSULE | ORAL | Status: AC
  Administered 2021-03-08: 300 mg via ORAL
  Filled 2021-03-08: qty 1

## 2021-03-08 MED ORDER — ONDANSETRON HCL 4 MG/2ML IJ SOLN
INTRAMUSCULAR | Status: AC
  Filled 2021-03-08: qty 2

## 2021-03-08 MED ORDER — GABAPENTIN 300 MG PO CAPS: 300.0000 mg | ORAL_CAPSULE | ORAL | Status: AC

## 2021-03-08 MED ORDER — VASOPRESSIN 20 UNIT/ML IV SOLN
INTRAVENOUS | Status: DC | PRN
  Administered 2021-03-08: 1 [IU] via INTRAVENOUS

## 2021-03-08 MED ORDER — CHLORHEXIDINE GLUCONATE CLOTH 2 % EX PADS: 6.0000 | MEDICATED_PAD | Freq: Once | CUTANEOUS | Status: DC

## 2021-03-08 MED ORDER — PHENYLEPHRINE HCL (PRESSORS) 10 MG/ML IV SOLN
INTRAVENOUS | Status: DC | PRN
  Administered 2021-03-08 (×3): 100 ug via INTRAVENOUS

## 2021-03-08 MED ORDER — ACETAMINOPHEN 500 MG PO TABS: 1000.0000 mg | ORAL_TABLET | ORAL | Status: AC

## 2021-03-08 MED ORDER — CELECOXIB 200 MG PO CAPS
200.0000 mg | ORAL_CAPSULE | ORAL | Status: AC
Start: 2021-03-08 — End: 2021-03-08

## 2021-03-08 MED ORDER — DEXAMETHASONE SODIUM PHOSPHATE 10 MG/ML IJ SOLN
INTRAMUSCULAR | Status: DC | PRN
  Administered 2021-03-08: 4 mg via INTRAVENOUS

## 2021-03-08 MED ORDER — LIDOCAINE HCL (PF) 2 % IJ SOLN
INTRAMUSCULAR | Status: AC
  Filled 2021-03-08: qty 5

## 2021-03-08 MED ORDER — BUPIVACAINE-EPINEPHRINE (PF) 0.25% -1:200000 IJ SOLN
INTRAMUSCULAR | Status: AC
  Filled 2021-03-08: qty 30

## 2021-03-08 MED ORDER — FENTANYL CITRATE (PF) 100 MCG/2ML IJ SOLN
25.0000 ug | INTRAMUSCULAR | Status: AC | PRN
  Administered 2021-03-08 (×4): 25 ug via INTRAVENOUS

## 2021-03-08 MED ORDER — ACETAMINOPHEN 500 MG PO TABS
ORAL_TABLET | ORAL | Status: AC
  Administered 2021-03-08: 1000 mg via ORAL
  Filled 2021-03-08: qty 2

## 2021-03-08 MED ORDER — FENTANYL CITRATE (PF) 100 MCG/2ML IJ SOLN
INTRAMUSCULAR | Status: DC | PRN
  Administered 2021-03-08 (×2): 50 ug via INTRAVENOUS

## 2021-03-08 MED ORDER — CEFAZOLIN SODIUM-DEXTROSE 2-4 GM/100ML-% IV SOLN
INTRAVENOUS | Status: AC
  Filled 2021-03-08: qty 100

## 2021-03-08 MED ORDER — CEFAZOLIN SODIUM-DEXTROSE 2-4 GM/100ML-% IV SOLN
2.0000 g | INTRAVENOUS | Status: AC
  Administered 2021-03-08: 2 g via INTRAVENOUS

## 2021-03-08 MED ORDER — PROPOFOL 10 MG/ML IV BOLUS
INTRAVENOUS | Status: DC | PRN
  Administered 2021-03-08: 150 mg via INTRAVENOUS
  Administered 2021-03-08: 50 mg via INTRAVENOUS

## 2021-03-08 MED ORDER — BUPIVACAINE LIPOSOME 1.3 % IJ SUSP
INTRAMUSCULAR | Status: AC
  Filled 2021-03-08: qty 20

## 2021-03-08 MED ORDER — CELECOXIB 200 MG PO CAPS
ORAL_CAPSULE | ORAL | Status: AC
  Administered 2021-03-08: 200 mg via ORAL
  Filled 2021-03-08: qty 1

## 2021-03-08 MED ORDER — BUPIVACAINE-EPINEPHRINE (PF) 0.25% -1:200000 IJ SOLN
INTRAMUSCULAR | Status: DC | PRN
  Administered 2021-03-08: 30 mL

## 2021-03-08 MED ORDER — CHLORHEXIDINE GLUCONATE 0.12 % MT SOLN
OROMUCOSAL | Status: AC
  Administered 2021-03-08: 15 mL via OROMUCOSAL
  Filled 2021-03-08: qty 15

## 2021-03-08 MED ORDER — SUCCINYLCHOLINE CHLORIDE 200 MG/10ML IV SOSY
PREFILLED_SYRINGE | INTRAVENOUS | Status: AC
  Filled 2021-03-08: qty 10

## 2021-03-08 MED ORDER — LIDOCAINE HCL (CARDIAC) PF 100 MG/5ML IV SOSY
PREFILLED_SYRINGE | INTRAVENOUS | Status: DC | PRN
  Administered 2021-03-08: 80 mg via INTRAVENOUS

## 2021-03-08 MED ORDER — SUGAMMADEX SODIUM 200 MG/2ML IV SOLN
INTRAVENOUS | Status: DC | PRN
  Administered 2021-03-08: 150 mg via INTRAVENOUS

## 2021-03-08 MED ORDER — ROCURONIUM BROMIDE 100 MG/10ML IV SOLN
INTRAVENOUS | Status: DC | PRN
  Administered 2021-03-08: 50 mg via INTRAVENOUS

## 2021-03-08 MED ORDER — FENTANYL CITRATE (PF) 100 MCG/2ML IJ SOLN
INTRAMUSCULAR | Status: AC
  Filled 2021-03-08: qty 2

## 2021-03-08 MED ORDER — ONDANSETRON HCL 4 MG/2ML IJ SOLN
INTRAMUSCULAR | Status: DC | PRN
  Administered 2021-03-08: 4 mg via INTRAVENOUS

## 2021-03-08 MED ORDER — LACTATED RINGERS IV SOLN: INTRAVENOUS | Status: DC

## 2021-03-08 MED ORDER — PROPOFOL 10 MG/ML IV BOLUS
INTRAVENOUS | Status: AC
  Filled 2021-03-08: qty 40

## 2021-03-08 MED ORDER — ORAL CARE MOUTH RINSE: 15.0000 mL | Freq: Once | OROMUCOSAL | Status: AC

## 2021-03-08 MED ORDER — DEXAMETHASONE SODIUM PHOSPHATE 10 MG/ML IJ SOLN
INTRAMUSCULAR | Status: AC
  Filled 2021-03-08: qty 1

## 2021-03-08 MED ORDER — PHENYLEPHRINE HCL (PRESSORS) 10 MG/ML IV SOLN
INTRAVENOUS | Status: AC
  Filled 2021-03-08: qty 1

## 2021-03-08 MED ORDER — HYDROCODONE-ACETAMINOPHEN 5-325 MG PO TABS
1.0000 | ORAL_TABLET | Freq: Once | ORAL | Status: AC
  Administered 2021-03-08: 1 via ORAL

## 2021-03-08 MED ORDER — BUPIVACAINE LIPOSOME 1.3 % IJ SUSP
INTRAMUSCULAR | Status: DC | PRN
  Administered 2021-03-08: 20 mL

## 2021-03-08 MED ORDER — ROCURONIUM BROMIDE 10 MG/ML (PF) SYRINGE
PREFILLED_SYRINGE | INTRAVENOUS | Status: AC
  Filled 2021-03-08: qty 10

## 2021-03-08 MED ORDER — SUCCINYLCHOLINE CHLORIDE 20 MG/ML IJ SOLN
INTRAMUSCULAR | Status: DC | PRN
  Administered 2021-03-08: 100 mg via INTRAVENOUS

## 2021-03-08 MED ORDER — PROMETHAZINE HCL 25 MG/ML IJ SOLN: 6.2500 mg | INTRAMUSCULAR | Status: DC | PRN

## 2021-03-08 SURGICAL SUPPLY — 34 items
ADH SKN CLS APL DERMABOND .7 (GAUZE/BANDAGES/DRESSINGS) ×1
APL PRP STRL LF DISP 70% ISPRP (MISCELLANEOUS) ×1
APPLIER CLIP 11 MED OPEN (CLIP)
APR CLP MED 11 20 MLT OPN (CLIP)
BINDER ABDOMINAL 12 ML 46-62 (SOFTGOODS) ×3 IMPLANT
BLADE CLIPPER SURG (BLADE) IMPLANT
BLADE SURG 15 STRL LF DISP TIS (BLADE) ×1 IMPLANT
BLADE SURG 15 STRL SS (BLADE) ×3
CANISTER SUCT 1200ML W/VALVE (MISCELLANEOUS) IMPLANT
CHLORAPREP W/TINT 26 (MISCELLANEOUS) ×3 IMPLANT
CLIP APPLIE 11 MED OPEN (CLIP) IMPLANT
DERMABOND ADVANCED (GAUZE/BANDAGES/DRESSINGS) ×2
DERMABOND ADVANCED .7 DNX12 (GAUZE/BANDAGES/DRESSINGS) ×1 IMPLANT
DRAPE INCISE IOBAN 66X45 STRL (DRAPES) ×3 IMPLANT
DRAPE LAPAROTOMY 77X122 PED (DRAPES) ×3 IMPLANT
DRSG TELFA 3X8 NADH (GAUZE/BANDAGES/DRESSINGS) IMPLANT
ELECT CAUTERY BLADE 6.4 (BLADE) ×3 IMPLANT
ELECT REM PT RETURN 9FT ADLT (ELECTROSURGICAL) ×3
ELECTRODE REM PT RTRN 9FT ADLT (ELECTROSURGICAL) ×1 IMPLANT
GAUZE 4X4 16PLY ~~LOC~~+RFID DBL (SPONGE) IMPLANT
GLOVE SURG ENC MOIS LTX SZ7 (GLOVE) ×3 IMPLANT
GOWN STRL REUS W/ TWL LRG LVL3 (GOWN DISPOSABLE) ×3 IMPLANT
GOWN STRL REUS W/TWL LRG LVL3 (GOWN DISPOSABLE) ×9
MANIFOLD NEPTUNE II (INSTRUMENTS) ×3 IMPLANT
MESH VENTRALEX ST 8CM LRG (Mesh General) ×3 IMPLANT
NEEDLE HYPO 22GX1.5 SAFETY (NEEDLE) ×3 IMPLANT
NS IRRIG 500ML POUR BTL (IV SOLUTION) ×3 IMPLANT
PACK BASIN MINOR ARMC (MISCELLANEOUS) ×3 IMPLANT
SPONGE T-LAP 18X18 ~~LOC~~+RFID (SPONGE) ×3 IMPLANT
SUT ETHIBOND NAB MO 7 #0 18IN (SUTURE) ×6 IMPLANT
SUT MNCRL AB 4-0 PS2 18 (SUTURE) ×3 IMPLANT
SUT VIC AB 3-0 SH 27 (SUTURE) ×9
SUT VIC AB 3-0 SH 27X BRD (SUTURE) ×3 IMPLANT
SYR 20ML LL LF (SYRINGE) ×3 IMPLANT

## 2021-03-08 NOTE — Anesthesia Postprocedure Evaluation (Signed)
Anesthesia Post Note  Patient: Carrie Hardy  Procedure(s) Performed: HERNIA REPAIR UMBILICAL ADULT, open WITH MESH  Patient location during evaluation: PACU Anesthesia Type: General Level of consciousness: awake and alert Pain management: pain level controlled Vital Signs Assessment: post-procedure vital signs reviewed and stable Respiratory status: spontaneous breathing, nonlabored ventilation, respiratory function stable and patient connected to nasal cannula oxygen Cardiovascular status: blood pressure returned to baseline and stable Postop Assessment: no apparent nausea or vomiting Anesthetic complications: no   No notable events documented.   Last Vitals:  Vitals:   03/08/21 0948 03/08/21 1002  BP: 108/71 132/78  Pulse: 89 (!) 2  Resp: 16 16  Temp: 36.8 C 36.9 C  SpO2: 100% 100%    Last Pain:  Vitals:   03/08/21 1002  TempSrc: Temporal  PainSc: 2                  Martha Clan

## 2021-03-08 NOTE — Anesthesia Preprocedure Evaluation (Signed)
Anesthesia Evaluation  Patient identified by MRN, date of birth, ID band Patient awake    Reviewed: Allergy & Precautions, H&P , NPO status , reviewed documented beta blocker date and time   History of Anesthesia Complications Negative for: history of anesthetic complications  Airway Mallampati: II  TM Distance: >3 FB Neck ROM: full    Dental  (+) Partial Upper, Partial Lower, Chipped, Missing, Caps   Pulmonary neg shortness of breath, neg COPD, neg recent URI, Current Smoker and Patient abstained from smoking.,    Pulmonary exam normal        Cardiovascular hypertension, (-) angina(-) Past MI and (-) Cardiac Stents Normal cardiovascular exam(-) dysrhythmias (-) Valvular Problems/Murmurs  06/2020 ECHO IMPRESSIONS    1. Left ventricular ejection fraction, by estimation, is 35 to 40%. The  left ventricle has moderately decreased function. The left ventricle  demonstrates global hypokinesis. There is mild left ventricular  hypertrophy. Left ventricular diastolic  parameters are consistent with Grade I diastolic dysfunction (impaired  relaxation).  2. Right ventricular systolic function is low normal. The right  ventricular size is normal. There is normal pulmonary artery systolic  pressure.  3. Left atrial size was severely dilated.  4. Right atrial size was mildly dilated.  5. The mitral valve is normal in structure. Mild to moderate mitral valve  regurgitation. No evidence of mitral stenosis.  6. The aortic valve is tricuspid. Aortic valve regurgitation is mild. No  aortic stenosis is present.  Pt able to walk/climb stairs w/o DOE/CP   Neuro/Psych negative neurological ROS  negative psych ROS   GI/Hepatic GERD  Medicated and Controlled,NAFLD   Endo/Other    Renal/GU Renal disease     Musculoskeletal   Abdominal   Peds  Hematology   Anesthesia Other Findings Past Medical History: No date:  Hypertension Past Surgical History: 08/11/2020: IR CATHETER TUBE CHANGE   Reproductive/Obstetrics                             Anesthesia Physical  Anesthesia Plan  ASA: 2  Anesthesia Plan: General   Post-op Pain Management:    Induction: Intravenous  PONV Risk Score and Plan: 3 and Ondansetron, Treatment may vary due to age or medical condition and Dexamethasone  Airway Management Planned: Oral ETT and LMA  Additional Equipment:   Intra-op Plan:   Post-operative Plan: Extubation in OR  Informed Consent: I have reviewed the patients History and Physical, chart, labs and discussed the procedure including the risks, benefits and alternatives for the proposed anesthesia with the patient or authorized representative who has indicated his/her understanding and acceptance.     Dental Advisory Given  Plan Discussed with: CRNA  Anesthesia Plan Comments:         Anesthesia Quick Evaluation

## 2021-03-08 NOTE — Discharge Instructions (Signed)

## 2021-03-08 NOTE — Interval H&P Note (Signed)
History and Physical Interval Note:  03/08/2021 7:20 AM  Carrie Hardy  has presented today for surgery, with the diagnosis of incisional hernia.  The various methods of treatment have been discussed with the patient and family. After consideration of risks, benefits and other options for treatment, the patient has consented to  Procedure(s): HERNIA REPAIR UMBILICAL ADULT, open (N/A) as a surgical intervention.  The patient's history has been reviewed, patient examined, no change in status, stable for surgery.  I have reviewed the patient's chart and labs.  Questions were answered to the patient's satisfaction.     [place]

## 2021-03-08 NOTE — Transfer of Care (Signed)
Immediate Anesthesia Transfer of Care Note  Patient: Carrie Hardy  Procedure(s) Performed: HERNIA REPAIR UMBILICAL ADULT, open WITH MESH  Patient Location: PACU  Anesthesia Type:General  Level of Consciousness: awake, alert  and oriented  Airway & Oxygen Therapy: Patient Spontanous Breathing and Patient connected to face mask oxygen  Post-op Assessment: Report given to RN and Post -op Vital signs reviewed and stable  Post vital signs: Reviewed and stable  Last Vitals:  Vitals Value Taken Time  BP 103/64 03/08/21 0856  Temp    Pulse 84 03/08/21 0858  Resp    SpO2 100 % 03/08/21 0858  Vitals shown include unvalidated device data.  Last Pain:  Vitals:   03/08/21 0633  TempSrc: Temporal  PainSc: 0-No pain         Complications: No notable events documented.

## 2021-03-08 NOTE — Anesthesia Procedure Notes (Signed)
Procedure Name: Intubation Date/Time: 03/08/2021 7:46 AM Performed by: Johney Maine, CRNA Pre-anesthesia Checklist: Patient identified, Patient being monitored, Timeout performed, Emergency Drugs available and Suction available Patient Re-evaluated:Patient Re-evaluated prior to induction Oxygen Delivery Method: Circle system utilized Preoxygenation: Pre-oxygenation with 100% oxygen Induction Type: IV induction Ventilation: Mask ventilation without difficulty Laryngoscope Size: Miller and 2 Grade View: Grade I Tube type: Oral Tube size: 7.0 mm Number of attempts: 1 Placement Confirmation: ETT inserted through vocal cords under direct vision, positive ETCO2 and breath sounds checked- equal and bilateral Secured at: 21 cm Tube secured with: Tape Dental Injury: Teeth and Oropharynx as per pre-operative assessment

## 2021-03-08 NOTE — Op Note (Signed)
Incisional Hernia Repair with Mesh  Pre-operative Diagnosis: Incisional hernia hernia  Post-operative Diagnosis: same  Surgeon: Caroleen Hamman, MD FACS  Anesthesia: Gen. with endotracheal tube   Findings: 3  cm incisional hernia periumbilical   Estimated Blood Loss: 10cc                Specimens: sac          Complications: none              Procedure Details  The patient was seen again in the Holding Room. The benefits, complications, treatment options, and expected outcomes were discussed with the patient. The risks of bleeding, infection, recurrence of symptoms, failure to resolve symptoms, bowel injury, mesh placement, mesh infection, any of which could require further surgery were reviewed with the patient. The likelihood of improving the patient's symptoms with return to their baseline status is good.  The patient and/or family concurred with the proposed plan, giving informed consent.  The patient was taken to Operating Room, identified and the procedure verified.  A Time Out was held and the above information confirmed.  Prior to the induction of general anesthesia, antibiotic prophylaxis was administered. VTE prophylaxis was in place. General endotracheal anesthesia was then administered and tolerated well. After the induction, the abdomen was prepped with Chloraprep and draped in the sterile fashion. The patient was positioned in the supine position.   Incision was created with a scalpel over the hernia defect. Electrocautery was used to dissect through subcutaneous tissue, the hernia sac was opened excised.   The hernia was measured.A BARD 8cm Ventralex patch selected and secure to the fascia in four quadrants.. I closed the hernia defect with interrupted 0 Ethibond sutures.   Incision was closed in a 2 layer fashion with 3-0 Vicryl and 4-0 Monocryl. Dermabond was used to coat the skin. Marcaine quarter percent with epinephrine and lidocaine 1% was used to inject all the  incision sites. Patient tolerated procedure well and there were no immediate complications. Needle and laparotomy counts were correct

## 2021-03-09 LAB — SURGICAL PATHOLOGY

## 2021-03-13 IMAGING — XA IR CATHETER TUBE CHANGE
4 series · 10 of 10 positions shown · non-contrast
Comparison: none

Addendum:
INDICATION: 59-year-old with history of cholecystitis. Percutaneous
cholecystostomy tube was placed on 06/28/2020. Patient presents for
cholangiogram and tube exchange.

[Series 1: fluoroscopy - stored · 4 of 207 frames shown (1 of 3)]
[frame 19/207]
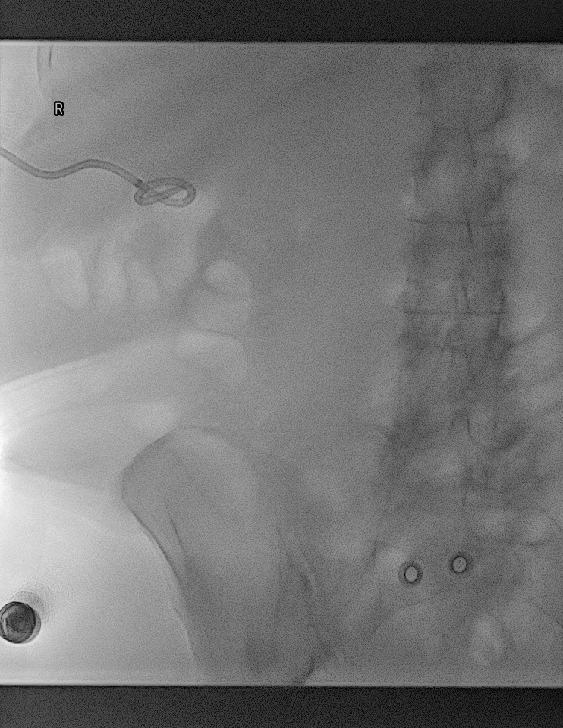
[frame 32/207]
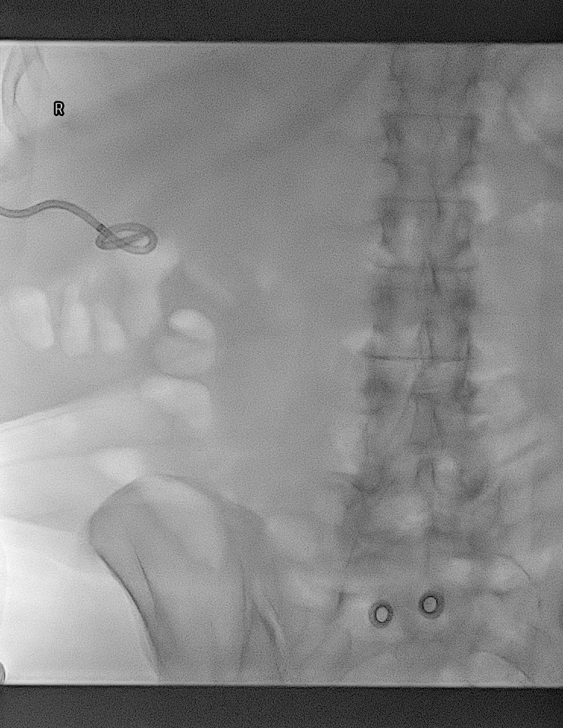
[frame 104/207]
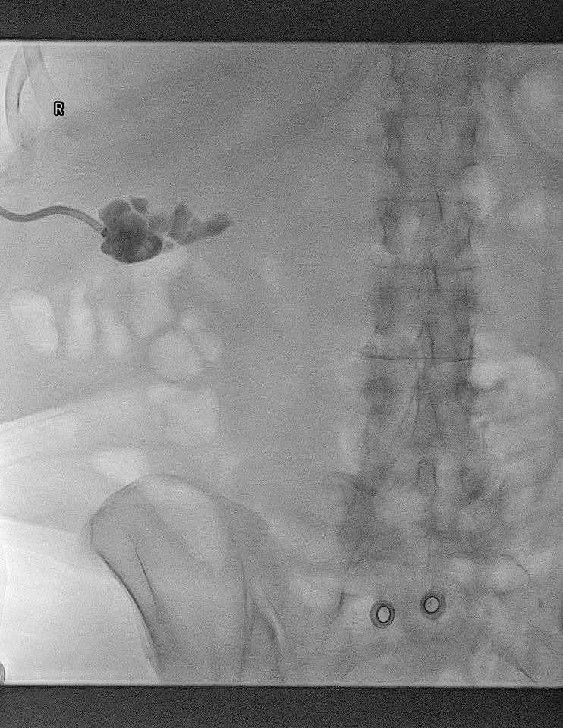
[frame 176/207]
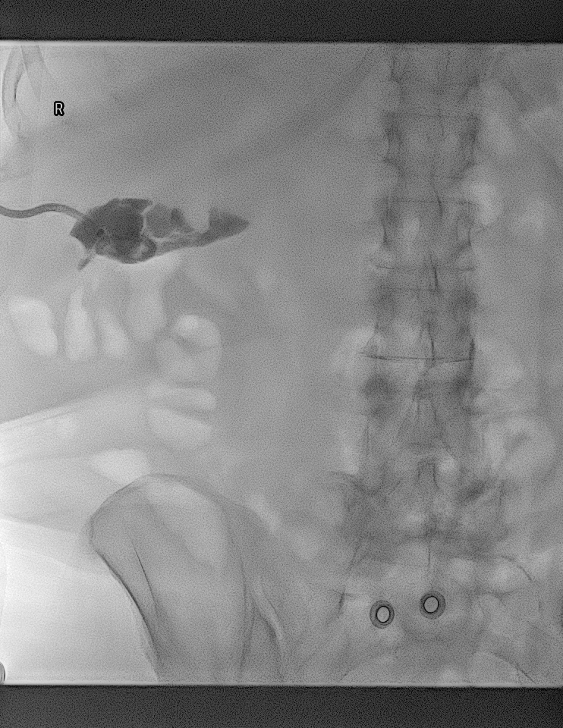

[Series 1: fluoroscopy - stored · 1 of 1 slices shown (2 of 3)]
[im 1/1]
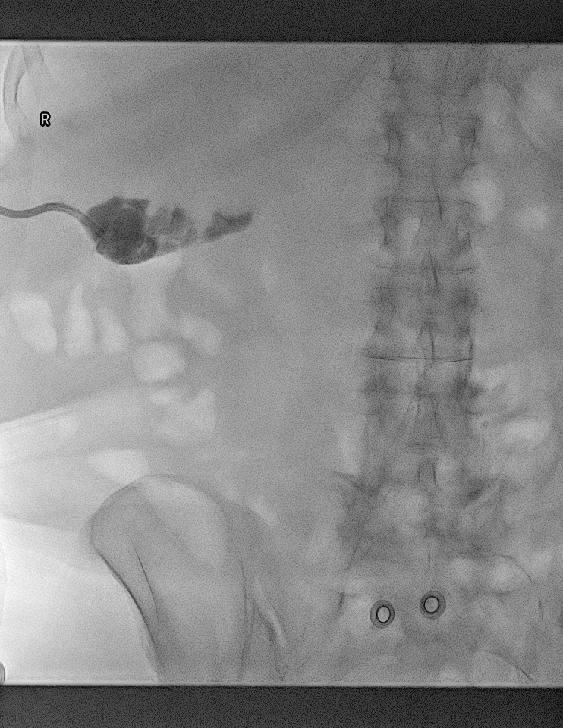

[Series 2: abdomen 4 fps · 1 of 1 slices shown]
[im 1/1]
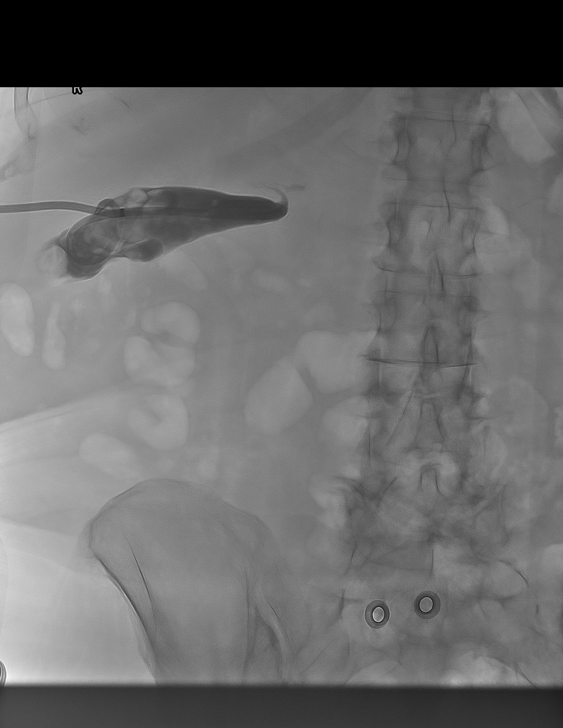

[Series 3: fluoroscopy - stored · 4 of 78 frames shown (3 of 3)]
[frame 1/78]
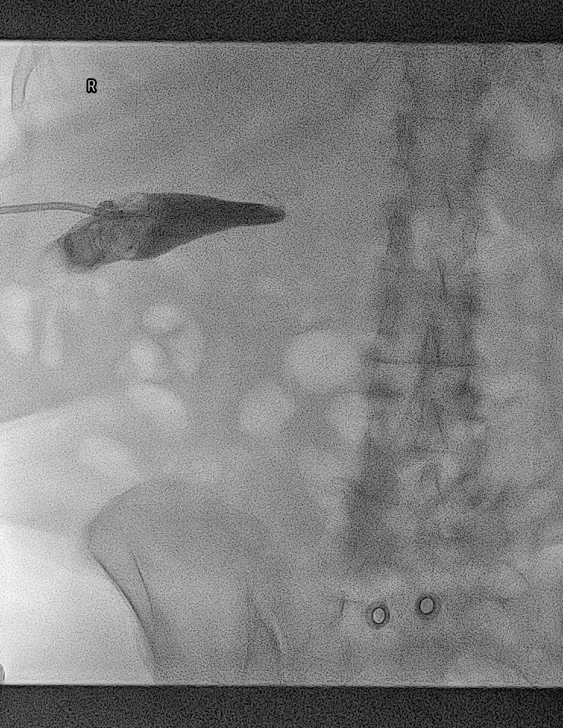
[frame 12/78]
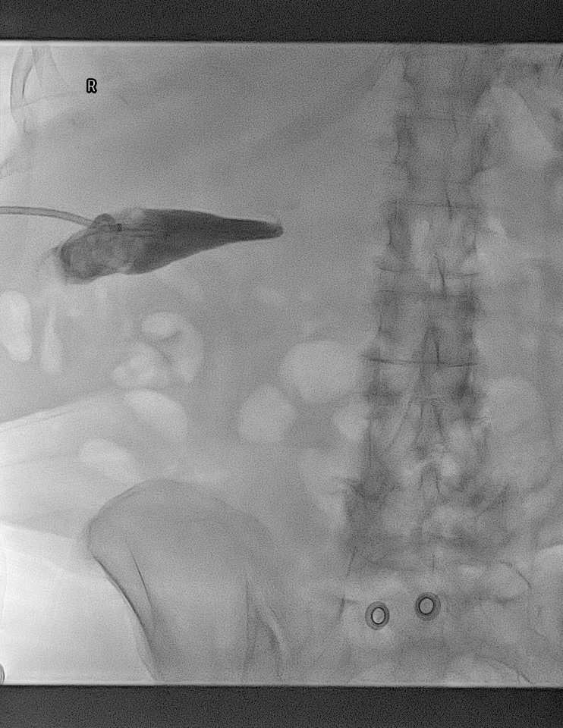
[frame 40/78]
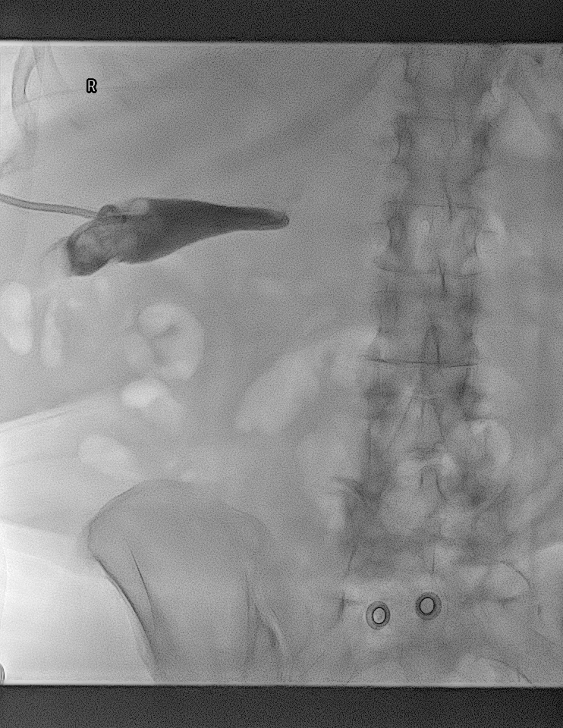
[frame 67/78]
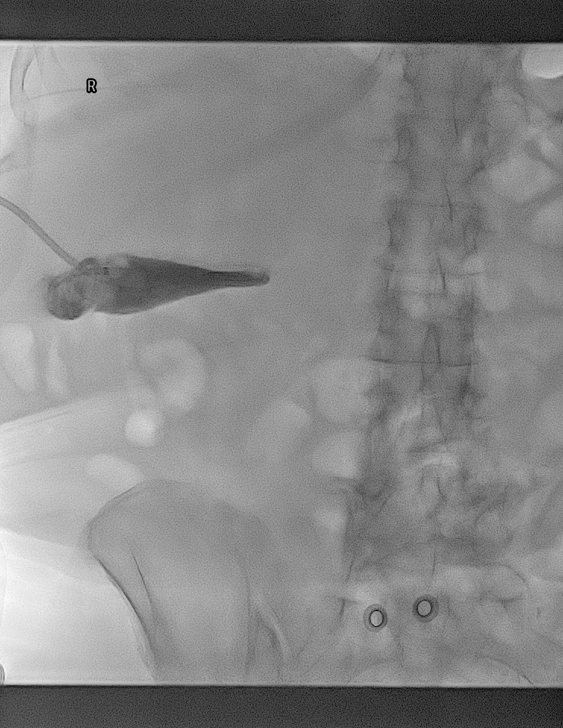

[10 of 10 positions shown; findings below may reference images not displayed]

EXAM:
1. CHOLANGIOGRAM THROUGH EXISTING CATHETER
2. EXCHANGE OF CHOLECYSTOSTOMY TUBE WITH FLUOROSCOPY

MEDICATIONS:
None

ANESTHESIA/SEDATION:
None

COMPLICATIONS:
None immediate.

PROCEDURE:
Informed written consent was obtained from the patient after a
thorough discussion of the procedural risks, benefits and
alternatives. All questions were addressed. Maximal Sterile Barrier
Technique was utilized including caps, mask, sterile gowns, sterile
gloves, sterile drape, hand hygiene and skin antiseptic. A timeout
was performed prior to the initiation of the procedure.

The cholecystostomy tube was injected with contrast. The catheter
and suture were cut. The catheter was removed over a Bentson wire
and new 10 French multipurpose drain was placed. The catheter was
positioned within the gallbladder. Additional contrast was injected.
Catheter was flushed with saline and attached to a gravity bag. Skin
was anesthetized with 1% lidocaine and the catheter was sutured to
skin.

Fluoroscopic images were taken and saved for this procedure.
FINDINGS: New 10 French drain is positioned in the gallbladder. Filling
defects in the gallbladder are suggestive for stones and sludge.
Minimal filling of the cystic duct but no filling of the common bile
duct.
IMPRESSION: 1. Successful exchange of the cholecystostomy tube. Filling defects
in the gallbladder are suggestive for stones and sludge.
2. Minimal filling of the cystic duct. No filling of the common bile
duct.

FLUOROSCOPY TIME:  1 minute, 24 seconds, 7.1 mGy

*** End of Addendum ***
EXAM:
1. CHOLANGIOGRAM THROUGH EXISTING CATHETER
2. EXCHANGE OF CHOLECYSTOSTOMY TUBE WITH FLUOROSCOPY

MEDICATIONS:
None

ANESTHESIA/SEDATION:
None

COMPLICATIONS:
None immediate.

PROCEDURE:
Informed written consent was obtained from the patient after a
thorough discussion of the procedural risks, benefits and
alternatives. All questions were addressed. Maximal Sterile Barrier
Technique was utilized including caps, mask, sterile gowns, sterile
gloves, sterile drape, hand hygiene and skin antiseptic. A timeout
was performed prior to the initiation of the procedure.

The cholecystostomy tube was injected with contrast. The catheter
and suture were cut. The catheter was removed over a Bentson wire
and new 10 French multipurpose drain was placed. The catheter was
positioned within the gallbladder. Additional contrast was injected.
Catheter was flushed with saline and attached to a gravity bag. Skin
was anesthetized with 1% lidocaine and the catheter was sutured to
skin.

Fluoroscopic images were taken and saved for this procedure.
FINDINGS: New 10 French drain is positioned in the gallbladder. Filling
defects in the gallbladder are suggestive for stones and sludge.
Minimal filling of the cystic duct but no filling of the common bile
duct.
IMPRESSION: 1. Successful exchange of the cholecystostomy tube. Filling defects
in the gallbladder are suggestive for stones and sludge.
2. Minimal filling of the cystic duct. No filling of the common bile
duct.

## 2021-03-28 ENCOUNTER — Other Ambulatory Visit: Payer: Self-pay

## 2021-03-28 ENCOUNTER — Ambulatory Visit (INDEPENDENT_AMBULATORY_CARE_PROVIDER_SITE_OTHER): Payer: Federal, State, Local not specified - PPO | Admitting: Surgery

## 2021-03-28 ENCOUNTER — Encounter: Payer: Self-pay | Admitting: Surgery

## 2021-03-28 VITALS — BP 163/99 | HR 105 | Temp 98.7°F | Ht 64.0 in | Wt 164.0 lb

## 2021-03-28 DIAGNOSIS — Z09 Encounter for follow-up examination after completed treatment for conditions other than malignant neoplasm: Secondary | ICD-10-CM

## 2021-03-28 NOTE — Progress Notes (Signed)
S/p incisional hernia repair mesh.  Some soreness on the months.  No fevers no chills tolerating p.o.  PE NAD Abd: soft, incision c/d/I, no infection or recurrence  A/P Doing well w/o complications She wishes to f/u in a few months

## 2021-03-28 NOTE — Patient Instructions (Addendum)

## 2021-07-04 ENCOUNTER — Ambulatory Visit: Payer: Federal, State, Local not specified - PPO | Admitting: Surgery

## 2022-05-10 ENCOUNTER — Other Ambulatory Visit: Payer: Self-pay | Admitting: Family Medicine

## 2022-05-10 DIAGNOSIS — F17210 Nicotine dependence, cigarettes, uncomplicated: Secondary | ICD-10-CM

## 2022-05-10 DIAGNOSIS — F17200 Nicotine dependence, unspecified, uncomplicated: Secondary | ICD-10-CM

## 2024-09-07 ENCOUNTER — Emergency Department
Admission: EM | Admit: 2024-09-07 | Discharge: 2024-09-07 | Discharge status: Against Medical Advice | Attending: Emergency Medicine | Admitting: Emergency Medicine

## 2024-09-07 ENCOUNTER — Other Ambulatory Visit: Payer: Self-pay

## 2024-09-07 DIAGNOSIS — Z5329 Procedure and treatment not carried out because of patient's decision for other reasons: Secondary | ICD-10-CM | POA: Insufficient documentation

## 2024-09-07 DIAGNOSIS — S0181XA Laceration without foreign body of other part of head, initial encounter: Secondary | ICD-10-CM | POA: Insufficient documentation

## 2024-09-07 DIAGNOSIS — X58XXXA Exposure to other specified factors, initial encounter: Secondary | ICD-10-CM | POA: Diagnosis not present

## 2024-09-07 NOTE — ED Triage Notes (Signed)
 BIB by [facility]. Tripped and fell in parking lot .  1 in lac to chin, submandibular area.  No LOC. No complaints. Bleeding controlled.  No thinners. VS wnl

## 2024-09-07 NOTE — ED Notes (Signed)
 This RN noticed about 20 mins prior to this note that pt was not in room. At this time pt is still not in room and there are no belongings in room. This RN checked bathrooms and still not found pt. Paulita, PA notified.
